# Patient Record
Sex: Male | Born: 1955
Health system: Southern US, Community
[De-identification: ages and names within clinical notes are randomized; demographics above are authoritative.]

## PROBLEM LIST (undated history)

## (undated) DIAGNOSIS — E119 Type 2 diabetes mellitus without complications: Secondary | ICD-10-CM

## (undated) DIAGNOSIS — I10 Essential (primary) hypertension: Secondary | ICD-10-CM

## (undated) DIAGNOSIS — E785 Hyperlipidemia, unspecified: Secondary | ICD-10-CM

---

## 1993-10-16 DIAGNOSIS — E119 Type 2 diabetes mellitus without complications: Secondary | ICD-10-CM

## 1993-10-16 HISTORY — DX: Type 2 diabetes mellitus without complications: E11.9

## 2001-03-01 ENCOUNTER — Emergency Department (HOSPITAL_COMMUNITY): Admission: EM | Admit: 2001-03-01 | Discharge: 2001-03-01 | Payer: Self-pay | Admitting: Emergency Medicine

## 2001-06-17 ENCOUNTER — Emergency Department (HOSPITAL_COMMUNITY): Admission: EM | Admit: 2001-06-17 | Discharge: 2001-06-17 | Payer: Self-pay | Admitting: Emergency Medicine

## 2001-06-17 ENCOUNTER — Encounter: Payer: Self-pay | Admitting: Emergency Medicine

## 2002-08-08 ENCOUNTER — Encounter: Payer: Self-pay | Admitting: Internal Medicine

## 2002-08-08 ENCOUNTER — Ambulatory Visit (HOSPITAL_COMMUNITY): Admission: RE | Admit: 2002-08-08 | Discharge: 2002-08-08 | Payer: Self-pay | Admitting: Internal Medicine

## 2005-10-16 HISTORY — PX: CHOLECYSTECTOMY: SHX55

## 2006-02-12 ENCOUNTER — Emergency Department (HOSPITAL_COMMUNITY): Admission: EM | Admit: 2006-02-12 | Discharge: 2006-02-12 | Payer: Self-pay | Admitting: Emergency Medicine

## 2008-10-16 DIAGNOSIS — I10 Essential (primary) hypertension: Secondary | ICD-10-CM

## 2008-10-16 HISTORY — DX: Essential (primary) hypertension: I10

## 2010-01-03 ENCOUNTER — Encounter: Admission: RE | Admit: 2010-01-03 | Discharge: 2010-04-03 | Payer: Self-pay | Admitting: Internal Medicine

## 2011-10-17 DIAGNOSIS — E785 Hyperlipidemia, unspecified: Secondary | ICD-10-CM

## 2011-10-17 HISTORY — PX: EYE SURGERY: SHX253

## 2011-10-17 HISTORY — DX: Hyperlipidemia, unspecified: E78.5

## 2012-07-25 ENCOUNTER — Encounter (HOSPITAL_COMMUNITY)
Admission: RE | Admit: 2012-07-25 | Discharge: 2012-07-25 | Disposition: A | Discharge status: Home / Self Care | Payer: 59 | Source: Ambulatory Visit | Attending: Ophthalmology | Admitting: Ophthalmology

## 2012-07-25 ENCOUNTER — Encounter (HOSPITAL_COMMUNITY): Payer: Self-pay

## 2012-07-25 HISTORY — DX: Hyperlipidemia, unspecified: E78.5

## 2012-07-25 HISTORY — DX: Essential (primary) hypertension: I10

## 2012-07-25 HISTORY — DX: Type 2 diabetes mellitus without complications: E11.9

## 2012-07-25 LAB — BASIC METABOLIC PANEL
CO2: 28 mEq/L (ref 19–32)
Chloride: 99 mEq/L (ref 96–112)
Glucose, Bld: 120 mg/dL — ABNORMAL HIGH (ref 70–99)
Sodium: 137 mEq/L (ref 135–145)

## 2012-07-25 LAB — HEMOGLOBIN AND HEMATOCRIT, BLOOD: HCT: 37.9 % — ABNORMAL LOW (ref 39.0–52.0)

## 2012-07-25 NOTE — Patient Instructions (Addendum)
20 Travis Herrera  07/25/2012   Your procedure is scheduled on:  Monday,October 14  Report to [facility] [id number] AM.  Call this number if you have problems the morning of surgery: [phone]   Remember:   Do not eat food:After Midnight.  May have clear liquids:until Midnight .  Clear liquids include soda, tea, black coffee, apple or grape juice, broth.  Take these medicines the morning of surgery with A SIP OF WATER: Norvasc,Benazepril,Hydrochorothiazide   Do not wear jewelry, make-up or nail polish.  Do not wear lotions, powders, or perfumes. You may wear deodorant.  Do not shave 48 hours prior to surgery. Men may shave face and neck.  Do not bring valuables to the hospital.  Contacts, dentures or bridgework may not be worn into surgery.  Leave suitcase in the car. After surgery it may be brought to your room.  For patients admitted to the hospital, checkout time is 11:00 AM the day of discharge.   Patients discharged the day of surgery will not be allowed to drive home.  Name and phone number of your driver: Family and/or friend  Special Instructions: Shower using CHG 2 nights before surgery and the night before surgery.  If you shower the day of surgery use CHG.  Use special wash - you have one bottle of CHG for all showers.  You should use approximately 1/3 of the bottle for each shower.   Please read over the following fact sheets that you were given: Pain Booklet, Coughing and Deep Breathing, MRSA Information, Surgical Site Infection Prevention, Anesthesia Post-op Instructions and Care and Recovery After Surgery  PATIENT INSTRUCTIONS POST-ANESTHESIA  IMMEDIATELY FOLLOWING SURGERY:  Do not drive or operate machinery for the first twenty four hours after surgery.  Do not make any important decisions for twenty four hours after surgery or while taking narcotic pain medications or sedatives.  If you develop intractable nausea and vomiting or a severe headache please notify your doctor  immediately.  FOLLOW-UP:  Please make an appointment with your surgeon as instructed. You do not need to follow up with anesthesia unless specifically instructed to do so.  WOUND CARE INSTRUCTIONS (if applicable):  Keep a dry clean dressing on the anesthesia/puncture wound site if there is drainage.  Once the wound has quit draining you may leave it open to air.  Generally you should leave the bandage intact for twenty four hours unless there is drainage.  If the epidural site drains for more than 36-48 hours please call the anesthesia department.  QUESTIONS?:  Please feel free to call your physician or the hospital operator if you have any questions, and they will be happy to assist you.      Cataract A cataract is a clouding of the lens of the eye. When a lens becomes cloudy, vision is reduced based on the degree and nature of the clouding. Many cataracts reduce vision to some degree. Some cataracts make people more near-sighted as they develop. Other cataracts increase glare. Cataracts that are ignored and become worse can sometimes look white. The white color can be seen through the pupil. CAUSES   Aging. However, cataracts may occur at any age, even in newborns.  Certain drugs.  Trauma to the eye.  Certain diseases such as diabetes.  Specific eye diseases such as chronic inflammation inside the eye or a sudden attack of a rare form of glaucoma.  Inherited or acquired medical problems. SYMPTOMS   Gradual, progressive drop in vision in the  affected eye.  Severe, rapid visual loss. This most often happens when trauma is the cause. DIAGNOSIS  To detect a cataract, an eye doctor examines the lens. Cataracts are best diagnosed with an exam of the eyes with the pupils enlarged (dilated) by drops.  TREATMENT  For an early cataract, vision may improve by using different eyeglasses or stronger lighting. If that does not help your vision, surgery is the only effective treatment. A cataract  needs to be surgically removed when vision loss interferes with your everyday activities, such as driving, reading, or watching TV. A cataract may also have to be removed if it prevents examination or treatment of another eye problem. Surgery removes the cloudy lens and usually replaces it with a substitute lens (intraocular lens, IOL).  At a time when both you and your doctor agree, the cataract will be surgically removed. If you have cataracts in both eyes, only one is usually removed at a time. This allows the operated eye to heal and be out of danger from any possible problems after surgery (such as infection or poor wound healing). In rare cases, a cataract may be doing damage to your eye. In these cases, your caregiver may advise surgical removal right away. The vast majority of people who have cataract surgery have better vision afterward. HOME CARE INSTRUCTIONS  If you are not planning surgery, you may be asked to do the following:  Use different eyeglasses.  Use stronger or brighter lighting.  Ask your eye doctor about reducing your medicine dose or changing medicines if it is thought that a medicine caused your cataract. Changing medicines does not make the cataract go away on its own.  Become familiar with your surroundings. Poor vision can lead to injury. Avoid bumping into things on the affected side. You are at a higher risk for tripping or falling.  Exercise extreme care when driving or operating machinery.  Wear sunglasses if you are sensitive to bright light or experiencing problems with glare. SEEK IMMEDIATE MEDICAL CARE IF:   You have a worsening or sudden vision loss.  You notice redness, swelling, or increasing pain in the eye.  You have a fever.

## 2012-07-26 MED ORDER — LIDOCAINE HCL (PF) 1 % IJ SOLN
INTRAMUSCULAR | Status: AC
  Filled 2012-07-26: qty 2

## 2012-07-26 MED ORDER — NEOMYCIN-POLYMYXIN-DEXAMETH 3.5-10000-0.1 OP OINT
TOPICAL_OINTMENT | OPHTHALMIC | Status: AC
  Filled 2012-07-26: qty 3.5

## 2012-07-26 MED ORDER — TETRACAINE HCL 0.5 % OP SOLN
OPHTHALMIC | Status: AC
  Filled 2012-07-26: qty 2

## 2012-07-26 MED ORDER — PHENYLEPHRINE HCL 2.5 % OP SOLN
OPHTHALMIC | Status: AC
  Filled 2012-07-26: qty 2

## 2012-07-26 MED ORDER — LIDOCAINE HCL 3.5 % OP GEL
OPHTHALMIC | Status: AC
  Filled 2012-07-26: qty 5

## 2012-07-26 MED ORDER — CYCLOPENTOLATE HCL 1 % OP SOLN
OPHTHALMIC | Status: AC
  Filled 2012-07-26: qty 2

## 2012-07-29 ENCOUNTER — Encounter (HOSPITAL_COMMUNITY): Payer: Self-pay | Admitting: *Deleted

## 2012-07-29 ENCOUNTER — Ambulatory Visit (HOSPITAL_COMMUNITY)
Admission: RE | Admit: 2012-07-29 | Discharge: 2012-07-29 | Disposition: A | Discharge status: Home / Self Care | Payer: 59 | Source: Ambulatory Visit | Attending: Ophthalmology | Admitting: Ophthalmology

## 2012-07-29 ENCOUNTER — Encounter (HOSPITAL_COMMUNITY): Payer: Self-pay | Admitting: Anesthesiology

## 2012-07-29 ENCOUNTER — Ambulatory Visit (HOSPITAL_COMMUNITY): Payer: 59 | Admitting: Anesthesiology

## 2012-07-29 ENCOUNTER — Encounter (HOSPITAL_COMMUNITY)
Admission: RE | Disposition: A | Discharge status: Home / Self Care | Payer: Self-pay | Source: Ambulatory Visit | Attending: Ophthalmology

## 2012-07-29 DIAGNOSIS — Z794 Long term (current) use of insulin: Secondary | ICD-10-CM | POA: Insufficient documentation

## 2012-07-29 DIAGNOSIS — I10 Essential (primary) hypertension: Secondary | ICD-10-CM | POA: Insufficient documentation

## 2012-07-29 DIAGNOSIS — Z01810 Encounter for preprocedural cardiovascular examination: Secondary | ICD-10-CM | POA: Insufficient documentation

## 2012-07-29 DIAGNOSIS — Z01812 Encounter for preprocedural laboratory examination: Secondary | ICD-10-CM | POA: Insufficient documentation

## 2012-07-29 DIAGNOSIS — E119 Type 2 diabetes mellitus without complications: Secondary | ICD-10-CM | POA: Insufficient documentation

## 2012-07-29 DIAGNOSIS — H2589 Other age-related cataract: Secondary | ICD-10-CM | POA: Insufficient documentation

## 2012-07-29 HISTORY — PX: CATARACT EXTRACTION W/PHACO: SHX586

## 2012-07-29 LAB — GLUCOSE, CAPILLARY: Glucose-Capillary: 160 mg/dL — ABNORMAL HIGH (ref 70–99)

## 2012-07-29 SURGERY — PHACOEMULSIFICATION, CATARACT, WITH IOL INSERTION
Anesthesia: Monitor Anesthesia Care | Site: Eye | Laterality: Left | Wound class: Clean

## 2012-07-29 MED ORDER — BSS IO SOLN
INTRAOCULAR | Status: DC | PRN
  Administered 2012-07-29: 15 mL via INTRAOCULAR

## 2012-07-29 MED ORDER — PROVISC 10 MG/ML IO SOLN
INTRAOCULAR | Status: DC | PRN
  Administered 2012-07-29: 8.5 mg via INTRAOCULAR

## 2012-07-29 MED ORDER — MIDAZOLAM HCL 2 MG/2ML IJ SOLN
1.0000 mg | INTRAMUSCULAR | Status: DC | PRN
  Administered 2012-07-29: 2 mg via INTRAVENOUS

## 2012-07-29 MED ORDER — ONDANSETRON HCL 4 MG/2ML IJ SOLN: 4.0000 mg | Freq: Once | INTRAMUSCULAR | Status: DC | PRN

## 2012-07-29 MED ORDER — LIDOCAINE HCL (PF) 1 % IJ SOLN
INTRAMUSCULAR | Status: DC | PRN
  Administered 2012-07-29: .4 mL

## 2012-07-29 MED ORDER — LIDOCAINE HCL 3.5 % OP GEL
1.0000 "application " | Freq: Once | OPHTHALMIC | Status: AC
  Administered 2012-07-29: 1 via OPHTHALMIC

## 2012-07-29 MED ORDER — MIDAZOLAM HCL 2 MG/2ML IJ SOLN
INTRAMUSCULAR | Status: AC
  Filled 2012-07-29: qty 2

## 2012-07-29 MED ORDER — FENTANYL CITRATE 0.05 MG/ML IJ SOLN: 25.0000 ug | INTRAMUSCULAR | Status: DC | PRN

## 2012-07-29 MED ORDER — CYCLOPENTOLATE-PHENYLEPHRINE 0.2-1 % OP SOLN: 1.0000 [drp] | OPHTHALMIC | Status: DC

## 2012-07-29 MED ORDER — TETRACAINE HCL 0.5 % OP SOLN
1.0000 [drp] | OPHTHALMIC | Status: AC
  Administered 2012-07-29 (×3): 1 [drp] via OPHTHALMIC

## 2012-07-29 MED ORDER — LACTATED RINGERS IV SOLN
INTRAVENOUS | Status: DC
  Administered 2012-07-29: 1000 mL via INTRAVENOUS

## 2012-07-29 MED ORDER — EPINEPHRINE HCL 1 MG/ML IJ SOLN
INTRAOCULAR | Status: DC | PRN
  Administered 2012-07-29: 09:00:00

## 2012-07-29 MED ORDER — CYCLOPENTOLATE HCL 1 % OP SOLN
1.0000 [drp] | OPHTHALMIC | Status: AC
  Administered 2012-07-29 (×3): 1 [drp] via OPHTHALMIC

## 2012-07-29 MED ORDER — POVIDONE-IODINE 5 % OP SOLN
OPHTHALMIC | Status: DC | PRN
  Administered 2012-07-29: 1 via OPHTHALMIC

## 2012-07-29 MED ORDER — MIDAZOLAM HCL 5 MG/5ML IJ SOLN
INTRAMUSCULAR | Status: DC | PRN
  Administered 2012-07-29: 2 mg via INTRAVENOUS

## 2012-07-29 MED ORDER — NEOMYCIN-POLYMYXIN-DEXAMETH 0.1 % OP OINT
TOPICAL_OINTMENT | OPHTHALMIC | Status: DC | PRN
  Administered 2012-07-29: 1 via OPHTHALMIC

## 2012-07-29 MED ORDER — PHENYLEPHRINE HCL 2.5 % OP SOLN
1.0000 [drp] | Freq: Once | OPHTHALMIC | Status: AC
  Administered 2012-07-29: 1 [drp] via OPHTHALMIC

## 2012-07-29 MED ORDER — LACTATED RINGERS IV SOLN
INTRAVENOUS | Status: DC | PRN
  Administered 2012-07-29: 08:00:00 via INTRAVENOUS

## 2012-07-29 SURGICAL SUPPLY — 11 items
CLOTH BEACON ORANGE TIMEOUT ST (SAFETY) ×2 IMPLANT
EYE SHIELD UNIVERSAL CLEAR (GAUZE/BANDAGES/DRESSINGS) ×2 IMPLANT
GLOVE BIOGEL PI IND STRL 6.5 (GLOVE) ×1 IMPLANT
GLOVE BIOGEL PI INDICATOR 6.5 (GLOVE) ×1
GLOVE EXAM NITRILE MD LF STRL (GLOVE) ×2 IMPLANT
PAD ARMBOARD 7.5X6 YLW CONV (MISCELLANEOUS) ×2 IMPLANT
SIGHTPATH CAT PROC W REG LENS (Ophthalmic Related) ×2 IMPLANT
SYR TB 1ML LL NO SAFETY (SYRINGE) ×2 IMPLANT
TAPE SURG TRANSPORE 1 IN (GAUZE/BANDAGES/DRESSINGS) ×1 IMPLANT
TAPE SURGICAL TRANSPORE 1 IN (GAUZE/BANDAGES/DRESSINGS) ×1
WATER STERILE IRR 250ML POUR (IV SOLUTION) ×2 IMPLANT

## 2012-07-29 NOTE — Transfer of Care (Signed)
  Anesthesia Post-op Note  Patient: Travis Herrera  Procedure(s) Performed: Procedure(s) (LRB) with comments: CATARACT EXTRACTION PHACO AND INTRAOCULAR LENS PLACEMENT (IOC) (Left) - CDE=1.66  Patient Location: Short stay  Anesthesia Type: MAC  Level of Consciousness: awake, alert , oriented and patient cooperative  Airway and Oxygen Therapy: Patient Spontanous Breathing  Post-op Pain: none  Post-op Assessment: Post-op Vital signs reviewed, Patient's Cardiovascular Status Stable, Respiratory Function Stable, Patent Airway, No signs of Nausea or vomiting and Pain level controlled  Post-op Vital Signs: Reviewed and stable  Complications: No apparent anesthesia complications

## 2012-07-29 NOTE — Anesthesia Procedure Notes (Signed)
Procedure Name: MAC Performed by: ANDRAZA, AMY L Pre-anesthesia Checklist: Patient identified, Patient being monitored, Emergency Drugs available, Timeout performed and Suction available Oxygen Delivery Method: Nasal cannula     

## 2012-07-29 NOTE — Anesthesia Postprocedure Evaluation (Signed)
  Anesthesia Post-op Note  Patient: Travis Herrera  Procedure(s) Performed: Procedure(s) (LRB) with comments: CATARACT EXTRACTION PHACO AND INTRAOCULAR LENS PLACEMENT (IOC) (Left) - CDE=1.66  Patient Location: Short stay  Anesthesia Type: MAC  Level of Consciousness: awake, alert , oriented and patient cooperative  Airway and Oxygen Therapy: Patient Spontanous Breathing  Post-op Pain: none  Post-op Assessment: Post-op Vital signs reviewed, Patient's Cardiovascular Status Stable, Respiratory Function Stable, Patent Airway, No signs of Nausea or vomiting and Pain level controlled  Post-op Vital Signs: Reviewed and stable  Complications: No apparent anesthesia complications  

## 2012-07-29 NOTE — Preoperative (Signed)
Beta Blockers   Reason not to administer Beta Blockers:Not Applicable 

## 2012-07-29 NOTE — Op Note (Signed)
NAMEEBERARDO, DEMELLO NO.:  [id number]  MEDICAL RECORD NO.:  [id number]  LOCATION:  [facility]                          FACILITY:  [facility]  PHYSICIAN:  Susanne Greenhouse, MD       DATE OF BIRTH:  11/15/1955  DATE OF PROCEDURE:  07/29/2012 DATE OF DISCHARGE:  07/29/2012                              OPERATIVE REPORT   PREOPERATIVE DIAGNOSIS:  Combined cataract, left eye, diagnosis code 366.19.  POSTOPERATIVE DIAGNOSIS:  Combined cataract, left eye, diagnosis code 366.19.  OPERATION PERFORMED:  Phacoemulsification with posterior chamber intraocular lens implantation, left eye.  SURGEON:  Bonne Dolores. Reene Harlacher, MD  ANESTHESIA:  Topical with IV sedation.  OPERATIVE SUMMARY:  In the preoperative area, dilating drops were placed into the left eye.  The patient was then brought into the operating room where he was placed under general anesthesia.  The eye was then prepped and draped.  Beginning with a 75 blade, a paracentesis port was made at the surgeon's 2 o'clock position.  The anterior chamber was then filled with a 1% nonpreserved lidocaine solution with epinephrine.  This was followed by Viscoat to deepen the chamber.  A small fornix-based peritomy was performed superiorly.  Next, a single iris hook was placed through the limbus superiorly.  A 2.4-mm keratome blade was then used to make a clear corneal incision over the iris hook.  A bent cystotome needle and Utrata forceps were used to create a continuous tear capsulotomy.  Hydrodissection was performed using balanced salt solution on a fine cannula.  The lens nucleus was then removed using phacoemulsification in a quadrant cracking technique.  The cortical material was then removed with irrigation and aspiration.  The capsular bag and anterior chamber were refilled with Provisc.  The wound was widened to approximately 3 mm and a posterior chamber intraocular lens was placed into the capsular bag without difficulty using an  [employer] lens injecting system.  A single 10-0 nylon suture was then used to close the incision as well as stromal hydration.  The Provisc was removed from the anterior chamber and capsular bag with irrigation and aspiration.  At this point, the wounds were tested for leak, which were negative.  The anterior chamber remained deep and stable.  The patient tolerated the procedure well.  There were no operative complications, and he awoke from general anesthesia without problem.  No surgical specimens.  Prosthetic device used is a Actuary enVista posterior chamber lens, model MX60, power of 17.0, serial number is [id number].          ______________________________ Susanne Greenhouse, MD     KEH/MEDQ  D:  07/29/2012  T:  07/29/2012  Job:  [id number]

## 2012-07-29 NOTE — H&P (Signed)
I have reviewed the H&P, the patient was re-examined, and I have identified no interval changes in medical condition and plan of care since the history and physical of record  

## 2012-07-29 NOTE — Anesthesia Preprocedure Evaluation (Signed)
Anesthesia Evaluation  Patient identified by MRN, date of birth, ID band Patient awake    Reviewed: Allergy & Precautions, H&P , NPO status , Patient's Chart, lab work & pertinent test results  History of Anesthesia Complications Negative for: history of anesthetic complications  Airway Mallampati: II      Dental  (+) Teeth Intact   Pulmonary neg pulmonary ROS,  breath sounds clear to auscultation        Cardiovascular hypertension, Pt. on medications Rhythm:Regular     Neuro/Psych    GI/Hepatic   Endo/Other  diabetes, Well Controlled, Type 2, Insulin Dependent  Renal/GU      Musculoskeletal   Abdominal   Peds  Hematology   Anesthesia Other Findings   Reproductive/Obstetrics                           Anesthesia Physical Anesthesia Plan  ASA: III  Anesthesia Plan: MAC   Post-op Pain Management:    Induction: Intravenous  Airway Management Planned: Nasal Cannula  Additional Equipment:   Intra-op Plan:   Post-operative Plan:   Informed Consent: I have reviewed the patients History and Physical, chart, labs and discussed the procedure including the risks, benefits and alternatives for the proposed anesthesia with the patient or authorized representative who has indicated his/her understanding and acceptance.     Plan Discussed with:   Anesthesia Plan Comments:         Anesthesia Quick Evaluation

## 2012-07-29 NOTE — Brief Op Note (Signed)
Pre-Op Dx: Cataract OS Post-Op Dx: Cataract OS Surgeon: Linsy Ehresman Anesthesia: Topical with MAC Surgery: Cataract Extraction with Intraocular lens Implant OS Implant: B&L enVista Specimen: None Complications: None 

## 2012-08-01 ENCOUNTER — Encounter (HOSPITAL_COMMUNITY): Payer: Self-pay | Admitting: Ophthalmology

## 2013-06-12 ENCOUNTER — Encounter: Payer: Self-pay | Admitting: Orthopedic Surgery

## 2013-06-12 ENCOUNTER — Ambulatory Visit: Payer: 59

## 2013-06-12 ENCOUNTER — Ambulatory Visit (INDEPENDENT_AMBULATORY_CARE_PROVIDER_SITE_OTHER): Payer: 59 | Admitting: Orthopedic Surgery

## 2013-06-12 ENCOUNTER — Ambulatory Visit (INDEPENDENT_AMBULATORY_CARE_PROVIDER_SITE_OTHER): Payer: 59

## 2013-06-12 VITALS — BP 125/80 | Ht 69.0 in | Wt 213.0 lb

## 2013-06-12 DIAGNOSIS — M79609 Pain in unspecified limb: Secondary | ICD-10-CM

## 2013-06-12 DIAGNOSIS — M722 Plantar fascial fibromatosis: Secondary | ICD-10-CM

## 2013-06-12 DIAGNOSIS — M79672 Pain in left foot: Secondary | ICD-10-CM

## 2013-06-12 NOTE — Progress Notes (Signed)
Subjective:     Patient ID: Travis Herrera, male   DOB: 1955/11/11, 57 y.o.   MRN: [id number]  Foot Pain   Chief Complaint  Patient presents with  . Foot Pain    Left heel pain     HISTORY: 57 year old male with a seven-year history of left heel pain treated 7 years ago successfully presents now with plantar heel pain despite home exercises stretching ice and over-the-counter medications. His pain is 9/10 sharp stabbing and is causing him to limp it came on gradually without trauma. The pain has now become constant  He has diabetes and hypertension takes medications for that his review of systems is negative  His vital signs are stable   Review of Systems     Objective:   Physical Exam BP 125/80  Ht 5\' 9"  (1.753 m)  Wt 213 lb (96.616 kg)  BMI 31.44 kg/m2 His appearance is normal he is oriented x3 his mood is normal his gait is associated with a limp he has tenderness in the plantar fascia ankle range of motion is normal he exhibits normal stability in the ankle plantar flexion dorsiflexion strength remains intact and normal with no foot atrophy scans intact good pulse normal sensation    Assessment:     Plantar fasciitis with plantar spur on the x-ray that was ordered today see report for details    Plan:     Continue current treatment Night splint for 6 weeks Injection  Procedure injection plantar fascia  Verbal consent was obtained   Time out completed   The left foot was injected  Under sterile conditions the plantar fascia  was injected with Depomedrol 40 mg / ml (1 ml) and lidocaine 1% (4 ml)  There were no complications

## 2013-07-24 ENCOUNTER — Encounter: Payer: Self-pay | Admitting: Orthopedic Surgery

## 2013-07-24 ENCOUNTER — Ambulatory Visit: Payer: 59 | Admitting: Orthopedic Surgery

## 2013-12-08 ENCOUNTER — Ambulatory Visit (INDEPENDENT_AMBULATORY_CARE_PROVIDER_SITE_OTHER): Payer: Self-pay | Admitting: Family Medicine

## 2013-12-08 VITALS — Ht 70.0 in | Wt 210.0 lb

## 2013-12-08 DIAGNOSIS — E1122 Type 2 diabetes mellitus with diabetic chronic kidney disease: Secondary | ICD-10-CM | POA: Insufficient documentation

## 2013-12-08 DIAGNOSIS — N182 Chronic kidney disease, stage 2 (mild): Secondary | ICD-10-CM

## 2013-12-08 DIAGNOSIS — E119 Type 2 diabetes mellitus without complications: Secondary | ICD-10-CM

## 2013-12-08 DIAGNOSIS — Z794 Long term (current) use of insulin: Secondary | ICD-10-CM

## 2013-12-08 NOTE — Progress Notes (Signed)
Subjective:  Patient presents today for an annual pharmacy visit as part of the employer-sponsored [employer] [facility] program. Current diabetes regimen includes Victoza 1.8 mg SQ daily, metformin 1000 mg BID and Lantus 20 units SQ once daily. Patient also continues on daily ASA, ACEi, and statin.   Patient has an appointment tomorrow for lab work including A1C and an appointment with PCP on Friday. I will defer A1C testing today.  Per patient report- fasting blood sugars are running 120-140s bedtime checks are around 200.   Patient is taking fish oil.   Patient reports that he isn't eating as much as he used to eat. He thinks this is because of the Victoza. He reports his appetite has dropped. He reports his home weight as 202 lb. Today in the office it was 210 lb.    Disease Assessments:  Diabetes:  Type of Diabetes: Type 2; Sees Diabetes provider 3 times per year; MD managing Diabetes Nida; checks feet daily; uses glucometer; takes medications as prescribed; takes an aspirin a day; Current Diabetes related medical conditions are High blood pressure, High cholesterol;   Highest CBG 210; Lowest CBG 70; checks blood glucose 2 times a day; hypoglycemia frequency rarely.;   Other Diabetes History: Patient reports the highest reading he has gotten lately is 210. Lowest- 70. He reported that this was in the middle of the night and he felt shaky. He reports he is checking twice daily.   He reports fasting blood sugar in the 120-140s, bedtime checks around 200.   Nutrition-  Appetite has decreased and he reports eating smaller meals.   Typical Day  B- [place] sausage biscuit and fruit from cafeteria (cantaloupe or grape).   L- plain hot dog, cabbage, small piece of cornbread, lima beans or peas,; sometimes grilled chicken with LT.   D- last night- chicken salad, carrots, crackers. Normally he states it would have been more than that. Sometimes salisbury steak, green beans, sometimes bread.    Physical Activity-  Walking while at work. 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   He has two foster kids aged 90 and 65. He goes with them somewhere each night- walking around the mall.   Assessment/Plan: Patient is a 58 year old male with DM2. Most recent A1C was 8.1% and was above goal of less than 7%. He has an appointment pending this week with his PCP for A1C draw and a visit, so I did not check A1C today.   Patient has been consistently taking medications and reports that his appetite has also decreased. 24 hour food recall shows that patient is usually getting no more than 3-4 servings of carbohydrates with each meal. Reviewed with patient what foods contained carbohydrates and how to estimate a portion size.   Patient is getting some physical activity. He is walking while he is at work and and is also walking some in the evenings with his two foster children. I explained to him that exercise and regular physical activity will work to lower his blood sugar. Patient states that he would like his A1C to be lower. His original goal was to be around 5%. I explained that a reasonable goal for him would be to get his A1C to be less than 7%. Based on his self reported fasting CBGs I expect that his A1C has dropped from 8% but it may not be less than 7%.   Patient  will follow up with Kelli Churn, RNCM, CDE.  Marland Kitchen    Goals for Next Visit-  1. Work on lowering blood sugar. Possible ways to do this- increase physical activity. Aim to get at least 4-5 days of physical activity each week.  2. Keep taking your medication.  Marcie Bal will contact you for your next appointment.

## 2014-01-05 ENCOUNTER — Telehealth: Payer: Self-pay | Admitting: Family Medicine

## 2014-01-06 NOTE — Telephone Encounter (Signed)
Ok as new wHEN available, not before May, pls klet him know and schedule if he stll wants that please Annalee Genta Pinnix is his Dad)

## 2014-03-10 ENCOUNTER — Ambulatory Visit: Payer: 59 | Admitting: Family Medicine

## 2014-04-21 ENCOUNTER — Ambulatory Visit (INDEPENDENT_AMBULATORY_CARE_PROVIDER_SITE_OTHER): Payer: 59 | Admitting: Family Medicine

## 2014-04-21 ENCOUNTER — Encounter (INDEPENDENT_AMBULATORY_CARE_PROVIDER_SITE_OTHER): Payer: Self-pay

## 2014-04-21 ENCOUNTER — Encounter: Payer: Self-pay | Admitting: Family Medicine

## 2014-04-21 VITALS — BP 120/82 | HR 76 | Resp 18 | Ht 70.0 in | Wt 212.0 lb

## 2014-04-21 DIAGNOSIS — Z794 Long term (current) use of insulin: Secondary | ICD-10-CM

## 2014-04-21 DIAGNOSIS — E669 Obesity, unspecified: Secondary | ICD-10-CM

## 2014-04-21 DIAGNOSIS — I10 Essential (primary) hypertension: Secondary | ICD-10-CM

## 2014-04-21 DIAGNOSIS — E785 Hyperlipidemia, unspecified: Secondary | ICD-10-CM

## 2014-04-21 DIAGNOSIS — IMO0001 Reserved for inherently not codable concepts without codable children: Secondary | ICD-10-CM

## 2014-04-21 DIAGNOSIS — E119 Type 2 diabetes mellitus without complications: Secondary | ICD-10-CM

## 2014-04-21 MED ORDER — HYDROCHLOROTHIAZIDE 25 MG PO TABS: 25.0000 mg | ORAL_TABLET | Freq: Every day | ORAL | Status: DC

## 2014-04-21 MED ORDER — ROSUVASTATIN CALCIUM 10 MG PO TABS: 10.0000 mg | ORAL_TABLET | Freq: Every day | ORAL | Status: DC

## 2014-04-21 MED ORDER — AMLODIPINE BESYLATE 10 MG PO TABS
10.0000 mg | ORAL_TABLET | Freq: Every day | ORAL | Status: DC
Start: 2014-04-21 — End: 2014-08-03

## 2014-04-21 NOTE — Progress Notes (Signed)
   Subjective:    Patient ID: Travis Herrera, male    DOB: Jul 20, 1956, 58 y.o.   MRN: [id number]  HPI New patient evalaution for pt married 2 years father of 2 adult cghildren, and now adopting 2 children ages 70 and 5  Chronic health problems are HTN diabetes and IDDM . He also is obese and ic concerned about this actually requested medication to help but is willing to work on lifestyle change which is needed Denies polyuria, polydipsia , blurred vision or hypoglycemic episodes, blood sugar control has deteriorated in recent past but states now improved  Review of Systems See HPI Denies recent fever or chills. Denies sinus pressure, nasal congestion, ear pain or sore throat. Denies chest congestion, productive cough or wheezing. Denies chest pains, palpitations and leg swelling Denies abdominal pain, nausea, vomiting,diarrhea or constipation.   Denies dysuria, frequency, hesitancy or incontinence. Denies joint pain, swelling and limitation in mobility. Denies headaches, seizures, numbness, or tingling. Denies uncontrolled  depression, anxiety or insomnia.Recently unexpectedly lost his brother which made him depressed howeevr finally broke down, obtained help from spitual advisor on the job with resolution Denies skin break down or rash.        Objective:   Physical Exam BP 120/82  Pulse 76  Resp 18  Ht 5\' 10"  (1.778 m)  Wt 212 lb (96.163 kg)  BMI 30.42 kg/m2  SpO2 99% Patient alert and oriented and in no cardiopulmonary distress.  HEENT: No facial asymmetry, EOMI,   oropharynx pink and moist.  Neck supple no JVD, no mass.  Chest: Clear to auscultation bilaterally.  CVS: S1, S2 no murmurs, no S3.Regular rate.  ABD: Soft non tender.   Ext: No edema  MS: Adequate ROM spine, shoulders, hips and knees.  Skin: Intact, no ulcerations or rash noted.  Psych: Good eye contact, normal affect. Memory intact not anxious or depressed appearing.  CNS: CN 2-12 intact, power,   normal throughout.no focal deficits noted.        Assessment & Plan:  IDDM (insulin dependent diabetes mellitus) Patient advised to reduce carb and sweets, commit to regular physical activity, take meds as prescribed, test blood as directed, and attempt to lose weight, to improve blood sugar control. Updated lab needed  Will send for this from endo   HTN, goal below 130/80 Controlled, no change in medication DASH diet and commitment to daily physical activity for a minimum of 30 minutes discussed and encouraged, as a part of hypertension management. The importance of attaining a healthy weight is also discussed.   Hyperlipidemia LDL goal <100 Hyperlipidemia:Low fat diet discussed and encouraged.  Updated lab needed at/ before next visit.   Obesity (BMI 30.0-34.9)  Patient educated about  the importance of commitment to a  minimum of 150 minutes of exercise per week. The importance of healthy food choices with portion control discussed. Encouraged to start a food diary, count calories and to consider  joining a support group. Sample diet sheets offered. Goals set by the patient for the next several months.

## 2014-04-21 NOTE — Patient Instructions (Addendum)
F/u in early December, please call if you need me before  Blood pressure is excellent, we will refill blood pressure and cholesterol medications for 6 month  Pls sign for colonoscopy report to be sent from Dr Rehman/[place], also pls request that eye exam be sent here when you have it done  Foot exam today is normal    You DO need the prevnar vaccine which is an new and moore effective pneumonia vaccine, pls let us kniow when you decide to have this  It is important that you exercise regularly at least 30 minutes 5 times a week. If you develop chest pain, have severe difficulty breathing, or feel very tired, stop exercising immediately and seek medical attention   A healthy diet is rich in fruit, vegetables and whole grains. Poultry fish, nuts and beans are a healthy choice for protein rather then red meat. A low sodium diet and drinking 64 ounces of water daily is generally recommended. Oils and sweet should be limited. Carbohydrates especially for those who are diabetic or overweight, should be limited to 45 to 60 gram per meal. It is important to eat on a regular schedule, at least 3 times daily. Snacks should be primarily fruits, vegetables or nuts.  WEIGHT LOSS GOAL OF 1.5 TO 2 POUNDS PER MONTH

## 2014-05-03 DIAGNOSIS — I10 Essential (primary) hypertension: Secondary | ICD-10-CM | POA: Insufficient documentation

## 2014-05-03 DIAGNOSIS — E669 Obesity, unspecified: Secondary | ICD-10-CM | POA: Insufficient documentation

## 2014-05-03 DIAGNOSIS — E66811 Obesity, class 1: Secondary | ICD-10-CM | POA: Insufficient documentation

## 2014-05-03 DIAGNOSIS — E782 Mixed hyperlipidemia: Secondary | ICD-10-CM | POA: Insufficient documentation

## 2014-05-03 NOTE — Assessment & Plan Note (Signed)
Patient advised to reduce carb and sweets, commit to regular physical activity, take meds as prescribed, test blood as directed, and attempt to lose weight, to improve blood sugar control. Updated lab needed  Will send for this from endo

## 2014-05-03 NOTE — Assessment & Plan Note (Signed)
Hyperlipidemia:Low fat diet discussed and encouraged.  Updated lab needed at/ before next visit.  

## 2014-05-03 NOTE — Assessment & Plan Note (Signed)
.   Patient educated about  the importance of commitment to a  minimum of 150 minutes of exercise per week. The importance of healthy food choices with portion control discussed. Encouraged to start a food diary, count calories and to consider  joining a support group. Sample diet sheets offered. Goals set by the patient for the next several months.    

## 2014-05-03 NOTE — Assessment & Plan Note (Signed)
Controlled, no change in medication DASH diet and commitment to daily physical activity for a minimum of 30 minutes discussed and encouraged, as a part of hypertension management. The importance of attaining a healthy weight is also discussed.  

## 2014-05-18 ENCOUNTER — Encounter: Payer: Self-pay | Admitting: Family Medicine

## 2014-05-18 NOTE — Progress Notes (Signed)
Patient ID: Travis Herrera, male   DOB: Apr 28, 1956, 58 y.o.   MRN: [id number] Reviewed: Agree with our Pharmacologist's documentation and management.

## 2014-07-16 ENCOUNTER — Other Ambulatory Visit: Payer: Self-pay

## 2014-07-16 MED ORDER — BENAZEPRIL HCL 20 MG PO TABS: 20.0000 mg | ORAL_TABLET | Freq: Every day | ORAL | Status: DC

## 2014-07-26 LAB — HM DIABETES EYE EXAM

## 2014-08-03 ENCOUNTER — Other Ambulatory Visit: Payer: Self-pay | Admitting: Family Medicine

## 2014-08-24 LAB — HEMOGLOBIN A1C
A1c: 9.8
LDL Cholesterol: 46 mg/dL

## 2014-10-01 ENCOUNTER — Encounter: Payer: Self-pay | Admitting: Family Medicine

## 2014-10-01 ENCOUNTER — Ambulatory Visit (INDEPENDENT_AMBULATORY_CARE_PROVIDER_SITE_OTHER): Payer: 59 | Admitting: Family Medicine

## 2014-10-01 ENCOUNTER — Encounter (INDEPENDENT_AMBULATORY_CARE_PROVIDER_SITE_OTHER): Payer: Self-pay

## 2014-10-01 VITALS — BP 120/74 | HR 74 | Resp 16 | Ht 70.0 in | Wt 211.0 lb

## 2014-10-01 DIAGNOSIS — N529 Male erectile dysfunction, unspecified: Secondary | ICD-10-CM | POA: Insufficient documentation

## 2014-10-01 DIAGNOSIS — IMO0001 Reserved for inherently not codable concepts without codable children: Secondary | ICD-10-CM

## 2014-10-01 DIAGNOSIS — N521 Erectile dysfunction due to diseases classified elsewhere: Secondary | ICD-10-CM

## 2014-10-01 DIAGNOSIS — E669 Obesity, unspecified: Secondary | ICD-10-CM

## 2014-10-01 DIAGNOSIS — Z794 Long term (current) use of insulin: Secondary | ICD-10-CM

## 2014-10-01 DIAGNOSIS — I10 Essential (primary) hypertension: Secondary | ICD-10-CM

## 2014-10-01 DIAGNOSIS — E119 Type 2 diabetes mellitus without complications: Secondary | ICD-10-CM

## 2014-10-01 DIAGNOSIS — E785 Hyperlipidemia, unspecified: Secondary | ICD-10-CM

## 2014-10-01 NOTE — Patient Instructions (Addendum)
F/u in 4.5 month, call if you need me before   New for ED is viagra , we will call about this tomorrow on your cell  Keep up with blood suagr and see Dr Dorris Fetch as you should, also see dietian  Pls keep eye APPT  NEXT VISIT CONSIDER PREVNAR , COME WITH SOMEONE

## 2014-10-04 NOTE — Assessment & Plan Note (Signed)
Difficulty in attaining and maintaining erection despite the desire, a complication of his chronic medical conditions . Has responded well to low dose viagra, higher dose causes headache  Will check cost through [facility] health and quantity limit per 3 month and let pt know before sending in, he has obtained from local pharmacy in the past

## 2014-10-04 NOTE — Progress Notes (Signed)
Subjective:    Patient ID: Travis Herrera, male    DOB: September 03, 1956, 58 y.o.   MRN: [id number]  HPI The PT is here for follow up and re-evaluation of chronic medical conditions, medication management and review of any available recent lab and radiology data.  Preventive health is updated, specifically  Cancer screening and Immunization.   Questions or concerns regarding consultations or procedures which the PT has had in the interim are  Addressed.Has seen endo, unfortunately blood sugar has increased significantly and he was symptomatic and still did not connect the 2, doing much better now The PT denies any adverse reactions to current medications since the last visit.  Requests medication for Ed, has had success with low dose viagra Has had  polyuria, polydipsia, blurred vision , and fatigue since last here, but improved now with better blood suagr control    Review of Systems See HPI Denies recent fever or chills. Denies sinus pressure, nasal congestion, ear pain or sore throat. Denies chest congestion, productive cough or wheezing. Denies chest pains, palpitations and leg swelling Denies abdominal pain, nausea, vomiting,diarrhea or constipation.   Denies dysuria, frequency, hesitancy or incontinence. Denies joint pain, swelling and limitation in mobility. Denies headaches, seizures, numbness, or tingling. Denies depression, anxiety or insomnia. Denies skin break down or rash.        Objective:   Physical Exam BP 120/74 mmHg  Pulse 74  Resp 16  Ht 5\' 10"  (1.778 m)  Wt 211 lb (95.709 kg)  BMI 30.28 kg/m2  SpO2 96% Patient alert and oriented and in no cardiopulmonary distress.  HEENT: No facial asymmetry, EOMI,   oropharynx pink and moist.  Neck supple no JVD, no mass.  Chest: Clear to auscultation bilaterally.  CVS: S1, S2 no murmurs, no S3.Regular rate.  ABD: Soft non tender.   Ext: No edema  MS: Adequate ROM spine, shoulders, hips and knees.  Skin:  Intact, no ulcerations or rash noted.  Psych: Good eye contact, normal affect. Memory intact not anxious or depressed appearing.  CNS: CN 2-12 intact, power,  normal throughout.no focal deficits noted.        Assessment & Plan:  IDDM (insulin dependent diabetes mellitus) Patient advised to reduce carb and sweets, commit to regular physical activity, take meds as prescribed, test blood as directed, and attempt to lose weight, to improve blood sugar control. Managed by endo and reports improvement in symptoms and blood glucose values, also currently seeing a nutritionist  HTN, goal below 130/80 Controlled, no change in medication DASH diet and commitment to daily physical activity for a minimum of 30 minutes discussed and encouraged, as a part of hypertension management. The importance of attaining a healthy weight is also discussed.   Erectile dysfunction Difficulty in attaining and maintaining erection despite the desire, a complication of his chronic medical conditions . Has responded well to low dose viagra, higher dose causes headache  Will check cost through [facility] health and quantity limit per 3 month and let pt know before sending in, he has obtained from local pharmacy in the past  Hyperlipidemia LDL goal <100 Hyperlipidemia:Low fat diet discussed and encouraged.  Updated lab needed at/ before next visit. Controlled when last checked  Obesity (BMI 30.0-34.9) Unchanged Patient re-educated about  the importance of commitment to a  minimum of 150 minutes of exercise per week. The importance of healthy food choices with portion control discussed. Encouraged to start a food diary, count calories and to consider  joining  a support group. Sample diet sheets offered. Goals set by the patient for the next several months.

## 2014-10-04 NOTE — Assessment & Plan Note (Signed)
Patient advised to reduce carb and sweets, commit to regular physical activity, take meds as prescribed, test blood as directed, and attempt to lose weight, to improve blood sugar control. Managed by endo and reports improvement in symptoms and blood glucose values, also currently seeing a nutritionist

## 2014-10-04 NOTE — Assessment & Plan Note (Signed)
Unchanged. Patient re-educated about  the importance of commitment to a  minimum of 150 minutes of exercise per week. The importance of healthy food choices with portion control discussed. Encouraged to start a food diary, count calories and to consider  joining a support group. Sample diet sheets offered. Goals set by the patient for the next several months.    

## 2014-10-04 NOTE — Assessment & Plan Note (Signed)
Controlled, no change in medication DASH diet and commitment to daily physical activity for a minimum of 30 minutes discussed and encouraged, as a part of hypertension management. The importance of attaining a healthy weight is also discussed.  

## 2014-10-04 NOTE — Assessment & Plan Note (Signed)
Hyperlipidemia:Low fat diet discussed and encouraged.  Updated lab needed at/ before next visit. Controlled when last checked

## 2014-10-05 ENCOUNTER — Telehealth: Payer: Self-pay | Admitting: Family Medicine

## 2014-10-05 ENCOUNTER — Other Ambulatory Visit: Payer: Self-pay | Admitting: Family Medicine

## 2014-10-05 MED ORDER — SILDENAFIL CITRATE 100 MG PO TABS: 100.0000 mg | ORAL_TABLET | Freq: Every day | ORAL | Status: DC | PRN

## 2014-10-05 NOTE — Telephone Encounter (Signed)
Pls try and call pt on his cell, I did, no answer , but left him a msg to call you.I had told him we would get back to him since last week> I checked into [facility], it will cost $25  For 6 tabs (per month) I prescribed the 100 mg dose, he needs to BREAK in half and therefore  use the 50 mg dose he tolerates and doers well with, therefore for $25 he gets 12 uses, this is the best I could do , and better than previous supplier  ??pls ask

## 2014-10-05 NOTE — Telephone Encounter (Signed)
Noted.  Will call patient back before for the end of the day

## 2014-10-05 NOTE — Telephone Encounter (Signed)
Patient aware.

## 2014-11-29 LAB — HEMOGLOBIN A1C: A1C: 9

## 2015-01-06 ENCOUNTER — Other Ambulatory Visit: Payer: Self-pay | Admitting: Family Medicine

## 2015-01-27 ENCOUNTER — Other Ambulatory Visit: Payer: Self-pay | Admitting: Family Medicine

## 2015-01-28 ENCOUNTER — Encounter: Payer: Self-pay | Admitting: Orthopedic Surgery

## 2015-01-28 ENCOUNTER — Ambulatory Visit (INDEPENDENT_AMBULATORY_CARE_PROVIDER_SITE_OTHER): Payer: 59

## 2015-01-28 ENCOUNTER — Ambulatory Visit (INDEPENDENT_AMBULATORY_CARE_PROVIDER_SITE_OTHER): Payer: 59 | Admitting: Orthopedic Surgery

## 2015-01-28 VITALS — BP 118/69 | Ht 70.0 in | Wt 209.0 lb

## 2015-01-28 DIAGNOSIS — M25562 Pain in left knee: Secondary | ICD-10-CM

## 2015-01-28 DIAGNOSIS — M1712 Unilateral primary osteoarthritis, left knee: Secondary | ICD-10-CM

## 2015-01-28 DIAGNOSIS — M129 Arthropathy, unspecified: Secondary | ICD-10-CM | POA: Diagnosis not present

## 2015-01-28 NOTE — Patient Instructions (Signed)
Joint Injection  Care After  Refer to this sheet in the next few days. These instructions provide you with information on caring for yourself after you have had a joint injection. Your caregiver also may give you more specific instructions. Your treatment has been planned according to current medical practices, but problems sometimes occur. Call your caregiver if you have any problems or questions after your procedure.  After any type of joint injection, it is not uncommon to experience:  · Soreness, swelling, or bruising around the injection site.  · Mild numbness, tingling, or weakness around the injection site caused by the numbing medicine used before or with the injection.  It also is possible to experience the following effects associated with the specific agent after injection:  · Iodine-based contrast agents:  ¨ Allergic reaction (itching, hives, widespread redness, and swelling beyond the injection site).  · Corticosteroids (These effects are rare.):  ¨ Allergic reaction.  ¨ Increased blood sugar levels (If you have diabetes and you notice that your blood sugar levels have increased, notify your caregiver).  ¨ Increased blood pressure levels.  ¨ Mood swings.  · Hyaluronic acid in the use of viscosupplementation.  ¨ Temporary heat or redness.  ¨ Temporary rash and itching.  ¨ Increased fluid accumulation in the injected joint.  These effects all should resolve within a day after your procedure.   HOME CARE INSTRUCTIONS  · Limit yourself to light activity the day of your procedure. Avoid lifting heavy objects, bending, stooping, or twisting.  · Take prescription or over-the-counter pain medication as directed by your caregiver.  · You may apply ice to your injection site to reduce pain and swelling the day of your procedure. Ice may be applied 03-04 times:  ¨ Put ice in a plastic bag.  ¨ Place a towel between your skin and the bag.  ¨ Leave the ice on for no longer than 15-20 minutes each time.  SEEK  IMMEDIATE MEDICAL CARE IF:   · Pain and swelling get worse rather than better or extend beyond the injection site.  · Numbness does not go away.  · Blood or fluid continues to leak from the injection site.  · You have chest pain.  · You have swelling of your face or tongue.  · You have trouble breathing or you become dizzy.  · You develop a fever, chills, or severe tenderness at the injection site that last longer than 1 day.  MAKE SURE YOU:  · Understand these instructions.  · Watch your condition.  · Get help right away if you are not doing well or if you get worse.  Document Released: 06/15/2011 Document Revised: 12/25/2011 Document Reviewed: 06/15/2011  ExitCare® Patient Information ©2015 [employer]. This information is not intended to replace advice given to you by your health care provider. Make sure you discuss any questions you have with your health care provider.

## 2015-01-28 NOTE — Progress Notes (Signed)
Patient ID: SHAUNE WESTFALL, male   DOB: [birth year], 59 y.o.   MRN: [id number] Subjective:    AHMAD VANWEY is a 59 y.o. male who presents with knee pain involving the left knee. Onset was gradual, starting about several yrs  ago. Inciting event: none known. Current symptoms include: pain located lateral joint , stiffness and swelling. Pain is aggravated by any weight bearing, standing and walking. Patient has had prior knee problems. Evaluation to date: none. Treatment to date: ice, rest and ibuprofen.  Past Medical History  Diagnosis Date  . Diabetes mellitus without complication 0737    insulin started in 2012  . Hypertension 2010  . Hyperlipemia 2013    Past Surgical History  Procedure Laterality Date  . Cataract extraction w/phaco  07/29/2012    Procedure: CATARACT EXTRACTION PHACO AND INTRAOCULAR LENS PLACEMENT (IOC);  Surgeon: Tonny Branch, MD;  Location: [facility];  Service: Ophthalmology;  Laterality: Left;  CDE=1.66  . Cholecystectomy  2007    [facility]  . Eye surgery Left 2013    cataract    Family History  Problem Relation Age of Onset  . Cancer Sister   . Early death Father     car accident   . Cancer Brother   . Hypertension Brother   . Diabetes Brother   . Hypertension Brother     Social History History  Substance Use Topics  . Smoking status: Never Smoker   . Smokeless tobacco: Not on file  . Alcohol Use: No    No Known Allergies  Current Outpatient Prescriptions  Medication Sig Dispense Refill  . amLODipine (NORVASC) 10 MG tablet TAKE 1 TABLET BY MOUTH DAILY. 90 tablet 0  . aspirin EC 81 MG tablet Take 81 mg by mouth daily.    . benazepril (LOTENSIN) 20 MG tablet TAKE 1 TABLET (20 MG TOTAL) BY MOUTH DAILY. 90 tablet 1  . CRESTOR 10 MG tablet TAKE 1 TABLET (10 MG TOTAL) BY MOUTH DAILY. 90 tablet 1  . hydrochlorothiazide (HYDRODIURIL) 25 MG tablet TAKE 1 TABLET BY MOUTH DAILY. 90 tablet 1  . insulin glargine (LANTUS) 100 UNIT/ML injection Inject 20  Units into the skin daily.    . Liraglutide (VICTOZA) 18 MG/3ML SOLN Inject 1.8 mg into the skin.    . metFORMIN (GLUCOPHAGE) 1000 MG tablet Take 1,000 mg by mouth 2 (two) times daily with a meal.    . Omega-3 Fatty Acids (FISH OIL) 1000 MG CAPS Take 1,000 mg by mouth daily.     . sildenafil (VIAGRA) 100 MG tablet Take 1 tablet (100 mg total) by mouth daily as needed for erectile dysfunction. 6 tablet 3   No current facility-administered medications for this visit.      Review of Systems A comprehensive review of systems was negative.   Objective:    BP 118/69 mmHg  Ht 5\' 10"  (1.778 m)  Wt 209 lb (94.802 kg)  BMI 29.99 kg/m2 GENERAL normal mesomorphic ORIENTATION x 3 normal  MOOD normal   UPPEREXTREMITIES: normal   Right knee: normal, no effusion, full active range of motion, no joint line tenderness, ligamentous structures intact. and motor ext 5/5  Left knee:  positive exam findings: effusion, lateral joint line tenderness, warmth and ROM limited to approximately 90 degrees, negative exam findings: ACL stable, PCL stable, MCL stable, LCL stable and McMurray's negative and motor 5/5   LEFT KNEE IS IN VALGUS  SKIN normal   CV normal PULSES BOTH FEET   LYMPH negative  left groin   SENSATION normal both LE'S    COORDINATION BALANCE NORMAL    XRAYS I INTERPRET AS SEVERE VALGUS OA  Assessment:    Encounter Diagnoses  Name Primary?  . Left knee pain Yes  . Arthritis of knee, left     Plan:   LEFT KNEE ASP INJ  Procedure note injection and aspiration left knee joint  Verbal consent was obtained to aspirate and inject the left knee joint   Timeout was completed to confirm the site of aspiration and injection  An 18-gauge needle was used to aspirate the left knee joint from a suprapatellar lateral approach.  The medications used were 40 mg of Depo-Medrol and 1% lidocaine 3 cc  Anesthesia was provided by ethyl chloride and the skin was prepped with  alcohol.  After cleaning the skin with alcohol an 18-gauge needle was used to aspirate the right knee joint.  We obtained 30 cc of fluid  We follow this by injection of 40 mg of Depo-Medrol and 3 cc 1% lidocaine.  There were no complications. A sterile bandage was applied.   RETURN PRN  DISCUSSED KNEE REPLACEMENT

## 2015-02-08 ENCOUNTER — Other Ambulatory Visit: Payer: Self-pay | Admitting: *Deleted

## 2015-02-08 NOTE — Patient Outreach (Signed)
[facility]) Care Management   02/08/2015  ZOHAR MARONEY 04-06-1956 [id number]  Travis MCCLENATHAN is an 59 y.o. male presents for routine Link To Wellness follow up for self management assistance with Type II DM.  Subjective:  He has no complaints. States all his blood sugar numbers are falling within target both fasting/premeal and postmeal. Says he is consistently following a CHO controlled meal plan adn has significantly reduced his consumption of white bread, rice, pasta and concentrated sweets.  Objective:   ROS  Physical Exam  Constitutional: He is oriented to person, place, and time. He appears well-developed and well-nourished.  Neurological: He is alert and oriented to person, place, and time.  Psychiatric: He has a normal mood and affect. His behavior is normal. Judgment and thought content normal.   Filed Weights   02/08/15 1430  Weight: 208 lb (94.348 kg)   Filed Vitals:   02/08/15 1430  BP: 125/70  Post Prandial POC CBG= 100  Current Medications:   Current Outpatient Prescriptions  Medication Sig Dispense Refill  . amLODipine (NORVASC) 10 MG tablet TAKE 1 TABLET BY MOUTH DAILY. 90 tablet 0  . aspirin EC 81 MG tablet Take 81 mg by mouth daily.    . benazepril (LOTENSIN) 20 MG tablet TAKE 1 TABLET (20 MG TOTAL) BY MOUTH DAILY. 90 tablet 1  . CRESTOR 10 MG tablet TAKE 1 TABLET (10 MG TOTAL) BY MOUTH DAILY. 90 tablet 1  . hydrochlorothiazide (HYDRODIURIL) 25 MG tablet TAKE 1 TABLET BY MOUTH DAILY. 90 tablet 1  . Insulin Glargine 300 UNIT/ML SOPN Inject 30 Units into the skin once. Takes at night    . Liraglutide (VICTOZA) 18 MG/3ML SOLN Inject 1.8 mg into the skin.    . metFORMIN (GLUCOPHAGE) 1000 MG tablet Take 1,000 mg by mouth 2 (two) times daily with a meal.    . Omega-3 Fatty Acids (FISH OIL) 1000 MG CAPS Take 1,000 mg by mouth daily.     . sildenafil (VIAGRA) 100 MG tablet Take 1 tablet (100 mg total) by mouth daily as needed for erectile  dysfunction. 6 tablet 3  . insulin glargine (LANTUS) 100 UNIT/ML injection Inject 20 Units into the skin daily.     No current facility-administered medications for this visit.    Functional Status:   In your present state of health, do you have any difficulty performing the following activities: 02/08/2015 04/21/2014  Hearing? N N  Vision? N N  Difficulty concentrating or making decisions? N N  Walking or climbing stairs? N N  Dressing or bathing? N N  Doing errands, shopping? N N    Fall/Depression Screening:    PHQ 2/9 Scores 02/08/2015  PHQ - 2 Score 0   THN CM Care Plan        Patient Outreach from 02/08/2015 in [place] Problem One  Type II DM not meeting A1C target as evidenced by POC A1C=8.7% on 12/28/14   Care Plan for Problem One  Active   Interventions for Problem One Long Term Goal  Using a picture representation, reviewed the 8 core pathophysiologic deficits in Type II diabetes, discussed physiology of diabetes as a chronic progressive disease with the initial problem of insulin resistance in the muscle, liver and fat cells and then increased loss of beta cell function over time resulting in decreased insulin production,  discussed role of obesity, especially central obesity, on insulin resistance, reviewed patient medications, discussed DM medications of Toujeo,  Metformin and Victoza including the mechanism of action, common side effects, dosages and dosing schedule, reinforced the importance of taking all medications as prescribed, reviewed patient's meter history,  discussed blood glucose monitoring and interpretation, discussed recommended target ranges for pre-meal and post-meal, reviewed upcoming appointments with patient's primary care MD and endocrinologist, reinforced the importance of keeping the appointment, encouraged patient to write questions/concerns in advance to discuss with health team member,  scheduled Link To Wellness follow up.   THN  Long Term Goal (31-90 days)  Improved glycemic control as evidenced by A1C <8.0% at next check   [facility] Long Term Goal Start Date  02/08/15     Assessment:   Link To Wellness member with Type II DM reporting all blood sugars meeting target,  attributed to consistent adherence to CHO controlled meal plan and ongoing medication adherence.  Plan:  RNCM to fax today's office visit note to Dr. Moshe Cipro and Dr. Dorris Fetch. RNCM will meet monthly and as needed with patient per Link To Wellness program guidelines to assist with Type II DM self-management and assess patient's progress toward mutually set goals.   [name] RN,CCM,CDE [place] Management Coordinator Office Phone [phone] Office Fax [phone]

## 2015-02-22 ENCOUNTER — Other Ambulatory Visit: Payer: Self-pay | Admitting: *Deleted

## 2015-02-22 NOTE — Patient Outreach (Signed)
Travis Herrera is here for POC A1C check. POC A1C= 7.8%.  [name] RN,CCM,CDE [place] Management Coordinator Office Phone [phone] Office Fax [phone]

## 2015-03-01 LAB — HEMOGLOBIN A1C: HEMOGLOBIN A1C: 7.7 % — AB (ref 4.0–6.0)

## 2015-04-12 ENCOUNTER — Other Ambulatory Visit: Payer: Self-pay | Admitting: *Deleted

## 2015-04-12 NOTE — Patient Outreach (Signed)
Saw Travis Herrera in the hall at [facility] and he told me he is retiring as of July 15 th and will no longer carry [employer] and he is aware that he will no longer be eligible for the Link To Wellness program. Will notify [name], care management assistant, to close him to the [employer] To Wellness program on July 15 th. [name] RN,CCM,CDE [place] Management Coordinator Link To Wellness Office Phone [phone] Office Fax [phone]

## 2015-04-14 ENCOUNTER — Other Ambulatory Visit: Payer: Self-pay

## 2015-04-14 MED ORDER — AMLODIPINE BESYLATE 10 MG PO TABS: 10.0000 mg | ORAL_TABLET | Freq: Every day | ORAL | Status: DC

## 2015-05-05 ENCOUNTER — Encounter: Payer: Self-pay | Admitting: Family Medicine

## 2015-05-05 ENCOUNTER — Ambulatory Visit (INDEPENDENT_AMBULATORY_CARE_PROVIDER_SITE_OTHER): Payer: 59 | Admitting: Family Medicine

## 2015-05-05 ENCOUNTER — Other Ambulatory Visit: Payer: Self-pay

## 2015-05-05 VITALS — BP 118/74 | HR 71 | Resp 16 | Ht 70.0 in | Wt 197.0 lb

## 2015-05-05 DIAGNOSIS — E785 Hyperlipidemia, unspecified: Secondary | ICD-10-CM | POA: Diagnosis not present

## 2015-05-05 DIAGNOSIS — Z125 Encounter for screening for malignant neoplasm of prostate: Secondary | ICD-10-CM

## 2015-05-05 DIAGNOSIS — E119 Type 2 diabetes mellitus without complications: Secondary | ICD-10-CM

## 2015-05-05 DIAGNOSIS — B353 Tinea pedis: Secondary | ICD-10-CM

## 2015-05-05 DIAGNOSIS — Z794 Long term (current) use of insulin: Secondary | ICD-10-CM

## 2015-05-05 DIAGNOSIS — Z1211 Encounter for screening for malignant neoplasm of colon: Secondary | ICD-10-CM

## 2015-05-05 DIAGNOSIS — M1732 Unilateral post-traumatic osteoarthritis, left knee: Secondary | ICD-10-CM

## 2015-05-05 DIAGNOSIS — N529 Male erectile dysfunction, unspecified: Secondary | ICD-10-CM

## 2015-05-05 DIAGNOSIS — I10 Essential (primary) hypertension: Secondary | ICD-10-CM

## 2015-05-05 DIAGNOSIS — M1712 Unilateral primary osteoarthritis, left knee: Secondary | ICD-10-CM | POA: Insufficient documentation

## 2015-05-05 DIAGNOSIS — IMO0001 Reserved for inherently not codable concepts without codable children: Secondary | ICD-10-CM

## 2015-05-05 DIAGNOSIS — E669 Obesity, unspecified: Secondary | ICD-10-CM

## 2015-05-05 MED ORDER — ROSUVASTATIN CALCIUM 10 MG PO TABS: ORAL_TABLET | ORAL | Status: DC

## 2015-05-05 MED ORDER — AMLODIPINE BESYLATE 10 MG PO TABS: 10.0000 mg | ORAL_TABLET | Freq: Every day | ORAL | Status: DC

## 2015-05-05 MED ORDER — HYDROCHLOROTHIAZIDE 25 MG PO TABS: 25.0000 mg | ORAL_TABLET | Freq: Every day | ORAL | Status: DC

## 2015-05-05 MED ORDER — TERBINAFINE HCL 250 MG PO TABS: 250.0000 mg | ORAL_TABLET | Freq: Every day | ORAL | Status: DC

## 2015-05-05 MED ORDER — BENAZEPRIL HCL 20 MG PO TABS: ORAL_TABLET | ORAL | Status: DC

## 2015-05-05 NOTE — Assessment & Plan Note (Signed)
Bilateral moderately severe tinea pedis, 1 month of oral terbinafine prescribed

## 2015-05-05 NOTE — Progress Notes (Signed)
ANTHONEY SHEPPARD     MRN: [id number]      DOB: 1955-10-30   HPI Mr. Partain is here for follow up and re-evaluation of chronic medical conditions, medication management and review of any available recent lab and radiology data.  Preventive health is updated, specifically  Cancer screening and Immunization.   Questions or concerns regarding consultations or procedures which the PT has had in the interim are  Addressed. Recent eval by ortho recommends left knee replacement , pt holding out on this currently Extremely proud of markedly improved blood sugar The PT denies any adverse reactions to current medications since the last visit.  Retired 1 week ago an very happy about this. Recently applied for disability due to left knee also   ROS Denies recent fever or chills. Denies sinus pressure, nasal congestion, ear pain or sore throat. Denies chest congestion, productive cough or wheezing. Denies chest pains, palpitations and leg swelling Denies abdominal pain, nausea, vomiting,diarrhea or constipation.   Denies dysuria, frequency, hesitancy or incontinence.  Denies headaches, seizures, numbness, or tingling. Denies depression, anxiety or insomnia. Denies skin break down or rash.   PE  BP 118/74 mmHg  Pulse 71  Resp 16  Ht 5\' 10"  (1.778 m)  Wt 197 lb (89.359 kg)  BMI 28.27 kg/m2  SpO2 100%  Patient alert and oriented and in no cardiopulmonary distress.  HEENT: No facial asymmetry, EOMI,   oropharynx pink and moist.  Neck supple no JVD, no mass.  Chest: Clear to auscultation bilaterally.  CVS: S1, S2 no murmurs,.   no S3.Regular rate.  ABD: Soft non tender. No organomegaly or mass, normal BS Rectal: normal sphincter, no external lesions visible, no internal lesions  Heme negative stool, prostate smooth  Ext: No edema  MS: Adequate ROM spine, shoulders, hips and right  Knee, deformity and crepitus of left kneee  Skin: Intact, bilateral moderately severe tinea  pedis  Psych: Good eye contact, normal affect. Memory intact not anxious or depressed appearing.  CNS: CN 2-12 intact, power,  normal throughout.no focal deficits noted.   Assessment & Plan   HTN, goal below 130/80 Controlled, no change in medication DASH diet and commitment to daily physical activity for a minimum of 30 minutes discussed and encouraged, as a part of hypertension management. The importance of attaining a healthy weight is also discussed.  BP/Weight 05/05/2015 02/08/2015 01/28/2015 10/01/2014 04/21/2014 12/08/2013 9/76/7341  Systolic BP 937 902 409 735 329 - 924  Diastolic BP 74 70 69 74 82 - 80  Wt. (Lbs) 197 208 209 211 212 210 213  BMI 28.27 29.84 29.99 30.28 30.42 30.13 31.44        IDDM (insulin dependent diabetes mellitus) Controlled and marked improvement Managed by endo Mr. Wehmeyer is reminded of the importance of commitment to daily physical activity for 30 minutes or more, as able and the need to limit carbohydrate intake to 30 to 60 grams per meal to help with blood sugar control.   The need to take medication as prescribed, test blood sugar as directed, and to call between visits if there is a concern that blood sugar is uncontrolled is also discussed.   Mr. Butcher is reminded of the importance of daily foot exam, annual eye examination, and good blood sugar, blood pressure and cholesterol control.  Diabetic Labs Latest Ref Rng 03/01/2015 08/21/2014 07/25/2012  HbA1c 4.0 - 6.0 % 7.7(A) - -  Calc LDL - - 46 -  Creatinine 0.50 - 1.35 mg/dL - -  0.86   BP/Weight 05/05/2015 02/08/2015 01/28/2015 10/01/2014 04/21/2014 12/08/2013 6/96/2952  Systolic BP 841 324 401 027 253 - 664  Diastolic BP 74 70 69 74 82 - 80  Wt. (Lbs) 197 208 209 211 212 210 213  BMI 28.27 29.84 29.99 30.28 30.42 30.13 31.44   Foot/eye exam completion dates 04/21/2014  Foot Form Completion Done         Tinea pedis Bilateral moderately severe tinea pedis, 1 month of oral terbinafine  prescribed  Erectile dysfunction Good response to viagra, continue same  Hyperlipidemia LDL goal <100 Updated lab needed at/ before next visit. Hyperlipidemia:Low fat diet discussed and encouraged.   Lipid Panel  Lab Results  Component Value Date   LDLCALC 46 08/21/2014        Obesity (BMI 30.0-34.9) Improved. Patient re-educated about  the importance of commitment to a  minimum of 150 minutes of exercise per week.  The importance of healthy food choices with portion control discussed. Encouraged to start a food diary, count calories and to consider  joining a support group. Sample diet sheets offered. Goals set by the patient for the next several months.   Weight /BMI 05/05/2015 02/08/2015 01/28/2015  WEIGHT 197 lb 208 lb 209 lb  HEIGHT 5\' 10"  5\' 10"  5\' 10"   BMI 28.27 kg/m2 29.84 kg/m2 29.99 kg/m2    Current exercise per week 150 minutes.   Osteoarthritis of left knee Severe and disabling, unstable at times , but denies falls, reports uses a cane at times Ortho eval in past approx 4 months, replace,ment recommended, pt holding off on this currently, but does state that he recently applied for disability  Special screening for malignant neoplasms, colon Heme negative stool, no palpable mass on rectal exam

## 2015-05-05 NOTE — Assessment & Plan Note (Signed)
Controlled, no change in medication DASH diet and commitment to daily physical activity for a minimum of 30 minutes discussed and encouraged, as a part of hypertension management. The importance of attaining a healthy weight is also discussed.  BP/Weight 05/05/2015 02/08/2015 01/28/2015 10/01/2014 04/21/2014 12/08/2013 11/19/5595  Systolic BP 416 384 536 468 032 - 122  Diastolic BP 74 70 69 74 82 - 80  Wt. (Lbs) 197 208 209 211 212 210 213  BMI 28.27 29.84 29.99 30.28 30.42 30.13 31.44

## 2015-05-05 NOTE — Assessment & Plan Note (Signed)
Updated lab needed at/ before next visit. Hyperlipidemia:Low fat diet discussed and encouraged.   Lipid Panel  Lab Results  Component Value Date   LDLCALC 46 08/21/2014

## 2015-05-05 NOTE — Patient Instructions (Addendum)
F/u in 4 months, call if you need me before  CONGRATS and all the best with the next chapter of your life!!!  We miss you, but I KNOW that you will continue to make the world smile and be a better place!   You are referred for eye exam  Urine ofr annul test, foot exam and rectal exam done today  Medication for fungal foot infection sent 1 month of tablets once daily  90 day supply of meds we prescribed are sent in  [place] on improved health and improved blood sugar  Continue daily physical activity  Watch and be careful not to fall, because of your left knee  Fasting lipid, PSA, cbc, tsh,  cmp and EGFR asap

## 2015-05-05 NOTE — Assessment & Plan Note (Signed)
Good response to viagra, continue same

## 2015-05-05 NOTE — Assessment & Plan Note (Signed)
Heme negative stool, no palpable mass on rectal exam

## 2015-05-05 NOTE — Assessment & Plan Note (Signed)
Improved. Patient re-educated about  the importance of commitment to a  minimum of 150 minutes of exercise per week.  The importance of healthy food choices with portion control discussed. Encouraged to start a food diary, count calories and to consider  joining a support group. Sample diet sheets offered. Goals set by the patient for the next several months.   Weight /BMI 05/05/2015 02/08/2015 01/28/2015  WEIGHT 197 lb 208 lb 209 lb  HEIGHT 5\' 10"  5\' 10"  5\' 10"   BMI 28.27 kg/m2 29.84 kg/m2 29.99 kg/m2    Current exercise per week 150 minutes.

## 2015-05-05 NOTE — Assessment & Plan Note (Signed)
Severe and disabling, unstable at times , but denies falls, reports uses a cane at times Ortho eval in past approx 4 months, replace,ment recommended, pt holding off on this currently, but does state that he recently applied for disability

## 2015-05-05 NOTE — Assessment & Plan Note (Signed)
Controlled and marked improvement Managed by [name] is reminded of the importance of commitment to daily physical activity for 30 minutes or more, as able and the need to limit carbohydrate intake to 30 to 60 grams per meal to help with blood sugar control.   The need to take medication as prescribed, test blood sugar as directed, and to call between visits if there is a concern that blood sugar is uncontrolled is also discussed.   Travis Herrera is reminded of the importance of daily foot exam, annual eye examination, and good blood sugar, blood pressure and cholesterol control.  Diabetic Labs Latest Ref Rng 03/01/2015 08/21/2014 07/25/2012  HbA1c 4.0 - 6.0 % 7.7(A) - -  Calc LDL - - 46 -  Creatinine 0.50 - 1.35 mg/dL - - 0.86   BP/Weight 05/05/2015 02/08/2015 01/28/2015 10/01/2014 04/21/2014 12/08/2013 0/63/0160  Systolic BP 109 323 557 322 025 - 427  Diastolic BP 74 70 69 74 82 - 80  Wt. (Lbs) 197 208 209 211 212 210 213  BMI 28.27 29.84 29.99 30.28 30.42 30.13 31.44   Foot/eye exam completion dates 04/21/2014  Foot Form Completion Done

## 2015-05-06 LAB — MICROALBUMIN / CREATININE URINE RATIO
Creatinine, Urine: 165 mg/dL
MICROALB UR: 0.6 mg/dL (ref <2.0)
Microalb Creat Ratio: 3.6 mg/g (ref 0.0–30.0)

## 2015-05-10 ENCOUNTER — Other Ambulatory Visit: Payer: Self-pay | Admitting: *Deleted

## 2015-05-10 NOTE — Patient Outreach (Signed)
Attempted to reach [name] via cone e-mail to inquire whether he will keep his August [facility] To [facility] visit. Received a notification that the e-mail could not be delivered to [name] and he is no longer listed on the [name]. He told this RNCM last month that he was retiring mid July and will no longer carry cone [employer]. Will direct [name], [name] CM assistant to discharge him from the Link To Wellness DM program and notify [facility] that he will no longer be eligible for the program pharmacy benefit. [name] RN,CCM,CDE [place] Management Coordinator [facility] To [facility] Office Phone [phone] Office Fax [phone]

## 2015-05-24 ENCOUNTER — Other Ambulatory Visit: Payer: Self-pay | Admitting: *Deleted

## 2015-05-24 ENCOUNTER — Ambulatory Visit: Payer: 59 | Admitting: *Deleted

## 2015-05-24 NOTE — Patient Outreach (Signed)
[facility]) Care Management  05/24/2015  Travis Herrera [birth year] [id number]   Travis Herrera and his wife stopped by the [facility] CM office at [facility] to say hi. Travis Herrera did verify that he retired form [employer] on 7/25 and that he no longer has [employer]. Case was closed in  The previous note of 05/10/15. [name] RN,CCM,CDE [place] Management Coordinator Link To Wellness Office Phone [phone] Office Fax [phone]

## 2015-07-08 ENCOUNTER — Other Ambulatory Visit: Payer: Self-pay | Admitting: Family Medicine

## 2015-07-09 LAB — CBC WITH DIFFERENTIAL/PLATELET
Basophils Absolute: 0 10*3/uL (ref 0.0–0.1)
Basophils Relative: 0 % (ref 0–1)
EOS ABS: 0.2 10*3/uL (ref 0.0–0.7)
Eosinophils Relative: 2 % (ref 0–5)
HEMATOCRIT: 37.5 % — AB (ref 39.0–52.0)
Hemoglobin: 12.3 g/dL — ABNORMAL LOW (ref 13.0–17.0)
Lymphocytes Relative: 23 % (ref 12–46)
Lymphs Abs: 1.8 10*3/uL (ref 0.7–4.0)
MCH: 29.7 pg (ref 26.0–34.0)
MCHC: 32.8 g/dL (ref 30.0–36.0)
MCV: 90.6 fL (ref 78.0–100.0)
MONOS PCT: 7 % (ref 3–12)
MPV: 9.3 fL (ref 8.6–12.4)
Monocytes Absolute: 0.5 10*3/uL (ref 0.1–1.0)
NEUTROS PCT: 68 % (ref 43–77)
Neutro Abs: 5.3 10*3/uL (ref 1.7–7.7)
PLATELETS: 286 10*3/uL (ref 150–400)
RBC: 4.14 MIL/uL — ABNORMAL LOW (ref 4.22–5.81)
RDW: 13.9 % (ref 11.5–15.5)
WBC: 7.8 10*3/uL (ref 4.0–10.5)

## 2015-07-09 LAB — COMPLETE METABOLIC PANEL WITH GFR
ALBUMIN: 4.2 g/dL (ref 3.6–5.1)
ALK PHOS: 56 U/L (ref 40–115)
ALT: 10 U/L (ref 9–46)
AST: 17 U/L (ref 10–35)
BILIRUBIN TOTAL: 0.5 mg/dL (ref 0.2–1.2)
BUN: 19 mg/dL (ref 7–25)
CALCIUM: 9.7 mg/dL (ref 8.6–10.3)
CO2: 28 mmol/L (ref 20–31)
CREATININE: 1.06 mg/dL (ref 0.70–1.33)
Chloride: 101 mmol/L (ref 98–110)
GFR, Est African American: >89 mL/min (ref >=60)
GFR, Est Non African American: 78 mL/min (ref >=60)
Glucose, Bld: 124 mg/dL — ABNORMAL HIGH (ref 65–99)
POTASSIUM: 4.6 mmol/L (ref 3.5–5.3)
Sodium: 139 mmol/L (ref 135–146)
TOTAL PROTEIN: 6.8 g/dL (ref 6.1–8.1)

## 2015-07-09 LAB — LIPID PANEL
Cholesterol: 106 mg/dL — ABNORMAL LOW (ref 125–200)
HDL: 40 mg/dL (ref >=40)
LDL Cholesterol: 54 mg/dL (ref <130)
Total CHOL/HDL Ratio: 2.7 Ratio (ref <=5.0)
Triglycerides: 59 mg/dL (ref <150)
VLDL: 12 mg/dL (ref <30)

## 2015-07-09 LAB — TSH: TSH: 2.974 u[IU]/mL (ref 0.350–4.500)

## 2015-07-09 LAB — HEPATITIS C ANTIBODY: HCV Ab: NEGATIVE

## 2015-07-09 LAB — PSA: PSA: 1.1 ng/mL (ref <=4.00)

## 2015-07-09 LAB — FERRITIN: Ferritin: 80 ng/mL (ref 22–322)

## 2015-07-09 LAB — IRON: IRON: 54 ug/dL (ref 50–180)

## 2015-07-16 LAB — HEMOGLOBIN A1C: Hgb A1c MFr Bld: 7.9 % — AB (ref 4.0–6.0)

## 2015-07-27 ENCOUNTER — Ambulatory Visit (INDEPENDENT_AMBULATORY_CARE_PROVIDER_SITE_OTHER): Payer: BLUE CROSS/BLUE SHIELD | Admitting: "Endocrinology

## 2015-07-27 ENCOUNTER — Encounter: Payer: Self-pay | Admitting: "Endocrinology

## 2015-07-27 VITALS — BP 137/86 | HR 59 | Ht 70.0 in | Wt 194.0 lb

## 2015-07-27 DIAGNOSIS — E1165 Type 2 diabetes mellitus with hyperglycemia: Secondary | ICD-10-CM | POA: Diagnosis not present

## 2015-07-27 DIAGNOSIS — E785 Hyperlipidemia, unspecified: Secondary | ICD-10-CM

## 2015-07-27 DIAGNOSIS — I10 Essential (primary) hypertension: Secondary | ICD-10-CM | POA: Diagnosis not present

## 2015-07-27 DIAGNOSIS — IMO0001 Reserved for inherently not codable concepts without codable children: Secondary | ICD-10-CM

## 2015-07-27 MED ORDER — INSULIN GLARGINE 100 UNIT/ML SOLOSTAR PEN: 40.0000 [IU] | PEN_INJECTOR | Freq: Every day | SUBCUTANEOUS | Status: DC

## 2015-07-27 NOTE — Patient Instructions (Signed)

## 2015-07-27 NOTE — Progress Notes (Signed)
Subjective:    Patient ID: Travis Herrera, male    DOB: 13-Jun-1956,    Past Medical History  Diagnosis Date  . Diabetes mellitus without complication ([place]) 7322    insulin started in 2012  . Hypertension 2010  . Hyperlipemia 2013   Past Surgical History  Procedure Laterality Date  . Cataract extraction w/phaco  07/29/2012    Procedure: CATARACT EXTRACTION PHACO AND INTRAOCULAR LENS PLACEMENT (IOC);  Surgeon: Tonny Branch, MD;  Location: [facility];  Service: Ophthalmology;  Laterality: Left;  CDE=1.66  . Cholecystectomy  2007    [facility]  . Eye surgery Left 2013    cataract   Social History   Social History  . Marital Status: Married    Spouse Name: N/A  . Number of Children: N/A  . Years of Education: N/A   Social History Main Topics  . Smoking status: Never Smoker   . Smokeless tobacco: None  . Alcohol Use: No  . Drug Use: No  . Sexual Activity: Not Asked   Other Topics Concern  . None   Social History Narrative   Outpatient Encounter Prescriptions as of 07/27/2015  Medication Sig  . amLODipine (NORVASC) 10 MG tablet Take 1 tablet (10 mg total) by mouth daily.  Marland Kitchen aspirin EC 81 MG tablet Take 81 mg by mouth daily.  . benazepril (LOTENSIN) 20 MG tablet TAKE 1 TABLET (20 MG TOTAL) BY MOUTH DAILY.  . hydrochlorothiazide (HYDRODIURIL) 25 MG tablet Take 1 tablet (25 mg total) by mouth daily.  . Insulin Glargine (LANTUS SOLOSTAR) 100 UNIT/ML Solostar Pen Inject 40 Units into the skin daily at 10 pm.  . Insulin Glargine (TOUJEO SOLOSTAR) 300 UNIT/ML SOPN Inject 20 Units into the skin daily.  . Liraglutide (VICTOZA) 18 MG/3ML SOLN Inject 1.8 mg into the skin.  . metFORMIN (GLUCOPHAGE) 1000 MG tablet Take 1,000 mg by mouth 2 (two) times daily with a meal.  . Omega-3 Fatty Acids (FISH OIL) 1000 MG CAPS Take 1,000 mg by mouth daily.   . rosuvastatin (CRESTOR) 10 MG tablet TAKE 1 TABLET (10 MG TOTAL) BY MOUTH DAILY.  . sildenafil (VIAGRA) 100 MG tablet Take 1 tablet (100  mg total) by mouth daily as needed for erectile dysfunction.  . terbinafine (LAMISIL) 250 MG tablet Take 1 tablet (250 mg total) by mouth daily.   No facility-administered encounter medications on file as of 07/27/2015.   ALLERGIES: No Known Allergies VACCINATION STATUS: Immunization History  Administered Date(s) Administered  . Influenza Split 07/17/2014    Diabetes He presents for his follow-up diabetic visit. He has type 2 diabetes mellitus. Onset time: He was diagnosed at approximate age of 32 years. There are no hypoglycemic associated symptoms. Pertinent negatives for hypoglycemia include no confusion, headaches, pallor or seizures. There are no diabetic associated symptoms. Pertinent negatives for diabetes include no chest pain, no fatigue, no polydipsia, no polyphagia, no polyuria and no weakness. There are no hypoglycemic complications. Symptoms are stable. There are no diabetic complications. Risk factors for coronary artery disease include diabetes mellitus, dyslipidemia, hypertension, family history and male sex. Current diabetic treatment includes insulin injections and oral agent (dual therapy) (Due to nausea vomiting he stopped his VICTOZA a month ago here at). He is compliant with treatment most of the time. His weight is stable. He participates in exercise three times a week. There is no change in his home blood glucose trend. His overall blood glucose range is 140-180 mg/dl. An ACE inhibitor/angiotensin II receptor  blocker is being taken. Eye exam is current.  Hypertension This is a chronic problem. The current episode started more than 1 year ago. The problem has been gradually improving since onset. Pertinent negatives include no chest pain, headaches, neck pain, palpitations or shortness of breath. Past treatments include ACE inhibitors. There are no compliance problems.   Hyperlipidemia This is a chronic problem. The current episode started more than 1 year ago. Pertinent  negatives include no chest pain, myalgias or shortness of breath. Current antihyperlipidemic treatment includes statins.     Review of Systems  Constitutional: Negative for fatigue and unexpected weight change.  HENT: Negative for dental problem, mouth sores and trouble swallowing.   Eyes: Negative for visual disturbance.  Respiratory: Negative for cough, choking, chest tightness, shortness of breath and wheezing.   Cardiovascular: Negative for chest pain, palpitations and leg swelling.  Gastrointestinal: Negative for nausea, vomiting, abdominal pain, diarrhea, constipation and abdominal distention.  Endocrine: Negative for polydipsia, polyphagia and polyuria.  Genitourinary: Negative for dysuria, urgency, hematuria and flank pain.  Musculoskeletal: Negative for myalgias, back pain, gait problem and neck pain.  Skin: Negative for pallor, rash and wound.  Neurological: Negative for seizures, syncope, weakness, numbness and headaches.  Psychiatric/Behavioral: Negative.  Negative for confusion and dysphoric mood.    Objective:    BP 137/86 mmHg  Pulse 59  Ht 5\' 10"  (1.778 m)  Wt 194 lb (87.998 kg)  BMI 27.84 kg/m2  SpO2 100%  Wt Readings from Last 3 Encounters:  07/27/15 194 lb (87.998 kg)  05/05/15 197 lb (89.359 kg)  02/08/15 208 lb (94.348 kg)    Physical Exam  Constitutional: He is oriented to person, place, and time. He appears well-developed and well-nourished. He is cooperative. No distress.  HENT:  Head: Normocephalic and atraumatic.  Eyes: EOM are normal.  Neck: Normal range of motion. Neck supple. No tracheal deviation present. No thyromegaly present.  Cardiovascular: Normal rate, S1 normal, S2 normal and normal heart sounds.  Exam reveals no gallop.   No murmur heard. Pulses:      Dorsalis pedis pulses are 1+ on the right side, and 1+ on the left side.       Posterior tibial pulses are 1+ on the right side, and 1+ on the left side.  Pulmonary/Chest: Breath sounds  normal. No respiratory distress. He has no wheezes.  Abdominal: Soft. Bowel sounds are normal. He exhibits no distension. There is no tenderness. There is no guarding and no CVA tenderness.  Musculoskeletal: He exhibits no edema.       Right shoulder: He exhibits no swelling and no deformity.  Neurological: He is alert and oriented to person, place, and time. He has normal strength and normal reflexes. No cranial nerve deficit or sensory deficit. Gait normal.  Skin: Skin is warm and dry. No rash noted. No cyanosis. Nails show no clubbing.  Psychiatric: He has a normal mood and affect. His speech is normal and behavior is normal. Judgment and thought content normal. Cognition and memory are normal.    Results for orders placed or performed in visit on 07/27/15  Hemoglobin A1c  Result Value Ref Range   Hgb A1c MFr Bld 7.9 (A) 4.0 - 6.0 %  HM DIABETES EYE EXAM  Result Value Ref Range   HM Diabetic Eye Exam No Retinopathy No Retinopathy   Complete Blood Count (Most recent): Lab Results  Component Value Date   WBC 7.8 07/08/2015   HGB 12.3* 07/08/2015   HCT 37.5*  07/08/2015   MCV 90.6 07/08/2015   PLT 286 07/08/2015   Chemistry (most recent): Lab Results  Component Value Date   NA 139 07/08/2015   K 4.6 07/08/2015   CL 101 07/08/2015   CO2 28 07/08/2015   BUN 19 07/08/2015   CREATININE 1.06 07/08/2015   Diabetic Labs (most recent): Lab Results  Component Value Date   HGBA1C 7.9* 07/16/2015   HGBA1C 7.7* 03/01/2015   Lipid profile (most recent): Lab Results  Component Value Date   TRIG 59 07/08/2015   CHOL 106* 07/08/2015         Assessment & Plan:   1. Uncontrolled diabetes mellitus type 2 without complications, unspecified long term insulin use status ([place]) Patient came with stable glucose profile, and  recent A1c of 7.9 %.  Glucose logs and insulin administration records pertaining to this visit,  to be scanned into patient's records.  Recent labs reviewed. -  Patient remains at a high risk for more acute and chronic complications of diabetes which include CAD, CVA, CKD, retinopathy, and neuropathy. These are all discussed in detail with the patient.  - I have re-counseled the patient on diet management by adopting a carbohydrate restricted / protein rich  Diet. - Patient is advised to stick to a routine mealtimes to eat 3 meals  a day and avoid unnecessary snacks ( to snack only to correct hypoglycemia).  - Suggestion is made for patient to avoid simple carbohydrates   from their diet including Cakes , Desserts, Ice Cream,  Soda (  diet and regular) , Sweet Tea , Candies,  Chips, Cookies, Artificial Sweeteners,   and "Sugar-free" Products .  This will help patient to have stable blood glucose profile and potentially avoid unintended  Weight gain.  - The patient  has been  scheduled with Jearld Fenton, RDN, CDE for individualized DM education. - I have approached patient with the following individualized plan to manage diabetes and patient agrees.  - I will switch his basal insulin to Lantus 40 units QHS  ( his insurance would not pay for [place]). -He will not need prandial insulin for now . -He is advised to continue  monitoring of glucose  morning before breakfast.  - Patient is warned not to take insulin without proper monitoring per orders.  -Patient is encouraged to call clinic for blood glucose levels less than 70 or above 300 mg /dl. - I will resume  VICTOZA at lower dose of 0.6 mg subcutaneous daily to advance if tolerated ,therapeutically suitable for patient. -I will continue metformin 1000 mg by mouth twice a day.  - Patient specific target  for A1c; LDL, HDL, Triglycerides, and  Waist Circumference were discussed in detail.  2) BP/HTN: Controlled. Continue current medications including ACEI. 3) Lipids/HPL: Recent LDL at above target levels 56, continue statins. 4)  Weight/Diet: CDE consult in progress, exercise, and carbohydrates  information provided.  5) Chronic Care/Health Maintenance:  -Patient  on ACEI and Statin medications and encouraged to continue to follow up with Ophthalmology, Podiatrist at least yearly or according to recommendations, and advised to stay away from smoking. I have recommended yearly flu vaccine and pneumonia vaccination at least every 5 years; moderate intensity exercise for up to 150 minutes weekly; and  sleep for at least 7 hours a day.  I advised patient to maintain close follow up with their PCP for primary care needs.  Patient is asked to bring meter and  blood glucose logs during their next  visit.   Follow up plan: Return in about 3 months (around 10/27/2015) for diabetes, high blood pressure, high cholesterol, follow up with pre-visit labs, meter, and logs.  Travis Lloyd, MD Phone: [phone]  Fax: [phone]   07/27/2015, 9:03 AM

## 2015-08-16 ENCOUNTER — Other Ambulatory Visit: Payer: Self-pay | Admitting: "Endocrinology

## 2015-08-16 ENCOUNTER — Ambulatory Visit (INDEPENDENT_AMBULATORY_CARE_PROVIDER_SITE_OTHER): Payer: BLUE CROSS/BLUE SHIELD

## 2015-08-16 DIAGNOSIS — Z23 Encounter for immunization: Secondary | ICD-10-CM

## 2015-08-17 ENCOUNTER — Telehealth: Payer: Self-pay | Admitting: Family Medicine

## 2015-08-17 ENCOUNTER — Telehealth: Payer: Self-pay | Admitting: Orthopedic Surgery

## 2015-08-17 MED ORDER — DOXYCYCLINE HYCLATE 100 MG PO TABS: 100.0000 mg | ORAL_TABLET | Freq: Two times a day (BID) | ORAL | Status: DC

## 2015-08-17 NOTE — Telephone Encounter (Signed)
pls send doxycycline 100 mg twice daily x 1 week, advise I recommend ortho to evaluate hand as infections in hand can spread very qucikly up arm and in bloodstream, so need to be taken VERY seriously. Refer to ortho of his choice if he decides

## 2015-08-17 NOTE — Telephone Encounter (Signed)
Declined ortho referral. Wants to try the med first and will call back for referral if needed

## 2015-08-17 NOTE — Telephone Encounter (Signed)
Left thumb, hangnail and cuticle infected and draining green x 4 days. Wants antibiotic

## 2015-08-17 NOTE — Addendum Note (Signed)
Addended by: Eual Fines on: 08/17/2015 03:37 PM   Modules accepted: Orders

## 2015-08-17 NOTE — Telephone Encounter (Signed)
Opened in error

## 2015-08-17 NOTE — Telephone Encounter (Signed)
Travis Herrera has pulled back the cutical on his thumb and now it is warm to the touch and very sore and red. Patient is asking for an antibiotic please advise?

## 2015-09-16 ENCOUNTER — Ambulatory Visit: Payer: 59 | Admitting: Family Medicine

## 2015-10-20 ENCOUNTER — Other Ambulatory Visit: Payer: Self-pay | Admitting: "Endocrinology

## 2015-10-20 MED FILL — LANTUS SOLOSTAR 100 UNITS/M: 100 | 37 days supply | Qty: 15 | Fill #0

## 2015-10-27 ENCOUNTER — Other Ambulatory Visit: Payer: Self-pay | Admitting: "Endocrinology

## 2015-10-27 LAB — BASIC METABOLIC PANEL
BUN: 22 mg/dL (ref 7–25)
CO2: 32 mmol/L — ABNORMAL HIGH (ref 20–31)
Calcium: 9.5 mg/dL (ref 8.6–10.3)
Chloride: 100 mmol/L (ref 98–110)
Creat: 0.99 mg/dL (ref 0.70–1.33)
GLUCOSE: 115 mg/dL — AB (ref 65–99)
POTASSIUM: 4.2 mmol/L (ref 3.5–5.3)
SODIUM: 138 mmol/L (ref 135–146)

## 2015-10-27 LAB — HEMOGLOBIN A1C
HEMOGLOBIN A1C: 7.7 % — AB (ref <5.7)
MEAN PLASMA GLUCOSE: 174 mg/dL — AB (ref <117)

## 2015-10-29 ENCOUNTER — Ambulatory Visit: Payer: BLUE CROSS/BLUE SHIELD | Admitting: "Endocrinology

## 2015-11-04 ENCOUNTER — Other Ambulatory Visit: Payer: Self-pay | Admitting: "Endocrinology

## 2015-11-04 MED FILL — metFORMIN HCL 1000 MG TABS: 1000 | 30 days supply | Qty: 60 | Fill #0

## 2015-11-04 MED FILL — AMLODIPINE BESYLATE 10 MG T: 10 | 90 days supply | Qty: 90 | Fill #1

## 2015-11-08 ENCOUNTER — Ambulatory Visit (INDEPENDENT_AMBULATORY_CARE_PROVIDER_SITE_OTHER): Payer: BLUE CROSS/BLUE SHIELD | Admitting: "Endocrinology

## 2015-11-08 ENCOUNTER — Encounter: Payer: Self-pay | Admitting: "Endocrinology

## 2015-11-08 VITALS — BP 137/73 | HR 76 | Ht 70.0 in | Wt 201.0 lb

## 2015-11-08 DIAGNOSIS — I10 Essential (primary) hypertension: Secondary | ICD-10-CM | POA: Diagnosis not present

## 2015-11-08 DIAGNOSIS — E1165 Type 2 diabetes mellitus with hyperglycemia: Secondary | ICD-10-CM | POA: Diagnosis not present

## 2015-11-08 DIAGNOSIS — E785 Hyperlipidemia, unspecified: Secondary | ICD-10-CM

## 2015-11-08 DIAGNOSIS — IMO0001 Reserved for inherently not codable concepts without codable children: Secondary | ICD-10-CM

## 2015-11-08 MED ORDER — LIRAGLUTIDE 18 MG/3ML ~~LOC~~ SOPN
1.8000 mg | PEN_INJECTOR | Freq: Once | SUBCUTANEOUS | Status: DC
Start: 2015-11-08 — End: 2016-07-26

## 2015-11-08 MED FILL — VICTOZA 18 MG/3 ML INJECT P: 18 | 30 days supply | Qty: 9 | Fill #0

## 2015-11-08 NOTE — Progress Notes (Signed)
Subjective:    Patient ID: Travis Herrera, male    DOB: [birth year],    Past Medical History  Diagnosis Date  . Diabetes mellitus without complication ([place]) [id number]    insulin started in 2012  . Hypertension 2010  . Hyperlipemia 2013   Past Surgical History  Procedure Laterality Date  . Cataract extraction w/phaco  07/29/2012    Procedure: CATARACT EXTRACTION PHACO AND INTRAOCULAR LENS PLACEMENT (IOC);  Surgeon: Tonny Branch, MD;  Location: [facility];  Service: Ophthalmology;  Laterality: Left;  CDE=1.66  . Cholecystectomy  2007    [facility]  . Eye surgery Left 2013    cataract   Social History   Social History  . Marital Status: Married    Spouse Name: N/A  . Number of Children: N/A  . Years of Education: N/A   Social History Main Topics  . Smoking status: Never Smoker   . Smokeless tobacco: None  . Alcohol Use: No  . Drug Use: No  . Sexual Activity: Not Asked   Other Topics Concern  . None   Social History Narrative   Outpatient Encounter Prescriptions as of 11/08/2015  Medication Sig  . amLODipine (NORVASC) 10 MG tablet Take 1 tablet (10 mg total) by mouth daily.  Marland Kitchen aspirin EC 81 MG tablet Take 81 mg by mouth daily.  . benazepril (LOTENSIN) 20 MG tablet TAKE 1 TABLET (20 MG TOTAL) BY MOUTH DAILY.  Marland Kitchen doxycycline (VIBRA-TABS) 100 MG tablet Take 1 tablet (100 mg total) by mouth 2 (two) times daily.  . hydrochlorothiazide (HYDRODIURIL) 25 MG tablet Take 1 tablet (25 mg total) by mouth daily.  Marland Kitchen LANTUS SOLOSTAR 100 UNIT/ML Solostar Pen INJECT 40 UNITS INTO THE SKIN DAILY AT 10 PM  . Liraglutide (VICTOZA) 18 MG/3ML SOPN Inject 0.3 mLs (1.8 mg total) into the skin once.  . metFORMIN (GLUCOPHAGE) 1000 MG tablet TAKE 1 TABLET BY MOUTH TWICE DAILY  . Omega-3 Fatty Acids (FISH OIL) 1000 MG CAPS Take 1,000 mg by mouth daily.   . rosuvastatin (CRESTOR) 10 MG tablet TAKE 1 TABLET (10 MG TOTAL) BY MOUTH DAILY.  . sildenafil (VIAGRA) 100 MG tablet Take 1 tablet (100 mg total) by  mouth daily as needed for erectile dysfunction.  . terbinafine (LAMISIL) 250 MG tablet Take 1 tablet (250 mg total) by mouth daily.  . [DISCONTINUED] Liraglutide (VICTOZA) 18 MG/3ML SOLN Inject 1.8 mg into the skin.   No facility-administered encounter medications on file as of 11/08/2015.   ALLERGIES: No Known Allergies VACCINATION STATUS: Immunization History  Administered Date(s) Administered  . Influenza Split 07/17/2014  . Influenza,inj,Quad PF,36+ Mos 08/16/2015    Diabetes He presents for his follow-up diabetic visit. He has type 2 diabetes mellitus. Onset time: He was diagnosed at approximate age of 55 years. His disease course has been improving. There are no hypoglycemic associated symptoms. Pertinent negatives for hypoglycemia include no confusion, headaches, pallor or seizures. There are no diabetic associated symptoms. Pertinent negatives for diabetes include no chest pain, no fatigue, no polydipsia, no polyphagia, no polyuria and no weakness. There are no hypoglycemic complications. Symptoms are improving. There are no diabetic complications. Risk factors for coronary artery disease include diabetes mellitus, dyslipidemia, hypertension, family history and male sex. Current diabetic treatment includes insulin injections and oral agent (dual therapy) (Due to nausea vomiting he stopped his VICTOZA a month ago here at). He is compliant with treatment most of the time. His weight is stable. He is following a diabetic  diet. He participates in exercise three times a week. There is no change in his home blood glucose trend. His breakfast blood glucose range is generally 130-140 mg/dl. His overall blood glucose range is 130-140 mg/dl. An ACE inhibitor/angiotensin II receptor blocker is being taken. Eye exam is current.  Hypertension This is a chronic problem. The current episode started more than 1 year ago. The problem has been gradually improving since onset. Pertinent negatives include no  chest pain, headaches, neck pain, palpitations or shortness of breath. Past treatments include ACE inhibitors. There are no compliance problems.   Hyperlipidemia This is a chronic problem. The current episode started more than 1 year ago. Pertinent negatives include no chest pain, myalgias or shortness of breath. Current antihyperlipidemic treatment includes statins.     Review of Systems  Constitutional: Negative for fatigue and unexpected weight change.  HENT: Negative for dental problem, mouth sores and trouble swallowing.   Eyes: Negative for visual disturbance.  Respiratory: Negative for cough, choking, chest tightness, shortness of breath and wheezing.   Cardiovascular: Negative for chest pain, palpitations and leg swelling.  Gastrointestinal: Negative for nausea, vomiting, abdominal pain, diarrhea, constipation and abdominal distention.  Endocrine: Negative for polydipsia, polyphagia and polyuria.  Genitourinary: Negative for dysuria, urgency, hematuria and flank pain.  Musculoskeletal: Negative for myalgias, back pain, gait problem and neck pain.  Skin: Negative for pallor, rash and wound.  Neurological: Negative for seizures, syncope, weakness, numbness and headaches.  Psychiatric/Behavioral: Negative.  Negative for confusion and dysphoric mood.    Objective:    BP 137/73 mmHg  Pulse 76  Ht 5\' 10"  (1.778 m)  Wt 201 lb (91.173 kg)  BMI 28.84 kg/m2  SpO2 99%  Wt Readings from Last 3 Encounters:  11/08/15 201 lb (91.173 kg)  07/27/15 194 lb (87.998 kg)  05/05/15 197 lb (89.359 kg)    Physical Exam  Constitutional: He is oriented to person, place, and time. He appears well-developed and well-nourished. He is cooperative. No distress.  HENT:  Head: Normocephalic and atraumatic.  Eyes: EOM are normal.  Neck: Normal range of motion. Neck supple. No tracheal deviation present. No thyromegaly present.  Cardiovascular: Normal rate, S1 normal, S2 normal and normal heart sounds.   Exam reveals no gallop.   No murmur heard. Pulses:      Dorsalis pedis pulses are 1+ on the right side, and 1+ on the left side.       Posterior tibial pulses are 1+ on the right side, and 1+ on the left side.  Pulmonary/Chest: Breath sounds normal. No respiratory distress. He has no wheezes.  Abdominal: Soft. Bowel sounds are normal. He exhibits no distension. There is no tenderness. There is no guarding and no CVA tenderness.  Musculoskeletal: He exhibits no edema.       Right shoulder: He exhibits no swelling and no deformity.  Neurological: He is alert and oriented to person, place, and time. He has normal strength and normal reflexes. No cranial nerve deficit or sensory deficit. Gait normal.  Skin: Skin is warm and dry. No rash noted. No cyanosis. Nails show no clubbing.  Psychiatric: He has a normal mood and affect. His speech is normal and behavior is normal. Judgment and thought content normal. Cognition and memory are normal.    Results for orders placed or performed in visit on [id number]  Basic metabolic panel  Result Value Ref Range   Sodium 138 135 - 146 mmol/L   Potassium 4.2 3.5 - 5.3 mmol/L  Chloride 100 98 - 110 mmol/L   CO2 32 (H) 20 - 31 mmol/L   Glucose, Bld 115 (H) 65 - 99 mg/dL   BUN 22 7 - 25 mg/dL   Creat 0.99 0.70 - 1.33 mg/dL   Calcium 9.5 8.6 - 10.3 mg/dL  Hemoglobin A1c  Result Value Ref Range   Hgb A1c MFr Bld 7.7 (H) <5.7 %   Mean Plasma Glucose 174 (H) <117 mg/dL   Diabetic Labs (most recent): Lab Results  Component Value Date   HGBA1C 7.7* 10/27/2015   HGBA1C 7.9* 07/16/2015   HGBA1C 7.7* 03/01/2015   Lipid Panel     Component Value Date/Time   CHOL 106* 07/08/2015 0744   TRIG 59 07/08/2015 0744   HDL 40 07/08/2015 0744   CHOLHDL 2.7 07/08/2015 0744   VLDL 12 07/08/2015 0744   LDLCALC 54 07/08/2015 0744      Assessment & Plan:   1. Uncontrolled diabetes mellitus type 2 without complications, unspecified long term insulin use status  ([place]) Patient came with stable glucose profile, and  recent A1c improved to 7.7 %.  Glucose logs and insulin administration records pertaining to this visit,  to be scanned into patient's records.  Recent labs reviewed. - Patient remains at a high risk for more acute and chronic complications of diabetes which include CAD, CVA, CKD, retinopathy, and neuropathy. These are all discussed in detail with the patient.  - I have re-counseled the patient on diet management by adopting a carbohydrate restricted / protein rich  Diet. - Patient is advised to stick to a routine mealtimes to eat 3 meals  a day and avoid unnecessary snacks ( to snack only to correct hypoglycemia).  - Suggestion is made for patient to avoid simple carbohydrates   from their diet including Cakes , Desserts, Ice Cream,  Soda (  diet and regular) , Sweet Tea , Candies,  Chips, Cookies, Artificial Sweeteners,   and "Sugar-free" Products .  This will help patient to have stable blood glucose profile and potentially avoid unintended  Weight gain.  - The patient  has been  scheduled with Jearld Fenton, RDN, CDE for individualized DM education. - I have approached patient with the following individualized plan to manage diabetes and patient agrees.  - I will continue Lantus 40 units QHS . -He will not need prandial insulin for now . -He is advised to continue  monitoring of glucose  morning before breakfast.  - Patient is warned not to take insulin without proper monitoring per orders.  -Patient is encouraged to call clinic for blood glucose levels less than 70 or above 300 mg /dl. - I will increase  VICTOZA to 1.8  mg subcutaneous daily,therapeutically suitable for patient. -I will continue metformin 1000 mg by mouth twice a day.  - Patient specific target  for A1c; LDL, HDL, Triglycerides, and  Waist Circumference were discussed in detail.  2) BP/HTN: Controlled. Continue current medications including ACEI. 3) Lipids/HPL:  Recent LDL at above target levels 56, continue statins. 4)  Weight/Diet: CDE consult in progress, exercise, and carbohydrates information provided.  5) Chronic Care/Health Maintenance:  -Patient  on ACEI and Statin medications and encouraged to continue to follow up with Ophthalmology, Podiatrist at least yearly or according to recommendations, and advised to stay away from smoking. I have recommended yearly flu vaccine and pneumonia vaccination at least every 5 years; moderate intensity exercise for up to 150 minutes weekly; and  sleep for at least 7 hours  a day.  I advised patient to maintain close follow up with their PCP for primary care needs.  Patient is asked to bring meter and  blood glucose logs during their next visit.   Follow up plan: Return in about 3 months (around 02/06/2016) for diabetes, high blood pressure, high cholesterol, follow up with pre-visit labs, meter, and logs.  Glade Lloyd, MD Phone: [phone]  Fax: [phone]   11/08/2015, 10:03 AM

## 2015-11-08 NOTE — Patient Instructions (Signed)

## 2015-11-11 ENCOUNTER — Telehealth: Payer: Self-pay

## 2015-11-11 NOTE — Telephone Encounter (Signed)
Pt states that the Victoza makes him nauseous after taking it. He states he would like to continue on this medication but is asking for a prescription for phenergan or something similar to take prn.

## 2015-11-12 NOTE — Telephone Encounter (Signed)
advise him to lower Victoza to 1.2 mg daily instead of adding another medication for nausea.

## 2015-11-12 NOTE — Telephone Encounter (Signed)
Pt.notified

## 2015-12-15 MED FILL — metFORMIN HCL 1000 MG TABS: 1000 | 30 days supply | Qty: 60 | Fill #1

## 2015-12-16 MED FILL — LANTUS SOLOSTAR 100 UNITS/M: 100 | 37 days supply | Qty: 15 | Fill #1

## 2016-01-05 ENCOUNTER — Other Ambulatory Visit: Payer: Self-pay | Admitting: Family Medicine

## 2016-01-05 MED FILL — HYDROCHLOROTHIAZIDE 25 MG T: 25 | 90 days supply | Qty: 90 | Fill #0

## 2016-01-05 MED FILL — ROSUVASTATIN CALCIUM 10 MG: 10 | 90 days supply | Qty: 90 | Fill #0

## 2016-01-05 MED FILL — BENAZEPRIL HCL 20 MG TABLET: 20 | 90 days supply | Qty: 90 | Fill #0

## 2016-01-13 MED FILL — metFORMIN HCL 1000 MG TABS: 1000 | 30 days supply | Qty: 60 | Fill #2

## 2016-01-13 MED FILL — LANTUS SOLOSTAR 100 UNITS/M: 100 | 37 days supply | Qty: 15 | Fill #2

## 2016-01-19 ENCOUNTER — Ambulatory Visit (INDEPENDENT_AMBULATORY_CARE_PROVIDER_SITE_OTHER): Payer: BLUE CROSS/BLUE SHIELD | Admitting: Family Medicine

## 2016-01-19 ENCOUNTER — Encounter: Payer: Self-pay | Admitting: Family Medicine

## 2016-01-19 VITALS — BP 124/76 | HR 80 | Resp 18 | Ht 70.0 in | Wt 199.1 lb

## 2016-01-19 DIAGNOSIS — I10 Essential (primary) hypertension: Secondary | ICD-10-CM

## 2016-01-19 DIAGNOSIS — E1165 Type 2 diabetes mellitus with hyperglycemia: Secondary | ICD-10-CM

## 2016-01-19 DIAGNOSIS — E785 Hyperlipidemia, unspecified: Secondary | ICD-10-CM

## 2016-01-19 DIAGNOSIS — D539 Nutritional anemia, unspecified: Secondary | ICD-10-CM | POA: Diagnosis not present

## 2016-01-19 DIAGNOSIS — N528 Other male erectile dysfunction: Secondary | ICD-10-CM

## 2016-01-19 DIAGNOSIS — IMO0001 Reserved for inherently not codable concepts without codable children: Secondary | ICD-10-CM

## 2016-01-19 DIAGNOSIS — E669 Obesity, unspecified: Secondary | ICD-10-CM

## 2016-01-19 MED ORDER — SILDENAFIL CITRATE 100 MG PO TABS: 100.0000 mg | ORAL_TABLET | Freq: Every day | ORAL | Status: DC | PRN

## 2016-01-19 MED FILL — VIAGRA 100 MG TABLET: 100 | 30 days supply | Qty: 4 | Fill #0

## 2016-01-19 NOTE — Progress Notes (Signed)
Subjective:    Patient ID: Travis Herrera, male    DOB: 03/23/1956, 60 y.o.   MRN: [id number]  HPI   Travis Herrera     MRN: [id number]      DOB: 16-Jun-1956   HPI Travis Herrera is here for follow up and re-evaluation of chronic medical conditions, medication management and review of any available recent lab and radiology data.  Preventive health is updated, specifically  Cancer screening and Immunization.   Questions or concerns regarding consultations or procedures which the PT has had in the interim are  addressed. The PT denies any adverse reactions to current medications since the last visit.  There are no new concerns.  There are no specific complaints   ROS Denies recent fever or chills. Denies sinus pressure, nasal congestion, ear pain or sore throat. Denies chest congestion, productive cough or wheezing. Denies chest pains, palpitations and leg swelling Denies abdominal pain, nausea, vomiting,diarrhea or constipation.   Denies dysuria, frequency, hesitancy or incontinence. Denies joint pain, swelling and limitation in mobility. Denies headaches, seizures, numbness, or tingling. Denies depression, anxiety or insomnia. Denies skin break down or rash.   PE  BP 124/76 mmHg  Pulse 80  Resp 18  Ht 5\' 10"  (1.778 m)  Wt 199 lb 1.9 oz (90.32 kg)  BMI 28.57 kg/m2  SpO2 99%  Patient alert and oriented and in no cardiopulmonary distress.  HEENT: No facial asymmetry, EOMI,   oropharynx pink and moist.  Neck supple no JVD, no mass.  Chest: Clear to auscultation bilaterally.  CVS: S1, S2 no murmurs, no S3.Regular rate.  ABD: Soft non tender.   Ext: No edema  MS: Adequate ROM spine, shoulders, hips and knees.  Skin: Intact, no ulcerations or rash noted.  Psych: Good eye contact, normal affect. Memory intact not anxious or depressed appearing.  CNS: CN 2-12 intact, power,  normal throughout.no focal deficits noted.   Assessment & Plan   Benign  hypertension Controlled, no change in medication DASH diet and commitment to daily physical activity for a minimum of 30 minutes discussed and encouraged, as a part of hypertension management. The importance of attaining a healthy weight is also discussed.  BP/Weight 01/19/2016 11/08/2015 07/27/2015 05/05/2015 02/08/2015 01/28/2015 123456  Systolic BP [id number] 0000000 0000000 123456 0000000 123456 123456  Diastolic BP 76 73 86 74 70 69 74  Wt. (Lbs) 199.12 201 194 197 208 209 211  BMI 28.57 28.84 27.84 28.27 29.84 29.99 30.28        Type II diabetes mellitus, uncontrolled ([place]) Travis Herrera is reminded of the importance of commitment to daily physical activity for 30 minutes or more, as able and the need to limit carbohydrate intake to 30 to 60 grams per meal to help with blood sugar control.   The need to take medication as prescribed, test blood sugar as directed, and to call between visits if there is a concern that blood sugar is uncontrolled is also discussed.   Travis Herrera is reminded of the importance of daily foot exam, annual eye examination, and good blood sugar, blood pressure and cholesterol control. Being followed  By endo Diabetic Labs Latest Ref Rng 10/27/2015 07/16/2015 07/08/2015 05/05/2015 03/01/2015  HbA1c <5.7 % 7.7(H) 7.9(A) - - 7.7(A)  Microalbumin <2.0 mg/dL - - - 0.6 -  Micro/Creat Ratio 0.0 - 30.0 mg/g - - - 3.6 -  Chol 125 - 200 mg/dL - - 106(L) - -  HDL >=40 mg/dL - - 40 - -  Calc LDL <130 mg/dL - - 54 - -  Triglycerides <150 mg/dL - - 59 - -  Creatinine 0.70 - 1.33 mg/dL 0.99 - 1.06 - -   BP/Weight 01/19/2016 11/08/2015 07/27/2015 05/05/2015 02/08/2015 01/28/2015 123456  Systolic BP A999333 0000000 0000000 123456 0000000 123456 123456  Diastolic BP 76 73 86 74 70 69 74  Wt. (Lbs) 199.12 201 194 197 208 209 211  BMI 28.57 28.84 27.84 28.27 29.84 29.99 30.28   Foot/eye exam completion dates Latest Ref Rng 05/05/2015 07/26/2014  Eye Exam No Retinopathy - No Retinopathy  Foot Form Completion - Done -          Erectile dysfunction Continue medication as works well  Hyperlipidemia Hyperlipidemia:Low fat diet discussed and encouraged.   Lipid Panel  Lab Results  Component Value Date   CHOL 106* 07/08/2015   HDL 40 07/08/2015   LDLCALC 54 07/08/2015   TRIG 59 07/08/2015   CHOLHDL 2.7 07/08/2015   Updated lab needed at/ before next visit.     Obesity (BMI 30.0-34.9) Deteriorated. Patient re-educated about  the importance of commitment to a  minimum of 150 minutes of exercise per week.  The importance of healthy food choices with portion control discussed. Encouraged to start a food diary, count calories and to consider  joining a support group. Sample diet sheets offered. Goals set by the patient for the next several months.   Weight /BMI 01/19/2016 11/08/2015 07/27/2015  WEIGHT 199 lb 1.9 oz 201 lb 194 lb  HEIGHT 5\' 10"  5\' 10"  5\' 10"   BMI 28.57 kg/m2 28.84 kg/m2 27.84 kg/m2    Current exercise per week 90 minutes.        Review of Systems     Objective:   Physical Exam        Assessment & Plan:

## 2016-01-19 NOTE — Patient Instructions (Signed)
Preventive exam in October, call if you need me before  NEED EYE EXAM  Fasting lipid, cmp and EGFr and CBc, iron , ferritin and B12 in April with Dr Liliane Channel labs  Please work on good  health habits so that your health will improve. 1. Commitment to daily physical activity for 30 to 60  minutes, if you are able to do this.  2. Commitment to wise food choices. Aim for half of your  food intake to be vegetable and fruit, one quarter starchy foods, and one quarter protein. Try to eat on a regular schedule  3 meals per day, snacking between meals should be limited to vegetables or fruits or small portions of nuts. 64 ounces of water per day is generally recommended, unless you have specific health conditions, like heart failure or kidney failure where you will need to limit fluid intake.  3. Commitment to sufficient and a  good quality of physical and mental rest daily, generally between 6 to 8 hours per day.  WITH PERSISTANCE AND PERSEVERANCE, THE IMPOSSIBLE , BECOMES THE NORM! Thank you  for choosing [facility]. We consider it a privelige to serve you.  Delivering excellent health care in a caring and  compassionate way is our goal.  Partnering with you,  so that together we can achieve this goal is our strategy.

## 2016-01-23 NOTE — Assessment & Plan Note (Signed)
Travis Herrera is reminded of the importance of commitment to daily physical activity for 30 minutes or more, as able and the need to limit carbohydrate intake to 30 to 60 grams per meal to help with blood sugar control.   The need to take medication as prescribed, test blood sugar as directed, and to call between visits if there is a concern that blood sugar is uncontrolled is also discussed.   Travis Herrera is reminded of the importance of daily foot exam, annual eye examination, and good blood sugar, blood pressure and cholesterol control. Being followed  By endo Diabetic Labs Latest Ref Rng 10/27/2015 07/16/2015 07/08/2015 05/05/2015 03/01/2015  HbA1c <5.7 % 7.7(H) 7.9(A) - - 7.7(A)  Microalbumin <2.0 mg/dL - - - 0.6 -  Micro/Creat Ratio 0.0 - 30.0 mg/g - - - 3.6 -  Chol 125 - 200 mg/dL - - 106(L) - -  HDL >=40 mg/dL - - 40 - -  Calc LDL <130 mg/dL - - 54 - -  Triglycerides <150 mg/dL - - 59 - -  Creatinine 0.70 - 1.33 mg/dL 0.99 - 1.06 - -   BP/Weight 01/19/2016 11/08/2015 07/27/2015 05/05/2015 02/08/2015 01/28/2015 123456  Systolic BP A999333 0000000 0000000 123456 0000000 123456 123456  Diastolic BP 76 73 86 74 70 69 74  Wt. (Lbs) 199.12 201 194 197 208 209 211  BMI 28.57 28.84 27.84 28.27 29.84 29.99 30.28   Foot/eye exam completion dates Latest Ref Rng 05/05/2015 07/26/2014  Eye Exam No Retinopathy - No Retinopathy  Foot Form Completion - Done -

## 2016-01-23 NOTE — Assessment & Plan Note (Signed)
Controlled, no change in medication DASH diet and commitment to daily physical activity for a minimum of 30 minutes discussed and encouraged, as a part of hypertension management. The importance of attaining a healthy weight is also discussed.  BP/Weight 01/19/2016 11/08/2015 07/27/2015 05/05/2015 02/08/2015 01/28/2015 123456  Systolic BP A999333 0000000 0000000 123456 0000000 123456 123456  Diastolic BP 76 73 86 74 70 69 74  Wt. (Lbs) 199.12 201 194 197 208 209 211  BMI 28.57 28.84 27.84 28.27 29.84 29.99 30.28

## 2016-01-23 NOTE — Assessment & Plan Note (Signed)
Hyperlipidemia:Low fat diet discussed and encouraged.   Lipid Panel  Lab Results  Component Value Date   CHOL 106* 07/08/2015   HDL 40 07/08/2015   LDLCALC 54 07/08/2015   TRIG 59 07/08/2015   CHOLHDL 2.7 07/08/2015   Updated lab needed at/ before next visit.

## 2016-01-23 NOTE — Assessment & Plan Note (Signed)
Deteriorated. Patient re-educated about  the importance of commitment to a  minimum of 150 minutes of exercise per week.  The importance of healthy food choices with portion control discussed. Encouraged to start a food diary, count calories and to consider  joining a support group. Sample diet sheets offered. Goals set by the patient for the next several months.   Weight /BMI 01/19/2016 11/08/2015 07/27/2015  WEIGHT 199 lb 1.9 oz 201 lb 194 lb  HEIGHT 5\' 10"  5\' 10"  5\' 10"   BMI 28.57 kg/m2 28.84 kg/m2 27.84 kg/m2    Current exercise per week 90 minutes.

## 2016-01-23 NOTE — Assessment & Plan Note (Signed)
Continue medication as works well

## 2016-02-01 ENCOUNTER — Other Ambulatory Visit: Payer: Self-pay

## 2016-02-01 MED ORDER — AMLODIPINE BESYLATE 10 MG PO TABS: 10.0000 mg | ORAL_TABLET | Freq: Every day | ORAL | Status: DC

## 2016-02-01 MED FILL — AMLODIPINE BESYLATE 10 MG T: 10 | 90 days supply | Qty: 90 | Fill #0

## 2016-02-07 ENCOUNTER — Ambulatory Visit: Payer: BLUE CROSS/BLUE SHIELD | Admitting: "Endocrinology

## 2016-02-15 ENCOUNTER — Other Ambulatory Visit: Payer: Self-pay | Admitting: "Endocrinology

## 2016-02-15 MED FILL — VIAGRA 100 MG TABLET: 100 | 30 days supply | Qty: 4 | Fill #1

## 2016-02-15 MED FILL — metFORMIN HCL 1000 MG TABS: 1000 | 30 days supply | Qty: 60 | Fill #0

## 2016-02-21 MED FILL — UNIFINE PENTIPS 8MM 31G: 31G X 8 MM | 50 days supply | Qty: 100 | Fill #1

## 2016-03-23 ENCOUNTER — Other Ambulatory Visit: Payer: Self-pay | Admitting: "Endocrinology

## 2016-03-23 LAB — BASIC METABOLIC PANEL
BUN: 18 mg/dL (ref 7–25)
CHLORIDE: 105 mmol/L (ref 98–110)
CO2: 26 mmol/L (ref 20–31)
CREATININE: 1 mg/dL (ref 0.70–1.33)
Calcium: 9 mg/dL (ref 8.6–10.3)
GLUCOSE: 124 mg/dL — AB (ref 65–99)
POTASSIUM: 4.1 mmol/L (ref 3.5–5.3)
Sodium: 137 mmol/L (ref 135–146)

## 2016-03-23 LAB — HEMOGLOBIN A1C
HEMOGLOBIN A1C: 7.8 % — AB (ref <5.7)
Mean Plasma Glucose: 177 mg/dL

## 2016-03-27 MED FILL — metFORMIN HCL 1000 MG TABS: 1000 | 30 days supply | Qty: 60 | Fill #1

## 2016-03-28 MED FILL — BENAZEPRIL HCL 20 MG TABLET: 20 | 90 days supply | Qty: 90 | Fill #1

## 2016-03-28 MED FILL — ROSUVASTATIN CALCIUM 10 MG: 10 | 90 days supply | Qty: 90 | Fill #1

## 2016-03-28 MED FILL — HYDROCHLOROTHIAZIDE 25 MG T: 25 | 90 days supply | Qty: 90 | Fill #1

## 2016-04-03 ENCOUNTER — Other Ambulatory Visit: Payer: Self-pay | Admitting: "Endocrinology

## 2016-04-03 MED FILL — LANTUS SOLOSTAR 100 UNITS/M: 100 | 37 days supply | Qty: 15 | Fill #0

## 2016-04-06 ENCOUNTER — Ambulatory Visit: Payer: BLUE CROSS/BLUE SHIELD | Admitting: "Endocrinology

## 2016-04-12 ENCOUNTER — Ambulatory Visit (INDEPENDENT_AMBULATORY_CARE_PROVIDER_SITE_OTHER): Payer: BLUE CROSS/BLUE SHIELD | Admitting: "Endocrinology

## 2016-04-12 ENCOUNTER — Encounter: Payer: Self-pay | Admitting: "Endocrinology

## 2016-04-12 VITALS — BP 135/80 | HR 71 | Ht 70.0 in | Wt 205.0 lb

## 2016-04-12 DIAGNOSIS — E1165 Type 2 diabetes mellitus with hyperglycemia: Secondary | ICD-10-CM | POA: Diagnosis not present

## 2016-04-12 DIAGNOSIS — E785 Hyperlipidemia, unspecified: Secondary | ICD-10-CM

## 2016-04-12 DIAGNOSIS — I10 Essential (primary) hypertension: Secondary | ICD-10-CM

## 2016-04-12 DIAGNOSIS — IMO0001 Reserved for inherently not codable concepts without codable children: Secondary | ICD-10-CM

## 2016-04-12 NOTE — Progress Notes (Signed)
Subjective:    Patient ID: Travis Herrera, male    DOB: 23-Jun-1956,    Past Medical History  Diagnosis Date  . Diabetes mellitus without complication ([place]) Q000111Q    insulin started in 2012  . Hypertension 2010  . Hyperlipemia 2013   Past Surgical History  Procedure Laterality Date  . Cataract extraction w/phaco  07/29/2012    Procedure: CATARACT EXTRACTION PHACO AND INTRAOCULAR LENS PLACEMENT (IOC);  Surgeon: Tonny Branch, MD;  Location: [facility];  Service: Ophthalmology;  Laterality: Left;  CDE=1.66  . Cholecystectomy  2007    [facility]  . Eye surgery Left 2013    cataract   Social History   Social History  . Marital Status: Married    Spouse Name: N/A  . Number of Children: N/A  . Years of Education: N/A   Social History Main Topics  . Smoking status: Never Smoker   . Smokeless tobacco: None  . Alcohol Use: No  . Drug Use: No  . Sexual Activity: Not Asked   Other Topics Concern  . None   Social History Narrative   Outpatient Encounter Prescriptions as of 04/12/2016  Medication Sig  . amLODipine (NORVASC) 10 MG tablet Take 1 tablet (10 mg total) by mouth daily.  Marland Kitchen aspirin EC 81 MG tablet Take 81 mg by mouth daily.  . benazepril (LOTENSIN) 20 MG tablet TAKE 1 TABLET BY MOUTH DAILY.  . hydrochlorothiazide (HYDRODIURIL) 25 MG tablet TAKE 1 TABLET BY MOUTH DAILY.  Marland Kitchen LANTUS SOLOSTAR 100 UNIT/ML Solostar Pen INJECT 40 UNITS INTO THE SKIN DAILY AT 10 PM  . Liraglutide (VICTOZA) 18 MG/3ML SOPN Inject 0.3 mLs (1.8 mg total) into the skin once.  . metFORMIN (GLUCOPHAGE) 1000 MG tablet TAKE 1 TABLET BY MOUTH TWICE DAILY  . Omega-3 Fatty Acids (FISH OIL) 1000 MG CAPS Take 1,000 mg by mouth daily.   . rosuvastatin (CRESTOR) 10 MG tablet TAKE 1 TABLET BY MOUTH DAILY.  . sildenafil (VIAGRA) 100 MG tablet Take 1 tablet (100 mg total) by mouth daily as needed for erectile dysfunction.   No facility-administered encounter medications on file as of 04/12/2016.   ALLERGIES: No  Known Allergies VACCINATION STATUS: Immunization History  Administered Date(s) Administered  . Influenza Split 07/17/2014  . Influenza,inj,Quad PF,36+ Mos 08/16/2015    Diabetes He presents for his follow-up diabetic visit. He has type 2 diabetes mellitus. Onset time: He was diagnosed at approximate age of 14 years. His disease course has been stable. There are no hypoglycemic associated symptoms. Pertinent negatives for hypoglycemia include no confusion, headaches, pallor or seizures. There are no diabetic associated symptoms. Pertinent negatives for diabetes include no chest pain, no fatigue, no polydipsia, no polyphagia, no polyuria and no weakness. There are no hypoglycemic complications. Symptoms are stable. There are no diabetic complications. Risk factors for coronary artery disease include diabetes mellitus, dyslipidemia, hypertension, family history and male sex. Current diabetic treatment includes insulin injections and oral agent (dual therapy) (Due to nausea vomiting he stopped his VICTOZA a month ago here at). He is compliant with treatment most of the time. His weight is stable. He is following a diabetic diet. He participates in exercise three times a week. There is no change in his home blood glucose trend. His breakfast blood glucose range is generally 130-140 mg/dl. His overall blood glucose range is 130-140 mg/dl. An ACE inhibitor/angiotensin II receptor blocker is being taken. Eye exam is current.  Hypertension This is a chronic problem. The current  episode started more than 1 year ago. The problem has been gradually improving since onset. Pertinent negatives include no chest pain, headaches, neck pain, palpitations or shortness of breath. Past treatments include ACE inhibitors. There are no compliance problems.   Hyperlipidemia This is a chronic problem. The current episode started more than 1 year ago. Pertinent negatives include no chest pain, myalgias or shortness of breath.  Current antihyperlipidemic treatment includes statins.     Review of Systems  Constitutional: Negative for fatigue and unexpected weight change.  HENT: Negative for dental problem, mouth sores and trouble swallowing.   Eyes: Negative for visual disturbance.  Respiratory: Negative for cough, choking, chest tightness, shortness of breath and wheezing.   Cardiovascular: Negative for chest pain, palpitations and leg swelling.  Gastrointestinal: Negative for nausea, vomiting, abdominal pain, diarrhea, constipation and abdominal distention.  Endocrine: Negative for polydipsia, polyphagia and polyuria.  Genitourinary: Negative for dysuria, urgency, hematuria and flank pain.  Musculoskeletal: Negative for myalgias, back pain, gait problem and neck pain.  Skin: Negative for pallor, rash and wound.  Neurological: Negative for seizures, syncope, weakness, numbness and headaches.  Psychiatric/Behavioral: Negative.  Negative for confusion and dysphoric mood.    Objective:    BP 135/80 mmHg  Pulse 71  Ht 5\' 10"  (1.778 m)  Wt 205 lb (92.987 kg)  BMI 29.41 kg/m2  Wt Readings from Last 3 Encounters:  04/12/16 205 lb (92.987 kg)  01/19/16 199 lb 1.9 oz (90.32 kg)  11/08/15 201 lb (91.173 kg)    Physical Exam  Constitutional: He is oriented to person, place, and time. He appears well-developed and well-nourished. He is cooperative. No distress.  HENT:  Head: Normocephalic and atraumatic.  Eyes: EOM are normal.  Neck: Normal range of motion. Neck supple. No tracheal deviation present. No thyromegaly present.  Cardiovascular: Normal rate, S1 normal, S2 normal and normal heart sounds.  Exam reveals no gallop.   No murmur heard. Pulses:      Dorsalis pedis pulses are 1+ on the right side, and 1+ on the left side.       Posterior tibial pulses are 1+ on the right side, and 1+ on the left side.  Pulmonary/Chest: Breath sounds normal. No respiratory distress. He has no wheezes.  Abdominal: Soft.  Bowel sounds are normal. He exhibits no distension. There is no tenderness. There is no guarding and no CVA tenderness.  Musculoskeletal: He exhibits no edema.       Right shoulder: He exhibits no swelling and no deformity.  Neurological: He is alert and oriented to person, place, and time. He has normal strength and normal reflexes. No cranial nerve deficit or sensory deficit. Gait normal.  Skin: Skin is warm and dry. No rash noted. No cyanosis. Nails show no clubbing.  Psychiatric: He has a normal mood and affect. His speech is normal and behavior is normal. Judgment and thought content normal. Cognition and memory are normal.    Results for orders placed or performed in visit on [id number]  Basic metabolic panel  Result Value Ref Range   Sodium 137 135 - 146 mmol/L   Potassium 4.1 3.5 - 5.3 mmol/L   Chloride 105 98 - 110 mmol/L   CO2 26 20 - 31 mmol/L   Glucose, Bld 124 (H) 65 - 99 mg/dL   BUN 18 7 - 25 mg/dL   Creat 1.00 0.70 - 1.33 mg/dL   Calcium 9.0 8.6 - 10.3 mg/dL  Hemoglobin A1c  Result Value Ref Range  Hgb A1c MFr Bld 7.8 (H) <5.7 %   Mean Plasma Glucose 177 mg/dL   Diabetic Labs (most recent): Lab Results  Component Value Date   HGBA1C 7.8* 03/23/2016   HGBA1C 7.7* 10/27/2015   HGBA1C 7.9* 07/16/2015   Lipid Panel     Component Value Date/Time   CHOL 106* 07/08/2015 0744   TRIG 59 07/08/2015 0744   HDL 40 07/08/2015 0744   CHOLHDL 2.7 07/08/2015 0744   VLDL 12 07/08/2015 0744   LDLCALC 54 07/08/2015 0744      Assessment & Plan:   1. Uncontrolled diabetes mellitus type 2 without complications, unspecified long term insulin use status ([place]) Patient came with Improved fasting glucose profile, and  recent A1c Stable at 7.8% .  Glucose logs and insulin administration records pertaining to this visit,  to be scanned into patient's records.  Recent labs reviewed. - Patient remains at a high risk for more acute and chronic complications of diabetes which include  CAD, CVA, CKD, retinopathy, and neuropathy. These are all discussed in detail with the patient.  - I have re-counseled the patient on diet management by adopting a carbohydrate restricted / protein rich  Diet. - Patient is advised to stick to a routine mealtimes to eat 3 meals  a day and avoid unnecessary snacks ( to snack only to correct hypoglycemia).  - Suggestion is made for patient to avoid simple carbohydrates   from their diet including Cakes , Desserts, Ice Cream,  Soda (  diet and regular) , Sweet Tea , Candies,  Chips, Cookies, Artificial Sweeteners,   and "Sugar-free" Products .  This will help patient to have stable blood glucose profile and potentially avoid unintended  Weight gain.  - The patient  has been  scheduled with Jearld Fenton, RDN, CDE for individualized DM education. - I have approached patient with the following individualized plan to manage diabetes and patient agrees.  - I will continue Lantus 40 units QHS . -He will not need prandial insulin for now . -He is advised to continue  monitoring of glucose  morning before breakfast.  - Patient is warned not to take insulin without proper monitoring per orders.  -Patient is encouraged to call clinic for blood glucose levels less than 70 or above 300 mg /dl. - I will continue  VICTOZA 1.8  mg subcutaneous daily, therapeutically suitable for patient. -I will continue metformin 1000 mg by mouth twice a day.  - Patient specific target  for A1c; LDL, HDL, Triglycerides, and  Waist Circumference were discussed in detail.  2) BP/HTN: Controlled. Continue current medications including ACEI. 3) Lipids/HPL: Recent LDL at above target levels 56, continue statins. 4)  Weight/Diet: CDE consult in progress, exercise, and carbohydrates information provided.  5) Chronic Care/Health Maintenance:  -Patient  on ACEI and Statin medications and encouraged to continue to follow up with Ophthalmology, Podiatrist at least yearly or  according to recommendations, and advised to stay away from smoking. I have recommended yearly flu vaccine and pneumonia vaccination at least every 5 years; moderate intensity exercise for up to 150 minutes weekly; and  sleep for at least 7 hours a day.  I advised patient to maintain close follow up with their PCP for primary care needs.  Patient is asked to bring meter and  blood glucose logs during their next visit.   Follow up plan: Return in about 3 months (around 07/13/2016) for follow up with pre-visit labs, meter, and logs.  Glade Lloyd, MD Phone:  [phone]  Fax: [phone]   04/12/2016, 10:34 AM

## 2016-05-01 MED FILL — metFORMIN HCL 1000 MG TABS: 1000 | 30 days supply | Qty: 60 | Fill #2

## 2016-05-01 MED FILL — AMLODIPINE BESYLATE 10 MG T: 10 | 90 days supply | Qty: 90 | Fill #1

## 2016-05-03 ENCOUNTER — Other Ambulatory Visit: Payer: Self-pay

## 2016-05-03 MED ORDER — GLUCOSE BLOOD VI STRP: ORAL_STRIP | Status: DC

## 2016-05-03 MED ORDER — FREESTYLE LANCETS MISC: Status: AC

## 2016-05-03 MED ORDER — INSULIN PEN NEEDLE 31G X 8 MM MISC: 1.0000 | Freq: Two times a day (BID) | Status: DC

## 2016-05-03 MED FILL — CONTOUR NEXT STRIPS: 25 days supply | Qty: 100 | Fill #0

## 2016-05-26 MED FILL — LANTUS SOLOSTAR 100 UNITS/M: 100 | 37 days supply | Qty: 15 | Fill #1

## 2016-06-06 ENCOUNTER — Other Ambulatory Visit: Payer: Self-pay

## 2016-06-06 ENCOUNTER — Other Ambulatory Visit: Payer: Self-pay | Admitting: Family Medicine

## 2016-06-06 NOTE — Telephone Encounter (Signed)
Pt aware he needs to contact dr nida

## 2016-06-06 NOTE — Telephone Encounter (Signed)
Travis Herrera is calling stating that he needs a refill on his metFORMIN (GLUCOPHAGE) 1000 MG tablet he is about out, please advise?

## 2016-06-07 ENCOUNTER — Other Ambulatory Visit: Payer: Self-pay

## 2016-06-07 MED ORDER — METFORMIN HCL 1000 MG PO TABS: 1000.0000 mg | ORAL_TABLET | Freq: Two times a day (BID) | ORAL | 2 refills | Status: DC

## 2016-06-07 MED FILL — metFORMIN HCL 1000 MG TABS: 1000 | 30 days supply | Qty: 60 | Fill #0

## 2016-06-30 ENCOUNTER — Other Ambulatory Visit: Payer: Self-pay | Admitting: Family Medicine

## 2016-06-30 MED FILL — CONTOUR NEXT STRIPS: 25 days supply | Qty: 100 | Fill #1

## 2016-06-30 MED FILL — metFORMIN HCL 1000 MG TABS: 1000 | 30 days supply | Qty: 60 | Fill #1

## 2016-06-30 MED FILL — LANTUS SOLOSTAR 100 UNITS/M: 100 | 37 days supply | Qty: 15 | Fill #2

## 2016-06-30 MED FILL — HYDROCHLOROTHIAZIDE 25 MG T: 25 | 90 days supply | Qty: 90 | Fill #0

## 2016-06-30 MED FILL — ROSUVASTATIN CALCIUM 10 MG: 10 | 90 days supply | Qty: 90 | Fill #0

## 2016-06-30 MED FILL — BENAZEPRIL HCL 20 MG TABLET: 20 | 90 days supply | Qty: 90 | Fill #0

## 2016-07-10 LAB — HM DIABETES EYE EXAM

## 2016-07-18 ENCOUNTER — Other Ambulatory Visit: Payer: Self-pay | Admitting: "Endocrinology

## 2016-07-19 LAB — COMPLETE METABOLIC PANEL WITH GFR
ALT: 9 U/L (ref 9–46)
AST: 16 U/L (ref 10–35)
Albumin: 4.1 g/dL (ref 3.6–5.1)
Alkaline Phosphatase: 60 U/L (ref 40–115)
BUN: 21 mg/dL (ref 7–25)
CHLORIDE: 103 mmol/L (ref 98–110)
CO2: 25 mmol/L (ref 20–31)
CREATININE: 1.21 mg/dL (ref 0.70–1.33)
Calcium: 9.1 mg/dL (ref 8.6–10.3)
GFR, Est African American: 76 mL/min (ref >=60)
GFR, Est Non African American: 66 mL/min (ref >=60)
Glucose, Bld: 117 mg/dL — ABNORMAL HIGH (ref 65–99)
Potassium: 4.2 mmol/L (ref 3.5–5.3)
SODIUM: 138 mmol/L (ref 135–146)
Total Bilirubin: 0.6 mg/dL (ref 0.2–1.2)
Total Protein: 6.8 g/dL (ref 6.1–8.1)

## 2016-07-19 LAB — HEMOGLOBIN A1C
Hgb A1c MFr Bld: 8.3 % — ABNORMAL HIGH (ref <5.7)
Mean Plasma Glucose: 192 mg/dL

## 2016-07-20 ENCOUNTER — Encounter: Payer: Self-pay | Admitting: "Endocrinology

## 2016-07-20 ENCOUNTER — Ambulatory Visit (INDEPENDENT_AMBULATORY_CARE_PROVIDER_SITE_OTHER): Payer: BLUE CROSS/BLUE SHIELD | Admitting: "Endocrinology

## 2016-07-20 VITALS — BP 139/79 | HR 66 | Ht 70.0 in | Wt 203.0 lb

## 2016-07-20 DIAGNOSIS — E1165 Type 2 diabetes mellitus with hyperglycemia: Secondary | ICD-10-CM

## 2016-07-20 DIAGNOSIS — E782 Mixed hyperlipidemia: Secondary | ICD-10-CM

## 2016-07-20 DIAGNOSIS — I10 Essential (primary) hypertension: Secondary | ICD-10-CM | POA: Diagnosis not present

## 2016-07-20 DIAGNOSIS — IMO0001 Reserved for inherently not codable concepts without codable children: Secondary | ICD-10-CM

## 2016-07-20 NOTE — Progress Notes (Signed)
Subjective:    Patient ID: Travis Herrera, male    DOB: Dec 06, 1955,    Past Medical History:  Diagnosis Date  . Diabetes mellitus without complication ([place]) Q000111Q   insulin started in 2012  . Hyperlipemia 2013  . Hypertension 2010   Past Surgical History:  Procedure Laterality Date  . CATARACT EXTRACTION W/PHACO  07/29/2012   Procedure: CATARACT EXTRACTION PHACO AND INTRAOCULAR LENS PLACEMENT (IOC);  Surgeon: Tonny Branch, MD;  Location: [facility];  Service: Ophthalmology;  Laterality: Left;  CDE=1.66  . CHOLECYSTECTOMY  2007   [facility]  . EYE SURGERY Left 2013   cataract   Social History   Social History  . Marital status: Married    Spouse name: N/A  . Number of children: N/A  . Years of education: N/A   Social History Main Topics  . Smoking status: Never Smoker  . Smokeless tobacco: Never Used  . Alcohol use No  . Drug use: No  . Sexual activity: Not Asked   Other Topics Concern  . None   Social History Narrative  . None   Outpatient Encounter Prescriptions as of 07/20/2016  Medication Sig  . amLODipine (NORVASC) 10 MG tablet Take 1 tablet (10 mg total) by mouth daily.  Marland Kitchen aspirin EC 81 MG tablet Take 81 mg by mouth daily.  . benazepril (LOTENSIN) 20 MG tablet TAKE 1 TABLET BY MOUTH DAILY.  Marland Kitchen glucose blood (BAYER CONTOUR NEXT TEST) test strip Use as instructed 4 x daily  . hydrochlorothiazide (HYDRODIURIL) 25 MG tablet TAKE 1 TABLET BY MOUTH DAILY.  Marland Kitchen Insulin Pen Needle 31G X 8 MM MISC 1 each by Does not apply route 2 (two) times daily.  . Lancets (FREESTYLE) lancets Use as instructed bid  . LANTUS SOLOSTAR 100 UNIT/ML Solostar Pen INJECT 40 UNITS INTO THE SKIN DAILY AT 10 PM  . Liraglutide (VICTOZA) 18 MG/3ML SOPN Inject 0.3 mLs (1.8 mg total) into the skin once.  . metFORMIN (GLUCOPHAGE) 1000 MG tablet Take 1 tablet (1,000 mg total) by mouth 2 (two) times daily.  . Omega-3 Fatty Acids (FISH OIL) 1000 MG CAPS Take 1,000 mg by mouth daily.   . rosuvastatin  (CRESTOR) 10 MG tablet TAKE 1 TABLET BY MOUTH DAILY.  . sildenafil (VIAGRA) 100 MG tablet Take 1 tablet (100 mg total) by mouth daily as needed for erectile dysfunction.   No facility-administered encounter medications on file as of 07/20/2016.    ALLERGIES: No Known Allergies VACCINATION STATUS: Immunization History  Administered Date(s) Administered  . Influenza Split 07/17/2014  . Influenza,inj,Quad PF,36+ Mos 08/16/2015    Diabetes  He presents for his follow-up diabetic visit. He has type 2 diabetes mellitus. Onset time: He was diagnosed at approximate age of 74 years. His disease course has been worsening. There are no hypoglycemic associated symptoms. Pertinent negatives for hypoglycemia include no confusion, headaches, pallor or seizures. There are no diabetic associated symptoms. Pertinent negatives for diabetes include no chest pain, no fatigue, no polydipsia, no polyphagia, no polyuria and no weakness. There are no hypoglycemic complications. Symptoms are worsening. There are no diabetic complications. Risk factors for coronary artery disease include diabetes mellitus, dyslipidemia, hypertension, family history and male sex. Current diabetic treatment includes insulin injections and oral agent (dual therapy) (Due to nausea vomiting he stopped his VICTOZA a month ago here at). He is compliant with treatment most of the time. His weight is stable. He is following a diabetic diet. He participates in exercise three  times a week. There is no change in his home blood glucose trend. His breakfast blood glucose range is generally 130-140 mg/dl. His overall blood glucose range is 130-140 mg/dl. An ACE inhibitor/angiotensin II receptor blocker is being taken. Eye exam is current.  Hypertension  This is a chronic problem. The current episode started more than 1 year ago. The problem has been gradually improving since onset. Pertinent negatives include no chest pain, headaches, neck pain, palpitations  or shortness of breath. Past treatments include ACE inhibitors. There are no compliance problems.   Hyperlipidemia  This is a chronic problem. The current episode started more than 1 year ago. Pertinent negatives include no chest pain, myalgias or shortness of breath. Current antihyperlipidemic treatment includes statins.     Review of Systems  Constitutional: Negative for fatigue and unexpected weight change.  HENT: Negative for dental problem, mouth sores and trouble swallowing.   Eyes: Negative for visual disturbance.  Respiratory: Negative for cough, choking, chest tightness, shortness of breath and wheezing.   Cardiovascular: Negative for chest pain, palpitations and leg swelling.  Gastrointestinal: Negative for abdominal distention, abdominal pain, constipation, diarrhea, nausea and vomiting.  Endocrine: Negative for polydipsia, polyphagia and polyuria.  Genitourinary: Negative for dysuria, flank pain, hematuria and urgency.  Musculoskeletal: Negative for back pain, gait problem, myalgias and neck pain.  Skin: Negative for pallor, rash and wound.  Neurological: Negative for seizures, syncope, weakness, numbness and headaches.  Psychiatric/Behavioral: Negative.  Negative for confusion and dysphoric mood.    Objective:    BP 139/79   Pulse 66   Ht 5\' 10"  (1.778 m)   Wt 203 lb (92.1 kg)   BMI 29.13 kg/m   Wt Readings from Last 3 Encounters:  07/20/16 203 lb (92.1 kg)  04/12/16 205 lb (93 kg)  01/19/16 199 lb 1.9 oz (90.3 kg)    Physical Exam  Constitutional: He is oriented to person, place, and time. He appears well-developed and well-nourished. He is cooperative. No distress.  HENT:  Head: Normocephalic and atraumatic.  Eyes: EOM are normal.  Neck: Normal range of motion. Neck supple. No tracheal deviation present. No thyromegaly present.  Cardiovascular: Normal rate, S1 normal, S2 normal and normal heart sounds.  Exam reveals no gallop.   No murmur heard. Pulses:       Dorsalis pedis pulses are 1+ on the right side, and 1+ on the left side.       Posterior tibial pulses are 1+ on the right side, and 1+ on the left side.  Pulmonary/Chest: Breath sounds normal. No respiratory distress. He has no wheezes.  Abdominal: Soft. Bowel sounds are normal. He exhibits no distension. There is no tenderness. There is no guarding and no CVA tenderness.  Musculoskeletal: He exhibits no edema.       Right shoulder: He exhibits no swelling and no deformity.  Neurological: He is alert and oriented to person, place, and time. He has normal strength and normal reflexes. No cranial nerve deficit or sensory deficit. Gait normal.  Skin: Skin is warm and dry. No rash noted. No cyanosis. Nails show no clubbing.  Psychiatric: He has a normal mood and affect. His speech is normal and behavior is normal. Judgment and thought content normal. Cognition and memory are normal.    Results for orders placed or performed in visit on 07/18/16  COMPLETE METABOLIC PANEL WITH GFR  Result Value Ref Range   Sodium 138 135 - 146 mmol/L   Potassium 4.2 3.5 - 5.3 mmol/L  Chloride 103 98 - 110 mmol/L   CO2 25 20 - 31 mmol/L   Glucose, Bld 117 (H) 65 - 99 mg/dL   BUN 21 7 - 25 mg/dL   Creat 1.21 0.70 - 1.33 mg/dL   Total Bilirubin 0.6 0.2 - 1.2 mg/dL   Alkaline Phosphatase 60 40 - 115 U/L   AST 16 10 - 35 U/L   ALT 9 9 - 46 U/L   Total Protein 6.8 6.1 - 8.1 g/dL   Albumin 4.1 3.6 - 5.1 g/dL   Calcium 9.1 8.6 - 10.3 mg/dL   GFR, Est African American 76 >=60 mL/min   GFR, Est Non African American 66 >=60 mL/min  Hemoglobin A1c  Result Value Ref Range   Hgb A1c MFr Bld 8.3 (H) <5.7 %   Mean Plasma Glucose 192 mg/dL   Diabetic Labs (most recent): Lab Results  Component Value Date   HGBA1C 8.3 (H) 07/18/2016   HGBA1C 7.8 (H) 03/23/2016   HGBA1C 7.7 (H) 10/27/2015   Lipid Panel     Component Value Date/Time   CHOL 106 (L) 07/08/2015 0744   TRIG 59 07/08/2015 0744   HDL 40 07/08/2015  0744   CHOLHDL 2.7 07/08/2015 0744   VLDL 12 07/08/2015 0744   LDLCALC 54 07/08/2015 0744      Assessment & Plan:   1. Uncontrolled diabetes mellitus type 2 without complications, unspecified long term insulin use status ([place]) Patient came with Controlled fasting glucose profile, however recent A1c  is higher at 8.3 from  7.8% .  Glucose logs and insulin administration records pertaining to this visit,  to be scanned into patient's records.  Recent labs reviewed. - Patient remains at a high risk for more acute and chronic complications of diabetes which include CAD, CVA, CKD, retinopathy, and neuropathy. These are all discussed in detail with the patient.  - I have re-counseled the patient on diet management by adopting a carbohydrate restricted / protein rich  Diet. - Patient is advised to stick to a routine mealtimes to eat 3 meals  a day and avoid unnecessary snacks ( to snack only to correct hypoglycemia).  - Suggestion is made for patient to avoid simple carbohydrates   from their diet including Cakes , Desserts, Ice Cream,  Soda (  diet and regular) , Sweet Tea , Candies,  Chips, Cookies, Artificial Sweeteners,   and "Sugar-free" Products .  This will help patient to have stable blood glucose profile and potentially avoid unintended  Weight gain.  - The patient  has been  scheduled with Jearld Fenton, RDN, CDE for individualized DM education. - I have approached patient with the following individualized plan to manage diabetes and patient agrees.  -  He is likely running higher postprandial glycemic profile. He may need prandial insulin based on his readings postprandially.  - I asked him to start testing blood glucose before meals and at bedtime for one week and return with a meter and logs.  - I will continue Lantus 40 units QHS . - Patient is warned not to take insulin without proper monitoring per orders.  -Patient is encouraged to call clinic for blood glucose levels less than  70 or above 300 mg /dl. - I will continue  VICTOZA 1.8  mg subcutaneous daily, therapeutically suitable for patient. -I will continue metformin 1000 mg by mouth twice a day.  - Patient specific target  for A1c; LDL, HDL, Triglycerides, and  Waist Circumference were discussed in detail.  2) BP/HTN: Controlled. Continue current medications including ACEI. 3) Lipids/HPL: Recent LDL at above target levels 56, continue statins. 4)  Weight/Diet: CDE consult in progress, exercise, and carbohydrates information provided.  5) Chronic Care/Health Maintenance:  -Patient  on ACEI and Statin medications and encouraged to continue to follow up with Ophthalmology, Podiatrist at least yearly or according to recommendations, and advised to stay away from smoking. I have recommended yearly flu vaccine and pneumonia vaccination at least every 5 years; moderate intensity exercise for up to 150 minutes weekly; and  sleep for at least 7 hours a day.  I advised patient to maintain close follow up with their PCP for primary care needs.  Patient is asked to bring meter and  blood glucose logs during their next visit.   Follow up plan: Return in about 1 week (around 07/27/2016) for follow up with meter and logs- no labs.  Glade Lloyd, MD Phone: [phone]  Fax: [phone]   07/20/2016, 9:13 AM

## 2016-07-26 ENCOUNTER — Encounter: Payer: Self-pay | Admitting: Family Medicine

## 2016-07-26 ENCOUNTER — Ambulatory Visit (INDEPENDENT_AMBULATORY_CARE_PROVIDER_SITE_OTHER): Payer: BLUE CROSS/BLUE SHIELD | Admitting: Family Medicine

## 2016-07-26 VITALS — BP 118/74 | HR 73 | Ht 70.0 in | Wt 203.0 lb

## 2016-07-26 DIAGNOSIS — IMO0001 Reserved for inherently not codable concepts without codable children: Secondary | ICD-10-CM

## 2016-07-26 DIAGNOSIS — I10 Essential (primary) hypertension: Secondary | ICD-10-CM

## 2016-07-26 DIAGNOSIS — Z125 Encounter for screening for malignant neoplasm of prostate: Secondary | ICD-10-CM

## 2016-07-26 DIAGNOSIS — E782 Mixed hyperlipidemia: Secondary | ICD-10-CM | POA: Diagnosis not present

## 2016-07-26 DIAGNOSIS — E1165 Type 2 diabetes mellitus with hyperglycemia: Secondary | ICD-10-CM | POA: Diagnosis not present

## 2016-07-26 DIAGNOSIS — Z0000 Encounter for general adult medical examination without abnormal findings: Secondary | ICD-10-CM | POA: Insufficient documentation

## 2016-07-26 DIAGNOSIS — Z23 Encounter for immunization: Secondary | ICD-10-CM

## 2016-07-26 DIAGNOSIS — Z114 Encounter for screening for human immunodeficiency virus [HIV]: Secondary | ICD-10-CM

## 2016-07-26 NOTE — Patient Instructions (Signed)
F/u in 5.5 months, call if you need me sooner  Fasting labs this week  It is important that you exercise regularly at least 45  minutes 6 times a week. If you develop chest pain, have severe difficulty breathing, or feel very tired, stop exercising immediately and seek medical attention   Flu vaccine today    Congrats on addition to your family and all the best!

## 2016-07-28 ENCOUNTER — Ambulatory Visit (INDEPENDENT_AMBULATORY_CARE_PROVIDER_SITE_OTHER): Payer: BLUE CROSS/BLUE SHIELD | Admitting: "Endocrinology

## 2016-07-28 ENCOUNTER — Encounter: Payer: Self-pay | Admitting: "Endocrinology

## 2016-07-28 VITALS — BP 116/69 | HR 68 | Ht 70.0 in | Wt 204.0 lb

## 2016-07-28 DIAGNOSIS — E782 Mixed hyperlipidemia: Secondary | ICD-10-CM

## 2016-07-28 DIAGNOSIS — E1165 Type 2 diabetes mellitus with hyperglycemia: Secondary | ICD-10-CM | POA: Diagnosis not present

## 2016-07-28 DIAGNOSIS — I10 Essential (primary) hypertension: Secondary | ICD-10-CM | POA: Diagnosis not present

## 2016-07-28 DIAGNOSIS — IMO0001 Reserved for inherently not codable concepts without codable children: Secondary | ICD-10-CM

## 2016-07-28 DIAGNOSIS — Z794 Long term (current) use of insulin: Secondary | ICD-10-CM

## 2016-07-28 MED ORDER — INSULIN GLARGINE 100 UNIT/ML SOLOSTAR PEN: PEN_INJECTOR | SUBCUTANEOUS | 2 refills | Status: DC

## 2016-07-28 NOTE — Patient Instructions (Signed)

## 2016-07-28 NOTE — Progress Notes (Signed)
Subjective:    Patient ID: Travis Herrera, male    DOB: 13-Oct-1956,    Past Medical History:  Diagnosis Date  . Diabetes mellitus without complication ([place]) [id number]   insulin started in 2012  . Hyperlipemia 2013  . Hypertension 2010   Past Surgical History:  Procedure Laterality Date  . CATARACT EXTRACTION W/PHACO  07/29/2012   Procedure: CATARACT EXTRACTION PHACO AND INTRAOCULAR LENS PLACEMENT (IOC);  Surgeon: Tonny Branch, MD;  Location: [facility];  Service: Ophthalmology;  Laterality: Left;  CDE=1.66  . CHOLECYSTECTOMY  2007   [facility]  . EYE SURGERY Left 2013   cataract   Social History   Social History  . Marital status: Married    Spouse name: N/A  . Number of children: N/A  . Years of education: N/A   Social History Main Topics  . Smoking status: Never Smoker  . Smokeless tobacco: Never Used  . Alcohol use No  . Drug use: No  . Sexual activity: Not Asked   Other Topics Concern  . None   Social History Narrative  . None   Outpatient Encounter Prescriptions as of 07/28/2016  Medication Sig  . amLODipine (NORVASC) 10 MG tablet Take 1 tablet (10 mg total) by mouth daily.  Marland Kitchen aspirin EC 81 MG tablet Take 81 mg by mouth daily.  . benazepril (LOTENSIN) 20 MG tablet TAKE 1 TABLET BY MOUTH DAILY.  Marland Kitchen glucose blood (BAYER CONTOUR NEXT TEST) test strip Use as instructed 4 x daily  . hydrochlorothiazide (HYDRODIURIL) 25 MG tablet TAKE 1 TABLET BY MOUTH DAILY.  Marland Kitchen Insulin Pen Needle 31G X 8 MM MISC 1 each by Does not apply route 2 (two) times daily.  . Lancets (FREESTYLE) lancets Use as instructed bid  . LANTUS SOLOSTAR 100 UNIT/ML Solostar Pen INJECT 40 UNITS INTO THE SKIN DAILY AT 10 PM  . metFORMIN (GLUCOPHAGE) 1000 MG tablet Take 1 tablet (1,000 mg total) by mouth 2 (two) times daily.  . Omega-3 Fatty Acids (FISH OIL) 1000 MG CAPS Take 1,000 mg by mouth daily.   . rosuvastatin (CRESTOR) 10 MG tablet TAKE 1 TABLET BY MOUTH DAILY.  . sildenafil (VIAGRA) 100 MG tablet  Take 1 tablet (100 mg total) by mouth daily as needed for erectile dysfunction.   No facility-administered encounter medications on file as of 07/28/2016.    ALLERGIES: No Known Allergies VACCINATION STATUS: Immunization History  Administered Date(s) Administered  . Influenza Split 07/17/2014  . Influenza,inj,Quad PF,36+ Mos 08/16/2015, 07/26/2016    Diabetes  He presents for his follow-up diabetic visit. He has type 2 diabetes mellitus. Onset time: He was diagnosed at approximate age of 83 years. His disease course has been improving. There are no hypoglycemic associated symptoms. Pertinent negatives for hypoglycemia include no confusion, headaches, pallor or seizures. There are no diabetic associated symptoms. Pertinent negatives for diabetes include no chest pain, no fatigue, no polydipsia, no polyphagia, no polyuria and no weakness. There are no hypoglycemic complications. Symptoms are improving. There are no diabetic complications. Risk factors for coronary artery disease include diabetes mellitus, dyslipidemia, hypertension, family history and male sex. Current diabetic treatment includes insulin injections and oral agent (dual therapy) (Due to nausea vomiting he stopped his VICTOZA a month ago here at). He is compliant with treatment most of the time. His weight is stable. He is following a diabetic diet. He participates in exercise three times a week. There is no change in his home blood glucose trend. His breakfast blood  glucose range is generally 130-140 mg/dl. His lunch blood glucose range is generally 130-140 mg/dl. His dinner blood glucose range is generally 130-140 mg/dl. His overall blood glucose range is 130-140 mg/dl. An ACE inhibitor/angiotensin II receptor blocker is being taken. Eye exam is current.  Hypertension  This is a chronic problem. The current episode started more than 1 year ago. The problem has been gradually improving since onset. Pertinent negatives include no chest  pain, headaches, neck pain, palpitations or shortness of breath. Past treatments include ACE inhibitors. There are no compliance problems.   Hyperlipidemia  This is a chronic problem. The current episode started more than 1 year ago. Pertinent negatives include no chest pain, myalgias or shortness of breath. Current antihyperlipidemic treatment includes statins.     Review of Systems  Constitutional: Negative for fatigue and unexpected weight change.  HENT: Negative for dental problem, mouth sores and trouble swallowing.   Eyes: Negative for visual disturbance.  Respiratory: Negative for cough, choking, chest tightness, shortness of breath and wheezing.   Cardiovascular: Negative for chest pain, palpitations and leg swelling.  Gastrointestinal: Negative for abdominal distention, abdominal pain, constipation, diarrhea, nausea and vomiting.  Endocrine: Negative for polydipsia, polyphagia and polyuria.  Genitourinary: Negative for dysuria, flank pain, hematuria and urgency.  Musculoskeletal: Negative for back pain, gait problem, myalgias and neck pain.  Skin: Negative for pallor, rash and wound.  Neurological: Negative for seizures, syncope, weakness, numbness and headaches.  Psychiatric/Behavioral: Negative.  Negative for confusion and dysphoric mood.    Objective:    BP 116/69   Pulse 68   Ht 5\' 10"  (1.778 m)   Wt 204 lb (92.5 kg)   BMI 29.27 kg/m   Wt Readings from Last 3 Encounters:  07/28/16 204 lb (92.5 kg)  07/26/16 203 lb (92.1 kg)  07/20/16 203 lb (92.1 kg)    Physical Exam  Constitutional: He is oriented to person, place, and time. He appears well-developed and well-nourished. He is cooperative. No distress.  HENT:  Head: Normocephalic and atraumatic.  Eyes: EOM are normal.  Neck: Normal range of motion. Neck supple. No tracheal deviation present. No thyromegaly present.  Cardiovascular: Normal rate, S1 normal, S2 normal and normal heart sounds.  Exam reveals no gallop.    No murmur heard. Pulses:      Dorsalis pedis pulses are 1+ on the right side, and 1+ on the left side.       Posterior tibial pulses are 1+ on the right side, and 1+ on the left side.  Pulmonary/Chest: Breath sounds normal. No respiratory distress. He has no wheezes.  Abdominal: Soft. Bowel sounds are normal. He exhibits no distension. There is no tenderness. There is no guarding and no CVA tenderness.  Musculoskeletal: He exhibits no edema.       Right shoulder: He exhibits no swelling and no deformity.  Neurological: He is alert and oriented to person, place, and time. He has normal strength and normal reflexes. No cranial nerve deficit or sensory deficit. Gait normal.  Skin: Skin is warm and dry. No rash noted. No cyanosis. Nails show no clubbing.  Psychiatric: He has a normal mood and affect. His speech is normal and behavior is normal. Judgment and thought content normal. Cognition and memory are normal.    Results for orders placed or performed in visit on 07/18/16  COMPLETE METABOLIC PANEL WITH GFR  Result Value Ref Range   Sodium 138 135 - 146 mmol/L   Potassium 4.2 3.5 - 5.3 mmol/L  Chloride 103 98 - 110 mmol/L   CO2 25 20 - 31 mmol/L   Glucose, Bld 117 (H) 65 - 99 mg/dL   BUN 21 7 - 25 mg/dL   Creat 1.21 0.70 - 1.33 mg/dL   Total Bilirubin 0.6 0.2 - 1.2 mg/dL   Alkaline Phosphatase 60 40 - 115 U/L   AST 16 10 - 35 U/L   ALT 9 9 - 46 U/L   Total Protein 6.8 6.1 - 8.1 g/dL   Albumin 4.1 3.6 - 5.1 g/dL   Calcium 9.1 8.6 - 10.3 mg/dL   GFR, Est African American 76 >=60 mL/min   GFR, Est Non African American 66 >=60 mL/min  Hemoglobin A1c  Result Value Ref Range   Hgb A1c MFr Bld 8.3 (H) <5.7 %   Mean Plasma Glucose 192 mg/dL   Diabetic Labs (most recent): Lab Results  Component Value Date   HGBA1C 8.3 (H) 07/18/2016   HGBA1C 7.8 (H) 03/23/2016   HGBA1C 7.7 (H) 10/27/2015   Lipid Panel     Component Value Date/Time   CHOL 106 (L) 07/08/2015 0744   TRIG 59  07/08/2015 0744   HDL 40 07/08/2015 0744   CHOLHDL 2.7 07/08/2015 0744   VLDL 12 07/08/2015 0744   LDLCALC 54 07/08/2015 0744      Assessment & Plan:   1. Uncontrolled diabetes mellitus type 2 without complications, unspecified long term insulin use status ([place]) Patient came with Controlled fasting glucose profile, however recent A1c  is higher at 8.3%  from  7.8% .  Glucose logs and insulin administration records pertaining to this visit,  to be scanned into patient's records.  Recent labs reviewed. - Patient remains at a high risk for more acute and chronic complications of diabetes which include CAD, CVA, CKD, retinopathy, and neuropathy. These are all discussed in detail with the patient.  - I have re-counseled the patient on diet management by adopting a carbohydrate restricted / protein rich  Diet. - Patient is advised to stick to a routine mealtimes to eat 3 meals  a day and avoid unnecessary snacks ( to snack only to correct hypoglycemia).  - Suggestion is made for patient to avoid simple carbohydrates   from their diet including Cakes , Desserts, Ice Cream,  Soda (  diet and regular) , Sweet Tea , Candies,  Chips, Cookies, Artificial Sweeteners,   and "Sugar-free" Products .  This will help patient to have stable blood glucose profile and potentially avoid unintended  Weight gain.  - The patient  has been  scheduled with Jearld Fenton, RDN, CDE for individualized DM education. - I have approached patient with the following individualized plan to manage diabetes and patient agrees.  -  He Came with much better blood glucose profile averaging 122.  He will not need prandial insulin based on his readings postprandially.   - I will  switch to Lantus  40 units  To every morning with breakfast instead of at bedtime . - Patient is warned not to take insulin without proper monitoring per orders.  -Patient is encouraged to call clinic for blood glucose levels less than 70 or above 300 mg  /dl. - I will continue  VICTOZA 1.8  mg subcutaneous daily, therapeutically suitable for patient. -I will continue metformin 1000 mg by mouth twice a day.  - Patient specific target  for A1c; LDL, HDL, Triglycerides, and  Waist Circumference were discussed in detail.  2) BP/HTN: Controlled. Continue current medications including  ACEI. 3) Lipids/HPL: Recent LDL at above target levels 56, continue statins. 4)  Weight/Diet: CDE consult in progress, exercise, and carbohydrates information provided.  5) Chronic Care/Health Maintenance:  -Patient  on ACEI and Statin medications and encouraged to continue to follow up with Ophthalmology, Podiatrist at least yearly or according to recommendations, and advised to stay away from smoking. I have recommended yearly flu vaccine and pneumonia vaccination at least every 5 years; moderate intensity exercise for up to 150 minutes weekly; and  sleep for at least 7 hours a day.  I advised patient to maintain close follow up with their PCP for primary care needs.  Patient is asked to bring meter and  blood glucose logs during their next visit.   Follow up plan: Return in about 3 months (around 10/28/2016) for follow up with pre-visit labs, meter, and logs.  Glade Lloyd, MD Phone: [phone]  Fax: [phone]   07/28/2016, 11:21 AM

## 2016-07-30 NOTE — Assessment & Plan Note (Signed)
After obtaining informed consent, the vaccine is  administered by LPN.  

## 2016-07-30 NOTE — Assessment & Plan Note (Signed)

## 2016-07-30 NOTE — Progress Notes (Signed)
   CARDER BELOW     MRN: [id number]      DOB: [birth year]   HPI: Patient is in for annual physical exam. No other health concerns are expressed or addressed at the visit. Recent labs, if available are reviewed. Immunization is reviewed , and  updated if needed.    PE; Pleasant male, alert and oriented x 3, in no cardio-pulmonary distress. Afebrile. HEENT No facial trauma or asymetry. Sinuses non tender. EOMI, pupils equally reactive to light. External ears normal, tympanic membranes clear. Oropharynx moist, no exudate. Neck: supple, no adenopathy,JVD or thyromegaly.No bruits.  Chest: Clear to ascultation bilaterally.No crackles or wheezes. Non tender to palpation  Breast: No asymetry,no masses. No nipple discharge or inversion. No axillary or supraclavicular adenopathy  Cardiovascular system; Heart sounds normal,  S1 and  S2 ,no S3.  No murmur, or thrill. Apical beat not displaced Peripheral pulses normal.  Abdomen: Soft, non tender, no organomegaly or masses. No bruits. Bowel sounds normal. No guarding, tenderness or rebound.  Rectal:  Refused , will return stool cards  Musculoskeletal exam: Full ROM of spine, hips , shoulders and knees. No deformity ,swelling or crepitus noted. No muscle wasting or atrophy.   Neurologic: Cranial nerves 2 to 12 intact. Power, tone ,sensation and reflexes normal throughout. No disturbance in gait. No tremor.  Skin: Intact, no ulceration, erythema , scaling or rash noted. Pigmentation normal throughout  Psych; Normal mood and affect. Judgement and concentration normal   Assessment & Plan:  Annual physical exam Annual exam as documented. Counseling done  re healthy lifestyle involving commitment to 150 minutes exercise per week, heart healthy diet, and attaining healthy weight.The importance of adequate sleep also discussed. Regular seat belt use and home safety, is also discussed. Changes in health habits are decided  on by the patient with goals and time frames  set for achieving them. Immunization and cancer screening needs are specifically addressed at this visit.   Need for prophylactic vaccination and inoculation against influenza After obtaining informed consent, the vaccine is  administered by LPN.

## 2016-07-31 ENCOUNTER — Other Ambulatory Visit: Payer: Self-pay | Admitting: Family Medicine

## 2016-08-01 MED FILL — AMLODIPINE BESYLATE 10 MG T: 10 | 90 days supply | Qty: 90 | Fill #0

## 2016-08-08 MED FILL — metFORMIN HCL 1000 MG TABS: 1000 | 30 days supply | Qty: 60 | Fill #2

## 2016-08-23 ENCOUNTER — Other Ambulatory Visit (HOSPITAL_COMMUNITY): Payer: BLUE CROSS/BLUE SHIELD

## 2016-08-23 ENCOUNTER — Other Ambulatory Visit: Payer: Self-pay | Admitting: "Endocrinology

## 2016-08-24 MED FILL — LANTUS SOLOSTAR 100 UNITS/M: 100 | 37 days supply | Qty: 15 | Fill #0

## 2016-08-28 ENCOUNTER — Ambulatory Visit: Admit: 2016-08-28 | Payer: BLUE CROSS/BLUE SHIELD | Admitting: Ophthalmology

## 2016-08-28 SURGERY — PHACOEMULSIFICATION, CATARACT, WITH IOL INSERTION
Anesthesia: Monitor Anesthesia Care | Site: Eye | Laterality: Right

## 2016-09-11 MED FILL — metFORMIN HCL 1000 MG TABS: 1000 | 30 days supply | Qty: 60 | Fill #0

## 2016-09-26 MED FILL — VIAGRA 100 MG TABLET: 100 | 30 days supply | Qty: 4 | Fill #2

## 2016-09-26 MED FILL — CONTOUR NEXT STRIPS: 25 days supply | Qty: 100 | Fill #2

## 2016-09-28 MED FILL — LANTUS SOLOSTAR 100 UNITS/M: 100 | 37 days supply | Qty: 15 | Fill #1

## 2016-10-04 MED FILL — ROSUVASTATIN CALCIUM 10 MG: 10 | 90 days supply | Qty: 90 | Fill #1

## 2016-10-04 MED FILL — HYDROCHLOROTHIAZIDE 25 MG T: 25 | 90 days supply | Qty: 90 | Fill #1

## 2016-10-10 MED FILL — BENAZEPRIL HCL 20 MG TABLET: 20 | 90 days supply | Qty: 90 | Fill #1

## 2016-10-10 MED FILL — metFORMIN HCL 1000 MG TABS: 1000 | 30 days supply | Qty: 60 | Fill #1

## 2016-10-24 ENCOUNTER — Other Ambulatory Visit: Payer: Self-pay | Admitting: "Endocrinology

## 2016-10-24 LAB — COMPREHENSIVE METABOLIC PANEL
ALT: 11 U/L (ref 9–46)
AST: 15 U/L (ref 10–35)
Albumin: 3.8 g/dL (ref 3.6–5.1)
Alkaline Phosphatase: 53 U/L (ref 40–115)
BUN: 18 mg/dL (ref 7–25)
CALCIUM: 8.7 mg/dL (ref 8.6–10.3)
CO2: 26 mmol/L (ref 20–31)
Chloride: 105 mmol/L (ref 98–110)
Creat: 1.12 mg/dL (ref 0.70–1.33)
GLUCOSE: 93 mg/dL (ref 65–99)
POTASSIUM: 4.1 mmol/L (ref 3.5–5.3)
Sodium: 139 mmol/L (ref 135–146)
Total Bilirubin: 0.4 mg/dL (ref 0.2–1.2)
Total Protein: 6.6 g/dL (ref 6.1–8.1)

## 2016-10-24 LAB — HEMOGLOBIN A1C
Hgb A1c MFr Bld: 7.4 % — ABNORMAL HIGH (ref <5.7)
Mean Plasma Glucose: 166 mg/dL

## 2016-10-25 LAB — VITAMIN D 25 HYDROXY (VIT D DEFICIENCY, FRACTURES): VIT D 25 HYDROXY: 32 ng/mL (ref 30–100)

## 2016-10-25 LAB — MICROALBUMIN / CREATININE URINE RATIO
CREATININE, URINE: 133 mg/dL (ref 20–370)
MICROALB UR: 1.5 mg/dL
Microalb Creat Ratio: 11 mcg/mg creat (ref <30)

## 2016-10-27 MED FILL — AMLODIPINE BESYLATE 10 MG T: 10 | 90 days supply | Qty: 90 | Fill #1

## 2016-10-31 ENCOUNTER — Ambulatory Visit: Payer: BLUE CROSS/BLUE SHIELD | Admitting: "Endocrinology

## 2016-11-09 MED FILL — metFORMIN HCL 1000 MG TABS: 1000 | 30 days supply | Qty: 60 | Fill #2

## 2016-11-14 ENCOUNTER — Encounter: Payer: Self-pay | Admitting: "Endocrinology

## 2016-11-14 ENCOUNTER — Ambulatory Visit (INDEPENDENT_AMBULATORY_CARE_PROVIDER_SITE_OTHER): Payer: PRIVATE HEALTH INSURANCE | Admitting: "Endocrinology

## 2016-11-14 VITALS — BP 121/75 | HR 70 | Ht 70.0 in | Wt 210.0 lb

## 2016-11-14 DIAGNOSIS — E782 Mixed hyperlipidemia: Secondary | ICD-10-CM | POA: Diagnosis not present

## 2016-11-14 DIAGNOSIS — I10 Essential (primary) hypertension: Secondary | ICD-10-CM | POA: Diagnosis not present

## 2016-11-14 DIAGNOSIS — Z794 Long term (current) use of insulin: Secondary | ICD-10-CM

## 2016-11-14 DIAGNOSIS — IMO0001 Reserved for inherently not codable concepts without codable children: Secondary | ICD-10-CM

## 2016-11-14 DIAGNOSIS — E1165 Type 2 diabetes mellitus with hyperglycemia: Secondary | ICD-10-CM | POA: Diagnosis not present

## 2016-11-14 MED FILL — LANTUS SOLOSTAR 100 UNITS/M: 100 | 37 days supply | Qty: 15 | Fill #2

## 2016-11-14 NOTE — Patient Instructions (Signed)

## 2016-11-14 NOTE — Progress Notes (Signed)
Subjective:    Patient ID: Travis Herrera, male    DOB: Feb 20, 1956,    Past Medical History:  Diagnosis Date  . Diabetes mellitus without complication ([place]) [id number]   insulin started in 2012  . Hyperlipemia 2013  . Hypertension 2010   Past Surgical History:  Procedure Laterality Date  . CATARACT EXTRACTION W/PHACO  07/29/2012   Procedure: CATARACT EXTRACTION PHACO AND INTRAOCULAR LENS PLACEMENT (IOC);  Surgeon: Tonny Branch, MD;  Location: [facility];  Service: Ophthalmology;  Laterality: Left;  CDE=1.66  . CHOLECYSTECTOMY  2007   [facility]  . EYE SURGERY Left 2013   cataract   Social History   Social History  . Marital status: Married    Spouse name: N/A  . Number of children: N/A  . Years of education: N/A   Social History Main Topics  . Smoking status: Never Smoker  . Smokeless tobacco: Never Used  . Alcohol use No  . Drug use: No  . Sexual activity: Not Asked   Other Topics Concern  . None   Social History Narrative  . None   Outpatient Encounter Prescriptions as of 11/14/2016  Medication Sig  . amLODipine (NORVASC) 10 MG tablet TAKE 1 TABLET BY MOUTH DAILY.  Marland Kitchen aspirin EC 81 MG tablet Take 81 mg by mouth daily.  . benazepril (LOTENSIN) 20 MG tablet TAKE 1 TABLET BY MOUTH DAILY.  Marland Kitchen glucose blood (BAYER CONTOUR NEXT TEST) test strip Use as instructed 4 x daily  . hydrochlorothiazide (HYDRODIURIL) 25 MG tablet TAKE 1 TABLET BY MOUTH DAILY.  Marland Kitchen Insulin Glargine (LANTUS SOLOSTAR) 100 UNIT/ML Solostar Pen INJECT 40 UNITS INTO THE SKIN DAILY with breakfast  . Insulin Pen Needle 31G X 8 MM MISC 1 each by Does not apply route 2 (two) times daily.  . Lancets (FREESTYLE) lancets Use as instructed bid  . LANTUS SOLOSTAR 100 UNIT/ML Solostar Pen INJECT 40 UNITS INTO THE SKIN DAILY AT 10 PM  . metFORMIN (GLUCOPHAGE) 1000 MG tablet Take 1 tablet (1,000 mg total) by mouth 2 (two) times daily.  . Omega-3 Fatty Acids (FISH OIL) 1000 MG CAPS Take 1,000 mg by mouth daily.   .  rosuvastatin (CRESTOR) 10 MG tablet TAKE 1 TABLET BY MOUTH DAILY.  . sildenafil (VIAGRA) 100 MG tablet Take 1 tablet (100 mg total) by mouth daily as needed for erectile dysfunction.   No facility-administered encounter medications on file as of 11/14/2016.    ALLERGIES: No Known Allergies VACCINATION STATUS: Immunization History  Administered Date(s) Administered  . Influenza Split 07/17/2014  . Influenza,inj,Quad PF,36+ Mos 08/16/2015, 07/26/2016    Diabetes  He presents for his follow-up diabetic visit. He has type 2 diabetes mellitus. Onset time: He was diagnosed at approximate age of 78 years. His disease course has been improving. There are no hypoglycemic associated symptoms. Pertinent negatives for hypoglycemia include no confusion, headaches, pallor or seizures. There are no diabetic associated symptoms. Pertinent negatives for diabetes include no chest pain, no fatigue, no polydipsia, no polyphagia, no polyuria and no weakness. There are no hypoglycemic complications. Symptoms are improving. There are no diabetic complications. Risk factors for coronary artery disease include diabetes mellitus, dyslipidemia, hypertension, family history and male sex. Current diabetic treatment includes insulin injections and oral agent (dual therapy) (Due to nausea vomiting he stopped his VICTOZA a month ago here at). He is compliant with treatment most of the time. His weight is increasing steadily. He is following a diabetic diet. He participates in exercise  three times a week. There is no change in his home blood glucose trend. His breakfast blood glucose range is generally 130-140 mg/dl. His dinner blood glucose range is generally 130-140 mg/dl. His overall blood glucose range is 130-140 mg/dl. An ACE inhibitor/angiotensin II receptor blocker is being taken. Eye exam is current.  Hypertension  This is a chronic problem. The current episode started more than 1 year ago. The problem has been gradually  improving since onset. Pertinent negatives include no chest pain, headaches, neck pain, palpitations or shortness of breath. Past treatments include ACE inhibitors. There are no compliance problems.   Hyperlipidemia  This is a chronic problem. The current episode started more than 1 year ago. Pertinent negatives include no chest pain, myalgias or shortness of breath. Current antihyperlipidemic treatment includes statins.     Review of Systems  Constitutional: Negative for fatigue and unexpected weight change.  HENT: Negative for dental problem, mouth sores and trouble swallowing.   Eyes: Negative for visual disturbance.  Respiratory: Negative for cough, choking, chest tightness, shortness of breath and wheezing.   Cardiovascular: Negative for chest pain, palpitations and leg swelling.  Gastrointestinal: Negative for abdominal distention, abdominal pain, constipation, diarrhea, nausea and vomiting.  Endocrine: Negative for polydipsia, polyphagia and polyuria.  Genitourinary: Negative for dysuria, flank pain, hematuria and urgency.  Musculoskeletal: Negative for back pain, gait problem, myalgias and neck pain.  Skin: Negative for pallor, rash and wound.  Neurological: Negative for seizures, syncope, weakness, numbness and headaches.  Psychiatric/Behavioral: Negative.  Negative for confusion and dysphoric mood.    Objective:    BP 121/75   Pulse 70   Ht 5\' 10"  (1.778 m)   Wt 210 lb (95.3 kg)   BMI 30.13 kg/m   Wt Readings from Last 3 Encounters:  11/14/16 210 lb (95.3 kg)  07/28/16 204 lb (92.5 kg)  07/26/16 203 lb (92.1 kg)    Physical Exam  Constitutional: He is oriented to person, place, and time. He appears well-developed and well-nourished. He is cooperative. No distress.  HENT:  Head: Normocephalic and atraumatic.  Eyes: EOM are normal.  Neck: Normal range of motion. Neck supple. No tracheal deviation present. No thyromegaly present.  Cardiovascular: Normal rate, S1 normal,  S2 normal and normal heart sounds.  Exam reveals no gallop.   No murmur heard. Pulses:      Dorsalis pedis pulses are 1+ on the right side, and 1+ on the left side.       Posterior tibial pulses are 1+ on the right side, and 1+ on the left side.  Pulmonary/Chest: Breath sounds normal. No respiratory distress. He has no wheezes.  Abdominal: Soft. Bowel sounds are normal. He exhibits no distension. There is no tenderness. There is no guarding and no CVA tenderness.  Musculoskeletal: He exhibits no edema.       Right shoulder: He exhibits no swelling and no deformity.  Neurological: He is alert and oriented to person, place, and time. He has normal strength and normal reflexes. No cranial nerve deficit or sensory deficit. Gait normal.  Skin: Skin is warm and dry. No rash noted. No cyanosis. Nails show no clubbing.  Psychiatric: He has a normal mood and affect. His speech is normal and behavior is normal. Judgment and thought content normal. Cognition and memory are normal.    Results for orders placed or performed in visit on 10/24/16  Microalbumin / creatinine urine ratio  Result Value Ref Range   Creatinine, Urine 133 20 - 370  mg/dL   Microalb, Ur 1.5 Not estab mg/dL   Microalb Creat Ratio 11 <30 mcg/mg creat  Comprehensive metabolic panel  Result Value Ref Range   Sodium 139 135 - 146 mmol/L   Potassium 4.1 3.5 - 5.3 mmol/L   Chloride 105 98 - 110 mmol/L   CO2 26 20 - 31 mmol/L   Glucose, Bld 93 65 - 99 mg/dL   BUN 18 7 - 25 mg/dL   Creat 1.12 0.70 - 1.33 mg/dL   Total Bilirubin 0.4 0.2 - 1.2 mg/dL   Alkaline Phosphatase 53 40 - 115 U/L   AST 15 10 - 35 U/L   ALT 11 9 - 46 U/L   Total Protein 6.6 6.1 - 8.1 g/dL   Albumin 3.8 3.6 - 5.1 g/dL   Calcium 8.7 8.6 - 10.3 mg/dL  Hemoglobin A1c  Result Value Ref Range   Hgb A1c MFr Bld 7.4 (H) <5.7 %   Mean Plasma Glucose 166 mg/dL  VITAMIN D 25 Hydroxy (Vit-D Deficiency, Fractures)  Result Value Ref Range   Vit D, 25-Hydroxy 32 30 -  100 ng/mL   Diabetic Labs (most recent): Lab Results  Component Value Date   HGBA1C 7.4 (H) 10/24/2016   HGBA1C 8.3 (H) 07/18/2016   HGBA1C 7.8 (H) 03/23/2016   Lipid Panel     Component Value Date/Time   CHOL 106 (L) 07/08/2015 0744   TRIG 59 07/08/2015 0744   HDL 40 07/08/2015 0744   CHOLHDL 2.7 07/08/2015 0744   VLDL 12 07/08/2015 0744   LDLCALC 54 07/08/2015 0744      Assessment & Plan:   1. Uncontrolled diabetes mellitus type 2 without complications, unspecified long term insulin use status ([place]) Patient came with Controlled fasting glucose profile, And his repeat A1c is better at 7.4% improving from 8.3%.  Glucose logs and insulin administration records pertaining to this visit,  to be scanned into patient's records.  Recent labs reviewed. - Patient remains at a high risk for more acute and chronic complications of diabetes which include CAD, CVA, CKD, retinopathy, and neuropathy. These are all discussed in detail with the patient.  - I have re-counseled the patient on diet management by adopting a carbohydrate restricted / protein rich  Diet. - Patient is advised to stick to a routine mealtimes to eat 3 meals  a day and avoid unnecessary snacks ( to snack only to correct hypoglycemia).  - Suggestion is made for patient to avoid simple carbohydrates   from their diet including Cakes , Desserts, Ice Cream,  Soda (  diet and regular) , Sweet Tea , Candies,  Chips, Cookies, Artificial Sweeteners,   and "Sugar-free" Products .  This will help patient to have stable blood glucose profile and potentially avoid unintended  Weight gain.  - The patient  has been  scheduled with Jearld Fenton, RDN, CDE for individualized DM education. - I have approached patient with the following individualized plan to manage diabetes and patient agrees.  -  He Came with much better blood glucose profile . -   He will not need prandial insulin based on his readings postprandially.   - I will   continue Lantus  40 units every morning with breakfast instead of at bedtime . - Patient is warned not to take insulin without proper monitoring per orders.  -Patient is encouraged to call clinic for blood glucose levels less than 70 or above 300 mg /dl. - I will continue  VICTOZA 1.8  mg  subcutaneous daily, therapeutically suitable for patient. -I will continue metformin 1000 mg by mouth twice a day.  - Patient specific target  for A1c; LDL, HDL, Triglycerides, and  Waist Circumference were discussed in detail.  2) BP/HTN: Controlled. Continue current medications including ACEI. 3) Lipids/HPL: Recent LDL at above target levels 56, continue statins. 4)  Weight/Diet: CDE consult in progress, exercise, and carbohydrates information provided.  5) Chronic Care/Health Maintenance:  -Patient  on ACEI and Statin medications and encouraged to continue to follow up with Ophthalmology, Podiatrist at least yearly or according to recommendations, and advised to stay away from smoking. I have recommended yearly flu vaccine and pneumonia vaccination at least every 5 years; moderate intensity exercise for up to 150 minutes weekly; and  sleep for at least 7 hours a day.  I advised patient to maintain close follow up with their PCP for primary care needs.  Patient is asked to bring meter and  blood glucose logs during their next visit.   Follow up plan: Return in about 3 months (around 02/12/2017) for meter, and logs.  Glade Lloyd, MD Phone: [phone]  Fax: [phone]   11/14/2016, 9:45 AM

## 2016-11-15 MED FILL — CONTOUR NEXT STRIPS: 25 days supply | Qty: 100 | Fill #3

## 2016-12-11 ENCOUNTER — Other Ambulatory Visit: Payer: Self-pay | Admitting: Family Medicine

## 2016-12-12 ENCOUNTER — Other Ambulatory Visit: Payer: Self-pay

## 2016-12-12 ENCOUNTER — Other Ambulatory Visit: Payer: Self-pay | Admitting: Family Medicine

## 2016-12-12 ENCOUNTER — Telehealth: Payer: Self-pay | Admitting: Family Medicine

## 2016-12-12 MED ORDER — TRIAMTERENE-HCTZ 37.5-25 MG PO TABS: 1.0000 | ORAL_TABLET | Freq: Every day | ORAL | 0 refills | Status: DC

## 2016-12-12 MED ORDER — LOVASTATIN 20 MG PO TABS: 20.0000 mg | ORAL_TABLET | Freq: Every day | ORAL | 0 refills | Status: DC

## 2016-12-12 MED ORDER — BENAZEPRIL HCL 20 MG PO TABS: 20.0000 mg | ORAL_TABLET | Freq: Every day | ORAL | 0 refills | Status: DC

## 2016-12-12 MED ORDER — METFORMIN HCL 1000 MG PO TABS: 1000.0000 mg | ORAL_TABLET | Freq: Two times a day (BID) | ORAL | 0 refills | Status: DC

## 2016-12-12 NOTE — Telephone Encounter (Signed)
Patient aware and medication sent to pharmacy  

## 2016-12-12 NOTE — Telephone Encounter (Signed)
Travis Herrera is changing pharmacies from [facility] to [facility] in [place] and he needs all his generic meds sent over needs Metformin (Generic) Sent over today, also to Change Crestor to Lipitor if all possible, please advise?

## 2016-12-12 NOTE — Telephone Encounter (Signed)
Metformin sent to [facility].   Patient would like to know if Crestor and Amlodipine can be changed to an equivalent on the $4 list.

## 2016-12-12 NOTE — Telephone Encounter (Signed)
Message sent

## 2016-12-12 NOTE — Telephone Encounter (Signed)
I have entered historically maxzide and lovastatin, pls send after you let him know. Keep March 14 appt , I can re assess the blood pressure at that time, tx

## 2016-12-27 ENCOUNTER — Ambulatory Visit: Payer: BLUE CROSS/BLUE SHIELD | Admitting: Family Medicine

## 2017-01-15 MED FILL — CONTOUR NEXT STRIPS: 12 days supply | Qty: 50 | Fill #4

## 2017-01-15 MED FILL — LANTUS SOLOSTAR 100 UNITS/M: 100 | 37 days supply | Qty: 15 | Fill #3

## 2017-01-16 LAB — CBC
HCT: 37.4 % — ABNORMAL LOW (ref 38.5–50.0)
Hemoglobin: 12.3 g/dL — ABNORMAL LOW (ref 13.2–17.1)
MCH: 29.7 pg (ref 27.0–33.0)
MCHC: 32.9 g/dL (ref 32.0–36.0)
MCV: 90.3 fL (ref 80.0–100.0)
MPV: 10.3 fL (ref 7.5–12.5)
Platelets: 287 10*3/uL (ref 140–400)
RBC: 4.14 MIL/uL — ABNORMAL LOW (ref 4.20–5.80)
RDW: 13.9 % (ref 11.0–15.0)
WBC: 5.6 10*3/uL (ref 3.8–10.8)

## 2017-01-17 LAB — TSH: TSH: 1.75 m[IU]/L (ref 0.40–4.50)

## 2017-01-17 LAB — LIPID PANEL
CHOL/HDL RATIO: 3.4 ratio (ref <5.0)
Cholesterol: 124 mg/dL (ref <200)
HDL: 36 mg/dL — AB (ref >40)
LDL CALC: 76 mg/dL (ref <100)
Triglycerides: 58 mg/dL (ref <150)
VLDL: 12 mg/dL (ref <30)

## 2017-01-17 LAB — PSA: PSA: 1 ng/mL (ref <=4.0)

## 2017-01-17 LAB — HIV ANTIBODY (ROUTINE TESTING W REFLEX): HIV 1&2 Ab, 4th Generation: NONREACTIVE

## 2017-01-18 ENCOUNTER — Ambulatory Visit (INDEPENDENT_AMBULATORY_CARE_PROVIDER_SITE_OTHER): Payer: BLUE CROSS/BLUE SHIELD | Admitting: Family Medicine

## 2017-01-18 ENCOUNTER — Other Ambulatory Visit: Payer: Self-pay

## 2017-01-18 ENCOUNTER — Encounter: Payer: Self-pay | Admitting: Family Medicine

## 2017-01-18 VITALS — BP 120/78 | HR 75 | Resp 15 | Ht 70.0 in | Wt 209.0 lb

## 2017-01-18 DIAGNOSIS — E784 Other hyperlipidemia: Secondary | ICD-10-CM | POA: Diagnosis not present

## 2017-01-18 DIAGNOSIS — E7849 Other hyperlipidemia: Secondary | ICD-10-CM

## 2017-01-18 DIAGNOSIS — Z794 Long term (current) use of insulin: Secondary | ICD-10-CM

## 2017-01-18 DIAGNOSIS — Z1211 Encounter for screening for malignant neoplasm of colon: Secondary | ICD-10-CM

## 2017-01-18 DIAGNOSIS — I10 Essential (primary) hypertension: Secondary | ICD-10-CM | POA: Diagnosis not present

## 2017-01-18 DIAGNOSIS — E119 Type 2 diabetes mellitus without complications: Secondary | ICD-10-CM

## 2017-01-18 DIAGNOSIS — E663 Overweight: Secondary | ICD-10-CM

## 2017-01-18 DIAGNOSIS — IMO0001 Reserved for inherently not codable concepts without codable children: Secondary | ICD-10-CM

## 2017-01-18 LAB — HEMOCCULT GUIAC POC 1CARD (OFFICE)
Card #2 Fecal Occult Blod, POC: NEGATIVE
Card #3 Fecal Occult Blood, POC: NEGATIVE
FECAL OCCULT BLD: NEGATIVE

## 2017-01-18 MED ORDER — AMLODIPINE BESYLATE 10 MG PO TABS: 10.0000 mg | ORAL_TABLET | Freq: Every day | ORAL | 1 refills | Status: DC

## 2017-01-18 NOTE — Assessment & Plan Note (Signed)
Controlled, no change in medication n needs to increase exercise Hyperlipidemia:Low fat diet discussed and encouraged.   Lipid Panel  Lab Results  Component Value Date   CHOL 124 01/16/2017   HDL 36 (L) 01/16/2017   LDLCALC 76 01/16/2017   TRIG 58 01/16/2017   CHOLHDL 3.4 01/16/2017

## 2017-01-18 NOTE — Assessment & Plan Note (Signed)
Improved and now controlled, managed by [name] is reminded of the importance of commitment to daily physical activity for 30 minutes or more, as able and the need to limit carbohydrate intake to 30 to 60 grams per meal to help with blood sugar control.   The need to take medication as prescribed, test blood sugar as directed, and to call between visits if there is a concern that blood sugar is uncontrolled is also discussed.   Travis Herrera is reminded of the importance of daily foot exam, annual eye examination, and good blood sugar, blood pressure and cholesterol control.  Diabetic Labs Latest Ref Rng & Units 01/16/2017 10/24/2016 07/18/2016 03/23/2016 10/27/2015  HbA1c <5.7 % - 7.4(H) 8.3(H) 7.8(H) 7.7(H)  Microalbumin Not estab mg/dL - 1.5 - - -  Micro/Creat Ratio <30 mcg/mg creat - 11 - - -  Chol <200 mg/dL 124 - - - -  HDL >40 mg/dL 36(L) - - - -  Calc LDL <100 mg/dL 76 - - - -  Triglycerides <150 mg/dL 58 - - - -  Creatinine 0.70 - 1.33 mg/dL - 1.12 1.21 1.00 0.99   BP/Weight 01/18/2017 11/14/2016 07/28/2016 07/26/2016 07/20/2016 4/80/1655 12/20/4825  Systolic BP 078 675 449 201 007 121 975  Diastolic BP 78 75 69 74 79 80 76  Wt. (Lbs) 209 210 204 203 203 205 199.12  BMI 29.99 30.13 29.27 29.13 29.13 29.41 28.57   Foot/eye exam completion dates Latest Ref Rng & Units 01/18/2017 07/10/2016  Eye Exam No Retinopathy - No Retinopathy  Foot Form Completion - Done -

## 2017-01-18 NOTE — Assessment & Plan Note (Signed)
Controlled, no change in medication DASH diet and commitment to daily physical activity for a minimum of 30 minutes discussed and encouraged, as a part of hypertension management. The importance of attaining a healthy weight is also discussed.  BP/Weight 01/18/2017 11/14/2016 07/28/2016 07/26/2016 07/20/2016 11/15/8655 05/19/6961  Systolic BP 952 841 324 401 027 253 664  Diastolic BP 78 75 69 74 79 80 76  Wt. (Lbs) 209 210 204 203 203 205 199.12  BMI 29.99 30.13 29.27 29.13 29.13 29.41 28.57

## 2017-01-18 NOTE — Assessment & Plan Note (Addendum)
Unchanged Patient re-educated about  the importance of commitment to a  minimum of 150 minutes of exercise per week.  The importance of healthy food choices with portion control discussed. Encouraged to start a food diary, count calories and to consider  joining a support group. Sample diet sheets offered. Goals set by the patient for the next several months.   Weight /BMI 01/18/2017 11/14/2016 07/28/2016  WEIGHT 209 lb 210 lb 204 lb  HEIGHT 5\' 10"  5\' 10"  5\' 10"   BMI 29.99 kg/m2 30.13 kg/m2 29.27 kg/m2

## 2017-01-18 NOTE — Patient Instructions (Signed)
f/U in 6 month, call if you need me before  Excellent exam and results  Please work on good  health habits so that your health will improve. 1. Commitment to daily physical activity for 30 to 60  minutes, if you are able to do this.  2. Commitment to wise food choices. Aim for half of your  food intake to be vegetable and fruit, one quarter starchy foods, and one quarter protein. Try to eat on a regular schedule  3 meals per day, snacking between meals should be limited to vegetables or fruits or small portions of nuts. 64 ounces of water per day is generally recommended, unless you have specific health conditions, like heart failure or kidney failure where you will need to limit fluid intake.  3. Commitment to sufficient and a  good quality of physical and mental rest daily, generally between 6 to 8 hours per day.  WITH PERSISTANCE AND PERSEVERANCE, THE IMPOSSIBLE , BECOMES THE NORM! Thank you  for choosing [facility]. We consider it a privelige to serve you.  Delivering excellent health care in a caring and  compassionate way is our goal.  Partnering with you,  so that together we can achieve this goal is our strategy.

## 2017-01-18 NOTE — Progress Notes (Signed)
Travis Herrera     MRN: [id number]      DOB: 1955-11-29   HPI Travis Herrera is here for follow up and re-evaluation of chronic medical conditions, medication management and review of any available recent lab and radiology data.  Preventive health is updated, specifically  Cancer screening and Immunization.   Questions or concerns regarding consultations or procedures which the PT has had in the interim are  addressed. The PT denies any adverse reactions to current medications since the last visit.  There are no new concerns.  There are no specific complaints  Denies polyuria, polydipsia, blurred vision , or hypoglycemic episodes.  ROS Denies recent fever or chills. Denies sinus pressure, nasal congestion, ear pain or sore throat. Denies chest congestion, productive cough or wheezing. Denies chest pains, palpitations and leg swelling Denies abdominal pain, nausea, vomiting,diarrhea or constipation.   Denies dysuria, frequency, hesitancy or incontinence. Denies joint pain, swelling and limitation in mobility. Denies headaches, seizures, numbness, or tingling. Denies depression, anxiety or insomnia. Denies skin break down or rash.   PE  BP 120/78   Pulse 75   Resp 15   Ht 5\' 10"  (1.778 m)   Wt 209 lb (94.8 kg)   SpO2 98%   BMI 29.99 kg/m   Patient alert and oriented and in no cardiopulmonary distress.  HEENT: No facial asymmetry, EOMI,   oropharynx pink and moist.  Neck supple no JVD, no mass.  Chest: Clear to auscultation bilaterally.  CVS: S1, S2 no murmurs, no S3.Regular rate.  ABD: Soft non tender.   Ext: No edema  MS: Adequate ROM spine, shoulders, hips and knees.  Skin: Intact, no ulcerations or rash noted.  Psych: Good eye contact, normal affect. Memory intact not anxious or depressed appearing.  CNS: CN 2-12 intact, power,  normal throughout.no focal deficits noted.   Assessment & Plan  Benign hypertension Controlled, no change in medication DASH  diet and commitment to daily physical activity for a minimum of 30 minutes discussed and encouraged, as a part of hypertension management. The importance of attaining a healthy weight is also discussed.  BP/Weight 01/18/2017 11/14/2016 07/28/2016 07/26/2016 07/20/2016 7/00/1749 01/17/9674  Systolic BP 916 384 665 993 570 177 939  Diastolic BP 78 75 69 74 79 80 76  Wt. (Lbs) 209 210 204 203 203 205 199.12  BMI 29.99 30.13 29.27 29.13 29.13 29.41 28.57       Diabetes mellitus, insulin dependent (IDDM), controlled ([place]) Improved and now controlled, managed by [name] is reminded of the importance of commitment to daily physical activity for 30 minutes or more, as able and the need to limit carbohydrate intake to 30 to 60 grams per meal to help with blood sugar control.   The need to take medication as prescribed, test blood sugar as directed, and to call between visits if there is a concern that blood sugar is uncontrolled is also discussed.   Travis Herrera is reminded of the importance of daily foot exam, annual eye examination, and good blood sugar, blood pressure and cholesterol control.  Diabetic Labs Latest Ref Rng & Units 01/16/2017 10/24/2016 07/18/2016 03/23/2016 10/27/2015  HbA1c <5.7 % - 7.4(H) 8.3(H) 7.8(H) 7.7(H)  Microalbumin Not estab mg/dL - 1.5 - - -  Micro/Creat Ratio <30 mcg/mg creat - 11 - - -  Chol <200 mg/dL 124 - - - -  HDL >40 mg/dL 36(L) - - - -  Calc LDL <100 mg/dL 76 - - - -  Triglycerides <150 mg/dL 58 - - - -  Creatinine 0.70 - 1.33 mg/dL - 1.12 1.21 1.00 0.99   BP/Weight 01/18/2017 11/14/2016 07/28/2016 07/26/2016 07/20/2016 6/81/1572 03/17/354  Systolic BP 974 163 845 364 680 321 224  Diastolic BP 78 75 69 74 79 80 76  Wt. (Lbs) 209 210 204 203 203 205 199.12  BMI 29.99 30.13 29.27 29.13 29.13 29.41 28.57   Foot/eye exam completion dates Latest Ref Rng & Units 01/18/2017 07/10/2016  Eye Exam No Retinopathy - No Retinopathy  Foot Form Completion - Done -         Hyperlipidemia Controlled, no change in medication n needs to increase exercise Hyperlipidemia:Low fat diet discussed and encouraged.   Lipid Panel  Lab Results  Component Value Date   CHOL 124 01/16/2017   HDL 36 (L) 01/16/2017   LDLCALC 76 01/16/2017   TRIG 58 01/16/2017   CHOLHDL 3.4 01/16/2017       Overweight (BMI 25.0-29.9) Unchanged Patient re-educated about  the importance of commitment to a  minimum of 150 minutes of exercise per week.  The importance of healthy food choices with portion control discussed. Encouraged to start a food diary, count calories and to consider  joining a support group. Sample diet sheets offered. Goals set by the patient for the next several months.   Weight /BMI 01/18/2017 11/14/2016 07/28/2016  WEIGHT 209 lb 210 lb 204 lb  HEIGHT 5\' 10"  5\' 10"  5\' 10"   BMI 29.99 kg/m2 30.13 kg/m2 29.27 kg/m2

## 2017-01-22 ENCOUNTER — Telehealth: Payer: Self-pay

## 2017-01-22 NOTE — Telephone Encounter (Signed)
Needs something called in for motion sickness to the pharmacy. Going on a trip soon.

## 2017-01-22 NOTE — Telephone Encounter (Signed)
Prescription med (trans scopolamine) not covered and very expensive has been my experience, OTC dramamine work just as well, and is safe , let him know

## 2017-01-23 NOTE — Telephone Encounter (Signed)
Patient aware.

## 2017-02-09 ENCOUNTER — Other Ambulatory Visit: Payer: Self-pay

## 2017-02-09 MED ORDER — GLUCOSE BLOOD VI STRP: ORAL_STRIP | 3 refills | Status: DC

## 2017-02-15 ENCOUNTER — Ambulatory Visit: Payer: PRIVATE HEALTH INSURANCE | Admitting: "Endocrinology

## 2017-02-26 ENCOUNTER — Other Ambulatory Visit: Payer: Self-pay | Admitting: "Endocrinology

## 2017-02-26 ENCOUNTER — Other Ambulatory Visit: Payer: Self-pay

## 2017-02-26 ENCOUNTER — Telehealth: Payer: Self-pay | Admitting: Family Medicine

## 2017-02-26 MED FILL — LANTUS SOLOSTAR 100 UNITS/M: 100 | 38 days supply | Qty: 15 | Fill #0

## 2017-03-05 ENCOUNTER — Other Ambulatory Visit: Payer: Self-pay | Admitting: Family Medicine

## 2017-03-06 ENCOUNTER — Other Ambulatory Visit: Payer: Self-pay

## 2017-03-06 MED ORDER — METFORMIN HCL 1000 MG PO TABS: 1000.0000 mg | ORAL_TABLET | Freq: Two times a day (BID) | ORAL | 1 refills | Status: DC

## 2017-03-14 ENCOUNTER — Other Ambulatory Visit: Payer: Self-pay | Admitting: "Endocrinology

## 2017-03-14 LAB — COMPREHENSIVE METABOLIC PANEL
ALBUMIN: 4.1 g/dL (ref 3.6–5.1)
ALT: 12 U/L (ref 9–46)
AST: 15 U/L (ref 10–35)
Alkaline Phosphatase: 57 U/L (ref 40–115)
BILIRUBIN TOTAL: 0.4 mg/dL (ref 0.2–1.2)
BUN: 26 mg/dL — AB (ref 7–25)
CHLORIDE: 106 mmol/L (ref 98–110)
CO2: 29 mmol/L (ref 20–31)
CREATININE: 1.44 mg/dL — AB (ref 0.70–1.33)
Calcium: 9.4 mg/dL (ref 8.6–10.3)
GLUCOSE: 141 mg/dL — AB (ref 65–99)
Potassium: 4.7 mmol/L (ref 3.5–5.3)
SODIUM: 141 mmol/L (ref 135–146)
Total Protein: 6.9 g/dL (ref 6.1–8.1)

## 2017-03-15 LAB — HEMOGLOBIN A1C
Hgb A1c MFr Bld: 7.7 % — ABNORMAL HIGH (ref <5.7)
Mean Plasma Glucose: 174 mg/dL

## 2017-03-20 ENCOUNTER — Ambulatory Visit (INDEPENDENT_AMBULATORY_CARE_PROVIDER_SITE_OTHER): Payer: BLUE CROSS/BLUE SHIELD | Admitting: "Endocrinology

## 2017-03-20 ENCOUNTER — Encounter: Payer: Self-pay | Admitting: "Endocrinology

## 2017-03-20 VITALS — BP 111/73 | HR 73 | Ht 70.0 in | Wt 212.0 lb

## 2017-03-20 DIAGNOSIS — N182 Chronic kidney disease, stage 2 (mild): Secondary | ICD-10-CM

## 2017-03-20 DIAGNOSIS — E1122 Type 2 diabetes mellitus with diabetic chronic kidney disease: Secondary | ICD-10-CM | POA: Diagnosis not present

## 2017-03-20 DIAGNOSIS — E782 Mixed hyperlipidemia: Secondary | ICD-10-CM | POA: Diagnosis not present

## 2017-03-20 DIAGNOSIS — Z794 Long term (current) use of insulin: Secondary | ICD-10-CM | POA: Diagnosis not present

## 2017-03-20 DIAGNOSIS — I10 Essential (primary) hypertension: Secondary | ICD-10-CM

## 2017-03-20 NOTE — Patient Instructions (Signed)

## 2017-03-20 NOTE — Progress Notes (Signed)
Subjective:    Patient ID: Travis Herrera, male    DOB: 21-May-1956,    Past Medical History:  Diagnosis Date  . Diabetes mellitus without complication ([place]) 5726   insulin started in 2012  . Hyperlipemia 2013  . Hypertension 2010   Past Surgical History:  Procedure Laterality Date  . CATARACT EXTRACTION W/PHACO  07/29/2012   Procedure: CATARACT EXTRACTION PHACO AND INTRAOCULAR LENS PLACEMENT (IOC);  Surgeon: Tonny Branch, MD;  Location: [facility];  Service: Ophthalmology;  Laterality: Left;  CDE=1.66  . CHOLECYSTECTOMY  2007   [facility]  . EYE SURGERY Left 2013   cataract   Social History   Social History  . Marital status: Married    Spouse name: N/A  . Number of children: N/A  . Years of education: N/A   Social History Main Topics  . Smoking status: Never Smoker  . Smokeless tobacco: Never Used  . Alcohol use No  . Drug use: No  . Sexual activity: Not Asked   Other Topics Concern  . None   Social History Narrative  . None   Outpatient Encounter Prescriptions as of 03/20/2017  Medication Sig  . amLODipine (NORVASC) 10 MG tablet Take 1 tablet (10 mg total) by mouth daily. 1 tablet daily  . aspirin EC 81 MG tablet Take 81 mg by mouth daily.  . benazepril (LOTENSIN) 20 MG tablet Take 1 tablet (20 mg total) by mouth daily.  Marland Kitchen glucose blood (BAYER CONTOUR NEXT TEST) test strip Use as instructed 4 x daily  . hydrochlorothiazide (HYDRODIURIL) 25 MG tablet TAKE 1 TABLET BY MOUTH DAILY.  Marland Kitchen Insulin Glargine (LANTUS SOLOSTAR) 100 UNIT/ML Solostar Pen INJECT 40 UNITS INTO THE SKIN DAILY with breakfast  . Insulin Pen Needle 31G X 8 MM MISC 1 each by Does not apply route 2 (two) times daily.  . Lancets (FREESTYLE) lancets Use as instructed bid  . lovastatin (MEVACOR) 20 MG tablet Take 1 tablet (20 mg total) by mouth at bedtime.  . Omega-3 Fatty Acids (FISH OIL) 1000 MG CAPS Take 1,000 mg by mouth daily.   . sildenafil (VIAGRA) 100 MG tablet Take 1 tablet (100 mg total) by  mouth daily as needed for erectile dysfunction.  . triamterene-hydrochlorothiazide (MAXZIDE-25) 37.5-25 MG tablet Take 1 tablet by mouth daily.  . [DISCONTINUED] Insulin Glargine (LANTUS SOLOSTAR) 100 UNIT/ML Solostar Pen Inject 40 Units into the skin every morning.  . [DISCONTINUED] metFORMIN (GLUCOPHAGE) 1000 MG tablet Take 1 tablet (1,000 mg total) by mouth 2 (two) times daily.   No facility-administered encounter medications on file as of 03/20/2017.    ALLERGIES: No Known Allergies VACCINATION STATUS: Immunization History  Administered Date(s) Administered  . Influenza Split 07/17/2014  . Influenza,inj,Quad PF,36+ Mos 08/16/2015, 07/26/2016    Diabetes  He presents for his follow-up diabetic visit. He has type 2 diabetes mellitus. Onset time: He was diagnosed at approximate age of 33 years. His disease course has been stable. There are no hypoglycemic associated symptoms. Pertinent negatives for hypoglycemia include no confusion, headaches, pallor or seizures. There are no diabetic associated symptoms. Pertinent negatives for diabetes include no chest pain, no fatigue, no polydipsia, no polyphagia, no polyuria and no weakness. There are no hypoglycemic complications. Symptoms are stable. There are no diabetic complications. Risk factors for coronary artery disease include diabetes mellitus, dyslipidemia, hypertension, family history and male sex. Current diabetic treatment includes insulin injections and oral agent (monotherapy). He is compliant with treatment most of the time. His  weight is increasing steadily. He is following a generally unhealthy diet. He participates in exercise intermittently. There is no change in his home blood glucose trend. His breakfast blood glucose range is generally 130-140 mg/dl. His dinner blood glucose range is generally 130-140 mg/dl. His overall blood glucose range is 130-140 mg/dl. An ACE inhibitor/angiotensin II receptor blocker is being taken. Eye exam is  current.  Hypertension  This is a chronic problem. The current episode started more than 1 year ago. The problem has been gradually improving since onset. Pertinent negatives include no chest pain, headaches, neck pain, palpitations or shortness of breath. Past treatments include ACE inhibitors. There are no compliance problems.   Hyperlipidemia  This is a chronic problem. The current episode started more than 1 year ago. Pertinent negatives include no chest pain, myalgias or shortness of breath. Current antihyperlipidemic treatment includes statins.     Review of Systems  Constitutional: Negative for fatigue and unexpected weight change.  HENT: Negative for dental problem, mouth sores and trouble swallowing.   Eyes: Negative for visual disturbance.  Respiratory: Negative for cough, choking, chest tightness, shortness of breath and wheezing.   Cardiovascular: Negative for chest pain, palpitations and leg swelling.  Gastrointestinal: Negative for abdominal distention, abdominal pain, constipation, diarrhea, nausea and vomiting.  Endocrine: Negative for polydipsia, polyphagia and polyuria.  Genitourinary: Negative for dysuria, flank pain, hematuria and urgency.  Musculoskeletal: Negative for back pain, gait problem, myalgias and neck pain.  Skin: Negative for pallor, rash and wound.  Neurological: Negative for seizures, syncope, weakness, numbness and headaches.  Psychiatric/Behavioral: Negative.  Negative for confusion and dysphoric mood.    Objective:    BP 111/73   Pulse 73   Ht 5\' 10"  (1.778 m)   Wt 212 lb (96.2 kg)   BMI 30.42 kg/m   Wt Readings from Last 3 Encounters:  03/20/17 212 lb (96.2 kg)  01/18/17 209 lb (94.8 kg)  11/14/16 210 lb (95.3 kg)    Physical Exam  Constitutional: He is oriented to person, place, and time. He appears well-developed and well-nourished. He is cooperative. No distress.  HENT:  Head: Normocephalic and atraumatic.  Eyes: EOM are normal.  Neck:  Normal range of motion. Neck supple. No tracheal deviation present. No thyromegaly present.  Cardiovascular: Normal rate, S1 normal, S2 normal and normal heart sounds.  Exam reveals no gallop.   No murmur heard. Pulses:      Dorsalis pedis pulses are 1+ on the right side, and 1+ on the left side.       Posterior tibial pulses are 1+ on the right side, and 1+ on the left side.  Pulmonary/Chest: Breath sounds normal. No respiratory distress. He has no wheezes.  Abdominal: Soft. Bowel sounds are normal. He exhibits no distension. There is no tenderness. There is no guarding and no CVA tenderness.  Musculoskeletal: He exhibits no edema.       Right shoulder: He exhibits no swelling and no deformity.  Neurological: He is alert and oriented to person, place, and time. He has normal strength and normal reflexes. No cranial nerve deficit or sensory deficit. Gait normal.  Skin: Skin is warm and dry. No rash noted. No cyanosis. Nails show no clubbing.  Psychiatric: He has a normal mood and affect. His speech is normal and behavior is normal. Judgment and thought content normal. Cognition and memory are normal.    Results for orders placed or performed in visit on 03/14/17  Comprehensive metabolic panel  Result Value  Ref Range   Sodium 141 135 - 146 mmol/L   Potassium 4.7 3.5 - 5.3 mmol/L   Chloride 106 98 - 110 mmol/L   CO2 29 20 - 31 mmol/L   Glucose, Bld 141 (H) 65 - 99 mg/dL   BUN 26 (H) 7 - 25 mg/dL   Creat 1.44 (H) 0.70 - 1.33 mg/dL   Total Bilirubin 0.4 0.2 - 1.2 mg/dL   Alkaline Phosphatase 57 40 - 115 U/L   AST 15 10 - 35 U/L   ALT 12 9 - 46 U/L   Total Protein 6.9 6.1 - 8.1 g/dL   Albumin 4.1 3.6 - 5.1 g/dL   Calcium 9.4 8.6 - 10.3 mg/dL  Hemoglobin A1c  Result Value Ref Range   Hgb A1c MFr Bld 7.7 (H) <5.7 %   Mean Plasma Glucose 174 mg/dL   Diabetic Labs (most recent): Lab Results  Component Value Date   HGBA1C 7.7 (H) 03/14/2017   HGBA1C 7.4 (H) 10/24/2016   HGBA1C 8.3 (H)  07/18/2016   Lipid Panel     Component Value Date/Time   CHOL 124 01/16/2017 0940   TRIG 58 01/16/2017 0940   HDL 36 (L) 01/16/2017 0940   CHOLHDL 3.4 01/16/2017 0940   VLDL 12 01/16/2017 0940   LDLCALC 76 01/16/2017 0940      Assessment & Plan:   1. Uncontrolled diabetes mellitus type 2 without complications, unspecified long term insulin use status ([place]) Patient came with Controlled fasting glucose profile, And his repeat A1c is Stable at 7.7%.   Glucose logs and insulin administration records pertaining to this visit,  to be scanned into patient's records.  Recent labs reviewed, showing mild renal insufficiency. - Patient remains at a high risk for more acute and chronic complications of diabetes which include CAD, CVA, CKD, retinopathy, and neuropathy. These are all discussed in detail with the patient.  - I have re-counseled the patient on diet management by adopting a carbohydrate restricted / protein rich  Diet. - Patient is advised to stick to a routine mealtimes to eat 3 meals  a day and avoid unnecessary snacks ( to snack only to correct hypoglycemia).  - Suggestion is made for patient to avoid simple carbohydrates   from his diet including Cakes , Desserts, Ice Cream,  Soda (  diet and regular) , Sweet Tea , Candies,  Chips, Cookies, Artificial Sweeteners,   and "Sugar-free" Products .  This will help patient to have stable blood glucose profile and potentially avoid unintended  Weight gain.  - The patient  has been  scheduled with Jearld Fenton, RDN, CDE for individualized DM education. - I have approached patient with the following individualized plan to manage diabetes and patient agrees.   -   He will not need prandial insulin based on his readings postprandially.   - I will  continue Lantus  40 units every morning with breakfast instead of at bedtime . - Patient is warned not to take insulin without proper monitoring per orders.  -Patient is encouraged to call  clinic for blood glucose levels less than 70 or above 300 mg /dl. - I will continue  VICTOZA 1.8  mg subcutaneous daily, therapeutically suitable for patient. -I   advised him to hold off on metformin until renal function improves.  - Patient specific target  for A1c; LDL, HDL, Triglycerides, and  Waist Circumference were discussed in detail.  2) BP/HTN: Controlled. Continue current medications including ACEI. 3) Lipids/HPL: Recent LDL at  above target levels 56, continue statins. 4)  Weight/Diet: CDE consult in progress, exercise, and carbohydrates information provided.  5) Chronic Care/Health Maintenance:  -Patient  on ACEI and Statin medications and encouraged to continue to follow up with Ophthalmology, Podiatrist at least yearly or according to recommendations, and advised to stay away from smoking. I have recommended yearly flu vaccine and pneumonia vaccination at least every 5 years; moderate intensity exercise for up to 150 minutes weekly; and  sleep for at least 7 hours a day.  I advised patient to maintain close follow up with their PCP for primary care needs.  Patient is asked to bring meter and  blood glucose logs during his next visit.   Follow up plan: Return in about 3 months (around 06/20/2017) for follow up with pre-visit labs, meter, and logs.  Glade Lloyd, MD Phone: [phone]  Fax: [phone]   03/20/2017, 8:52 AM

## 2017-03-26 ENCOUNTER — Telehealth: Payer: Self-pay | Admitting: "Endocrinology

## 2017-03-26 NOTE — Telephone Encounter (Signed)
Called pt for BG readings. He did not have these at the time. He will call back with 3 days of BG readings.

## 2017-03-26 NOTE — Telephone Encounter (Signed)
Travis Herrera is calling stating that Dr. Dorris Fetch took him off of his metformin for 3 months due to dehydration and now his blood surgars are running high and he is asking if he should adjust medications, please advise?

## 2017-03-27 NOTE — Telephone Encounter (Signed)
Pt states he has had high BG readings since he stopped the Metformin   Date Before breakfast Before lunch Before supper Bedtime  6/9 172   204  6/10 160   192  6/11 164   182  6/12 171       Pt taking: Lantus 40 units qam, Victoza 1.8mg  qd

## 2017-03-27 NOTE — Telephone Encounter (Signed)
Advise for him to increase Lantus to 50 units every morning, continue Victoza 1.8 mg daily.

## 2017-03-27 NOTE — Telephone Encounter (Signed)
Pt.notified

## 2017-04-02 MED FILL — LANTUS SOLOSTAR 100 UNITS/M: 100 | 38 days supply | Qty: 15 | Fill #1

## 2017-04-03 ENCOUNTER — Other Ambulatory Visit: Payer: Self-pay | Admitting: Family Medicine

## 2017-04-06 ENCOUNTER — Other Ambulatory Visit: Payer: Self-pay | Admitting: Family Medicine

## 2017-05-02 MED FILL — LANTUS SOLOSTAR 100 UNITS/M: 100 | 38 days supply | Qty: 15 | Fill #2

## 2017-06-01 ENCOUNTER — Other Ambulatory Visit: Payer: Self-pay | Admitting: "Endocrinology

## 2017-06-01 MED FILL — LANTUS SOLOSTAR 100 UNITS/M: 100 | 38 days supply | Qty: 15 | Fill #0

## 2017-06-20 ENCOUNTER — Ambulatory Visit: Payer: BLUE CROSS/BLUE SHIELD | Admitting: "Endocrinology

## 2017-06-28 ENCOUNTER — Other Ambulatory Visit: Payer: Self-pay | Admitting: Family Medicine

## 2017-06-28 NOTE — Telephone Encounter (Signed)
Seen 6 5 18

## 2017-07-03 MED FILL — LANTUS SOLOSTAR 100 UNITS/M: 100 | 38 days supply | Qty: 15 | Fill #1

## 2017-07-04 ENCOUNTER — Telehealth: Payer: Self-pay | Admitting: Family Medicine

## 2017-07-04 ENCOUNTER — Other Ambulatory Visit: Payer: Self-pay

## 2017-07-04 LAB — RENAL FUNCTION PANEL
ALBUMIN MSPROF: 4.4 g/dL (ref 3.6–5.1)
BUN/Creatinine Ratio: 15 (calc) (ref 6–22)
BUN: 21 mg/dL (ref 7–25)
CHLORIDE: 104 mmol/L (ref 98–110)
CO2: 27 mmol/L (ref 20–32)
CREATININE: 1.38 mg/dL — AB (ref 0.70–1.33)
Calcium: 9.4 mg/dL (ref 8.6–10.3)
GLUCOSE: 165 mg/dL — AB (ref 65–99)
PHOSPHORUS: 3.3 mg/dL (ref 2.5–4.5)
Potassium: 4.4 mmol/L (ref 3.5–5.3)
Sodium: 138 mmol/L (ref 135–146)

## 2017-07-04 LAB — HEMOGLOBIN A1C
EAG (MMOL/L): 9.8 (calc)
HEMOGLOBIN A1C: 7.8 %{Hb} — AB (ref <5.7)
MEAN PLASMA GLUCOSE: 177 (calc)

## 2017-07-04 MED ORDER — LIRAGLUTIDE 18 MG/3ML ~~LOC~~ SOPN: 1.8000 mg | PEN_INJECTOR | Freq: Every day | SUBCUTANEOUS | 2 refills | Status: DC

## 2017-07-04 MED ORDER — INSULIN PEN NEEDLE 31G X 8 MM MISC: 1.0000 | Freq: Two times a day (BID) | 5 refills | Status: AC

## 2017-07-04 MED FILL — VICTOZA 18 MG/3 ML INJECT P: 18 | 30 days supply | Qty: 9 | Fill #0

## 2017-07-04 NOTE — Telephone Encounter (Signed)
Requesting needles for insulin  [facility] in [place].

## 2017-07-10 ENCOUNTER — Encounter: Payer: Self-pay | Admitting: "Endocrinology

## 2017-07-10 ENCOUNTER — Ambulatory Visit (INDEPENDENT_AMBULATORY_CARE_PROVIDER_SITE_OTHER): Payer: BLUE CROSS/BLUE SHIELD | Admitting: "Endocrinology

## 2017-07-10 VITALS — BP 113/74 | HR 78 | Ht 70.0 in | Wt 213.0 lb

## 2017-07-10 DIAGNOSIS — Z794 Long term (current) use of insulin: Secondary | ICD-10-CM | POA: Diagnosis not present

## 2017-07-10 DIAGNOSIS — E782 Mixed hyperlipidemia: Secondary | ICD-10-CM | POA: Diagnosis not present

## 2017-07-10 DIAGNOSIS — I10 Essential (primary) hypertension: Secondary | ICD-10-CM | POA: Diagnosis not present

## 2017-07-10 DIAGNOSIS — N182 Chronic kidney disease, stage 2 (mild): Secondary | ICD-10-CM | POA: Diagnosis not present

## 2017-07-10 DIAGNOSIS — E1122 Type 2 diabetes mellitus with diabetic chronic kidney disease: Secondary | ICD-10-CM

## 2017-07-10 NOTE — Patient Instructions (Signed)

## 2017-07-10 NOTE — Progress Notes (Signed)
Subjective:    Patient ID: Travis Herrera, male    DOB: 1956-03-29,    Past Medical History:  Diagnosis Date  . Diabetes mellitus without complication ([place]) 3664   insulin started in 2012  . Hyperlipemia 2013  . Hypertension 2010   Past Surgical History:  Procedure Laterality Date  . CATARACT EXTRACTION W/PHACO  07/29/2012   Procedure: CATARACT EXTRACTION PHACO AND INTRAOCULAR LENS PLACEMENT (IOC);  Surgeon: Tonny Branch, MD;  Location: [facility];  Service: Ophthalmology;  Laterality: Left;  CDE=1.66  . CHOLECYSTECTOMY  2007   [facility]  . EYE SURGERY Left 2013   cataract   Social History   Social History  . Marital status: Married    Spouse name: N/A  . Number of children: N/A  . Years of education: N/A   Social History Main Topics  . Smoking status: Never Smoker  . Smokeless tobacco: Never Used  . Alcohol use No  . Drug use: No  . Sexual activity: Not Asked   Other Topics Concern  . None   Social History Narrative  . None   Outpatient Encounter Prescriptions as of 07/10/2017  Medication Sig  . amLODipine (NORVASC) 10 MG tablet TAKE 1 TABLET BY MOUTH ONCE DAILY  . aspirin EC 81 MG tablet Take 81 mg by mouth daily.  . benazepril (LOTENSIN) 20 MG tablet TAKE 1 TABLET BY MOUTH ONCE DAILY  . glucose blood (BAYER CONTOUR NEXT TEST) test strip Use as instructed 4 x daily  . hydrochlorothiazide (HYDRODIURIL) 25 MG tablet TAKE 1 TABLET BY MOUTH DAILY.  Marland Kitchen Insulin Pen Needle 31G X 8 MM MISC 1 each by Does not apply route 2 (two) times daily.  . Lancets (FREESTYLE) lancets Use as instructed bid  . LANTUS SOLOSTAR 100 UNIT/ML Solostar Pen INJECT 40 UNITS INTO THE SKIN EVERY MORNING.  Marland Kitchen liraglutide (VICTOZA) 18 MG/3ML SOPN Inject 0.3 mLs (1.8 mg total) into the skin daily.  Marland Kitchen lovastatin (MEVACOR) 20 MG tablet TAKE 1 TABLET BY MOUTH AT BEDTIME  . Omega-3 Fatty Acids (FISH OIL) 1000 MG CAPS Take 1,000 mg by mouth daily.   . sildenafil (VIAGRA) 100 MG tablet Take 1 tablet  (100 mg total) by mouth daily as needed for erectile dysfunction.  . triamterene-hydrochlorothiazide (MAXZIDE-25) 37.5-25 MG tablet TAKE 1 TABLET BY MOUTH ONCE DAILY  . [DISCONTINUED] Insulin Glargine (LANTUS SOLOSTAR) 100 UNIT/ML Solostar Pen INJECT 40 UNITS INTO THE SKIN DAILY with breakfast   No facility-administered encounter medications on file as of 07/10/2017.    ALLERGIES: No Known Allergies VACCINATION STATUS: Immunization History  Administered Date(s) Administered  . Influenza Split 07/17/2014  . Influenza,inj,Quad PF,6+ Mos 08/16/2015, 07/26/2016    Diabetes  He presents for his follow-up diabetic visit. He has type 2 diabetes mellitus. Onset time: He was diagnosed at approximate age of 10 years. His disease course has been stable. There are no hypoglycemic associated symptoms. Pertinent negatives for hypoglycemia include no confusion, headaches, pallor or seizures. There are no diabetic associated symptoms. Pertinent negatives for diabetes include no chest pain, no fatigue, no polydipsia, no polyphagia, no polyuria and no weakness. There are no hypoglycemic complications. Symptoms are stable. There are no diabetic complications. Risk factors for coronary artery disease include diabetes mellitus, dyslipidemia, hypertension, family history and male sex. Current diabetic treatment includes insulin injections and oral agent (monotherapy). He is compliant with treatment most of the time. His weight is increasing steadily. He is following a generally unhealthy diet. He participates in  exercise intermittently. There is no change in his home blood glucose trend. His breakfast blood glucose range is generally 130-140 mg/dl. His dinner blood glucose range is generally 130-140 mg/dl. His overall blood glucose range is 130-140 mg/dl. An ACE inhibitor/angiotensin II receptor blocker is being taken. Eye exam is current.  Hypertension  This is a chronic problem. The current episode started more than 1  year ago. The problem has been gradually improving since onset. Pertinent negatives include no chest pain, headaches, neck pain, palpitations or shortness of breath. Past treatments include ACE inhibitors. There are no compliance problems.   Hyperlipidemia  This is a chronic problem. The current episode started more than 1 year ago. Pertinent negatives include no chest pain, myalgias or shortness of breath. Current antihyperlipidemic treatment includes statins.     Review of Systems  Constitutional: Negative for fatigue and unexpected weight change.  HENT: Negative for dental problem, mouth sores and trouble swallowing.   Eyes: Negative for visual disturbance.  Respiratory: Negative for cough, choking, chest tightness, shortness of breath and wheezing.   Cardiovascular: Negative for chest pain, palpitations and leg swelling.  Gastrointestinal: Negative for abdominal distention, abdominal pain, constipation, diarrhea, nausea and vomiting.  Endocrine: Negative for polydipsia, polyphagia and polyuria.  Genitourinary: Negative for dysuria, flank pain, hematuria and urgency.  Musculoskeletal: Negative for back pain, gait problem, myalgias and neck pain.  Skin: Negative for pallor, rash and wound.  Neurological: Negative for seizures, syncope, weakness, numbness and headaches.  Psychiatric/Behavioral: Negative.  Negative for confusion and dysphoric mood.    Objective:    BP 113/74   Pulse 78   Ht 5\' 10"  (1.778 m)   Wt 213 lb (96.6 kg)   BMI 30.56 kg/m   Wt Readings from Last 3 Encounters:  07/10/17 213 lb (96.6 kg)  03/20/17 212 lb (96.2 kg)  01/18/17 209 lb (94.8 kg)    Physical Exam  Constitutional: He is oriented to person, place, and time. He appears well-developed and well-nourished. He is cooperative. No distress.  HENT:  Head: Normocephalic and atraumatic.  Eyes: EOM are normal.  Neck: Normal range of motion. Neck supple. No tracheal deviation present. No thyromegaly present.   Cardiovascular: Normal rate, S1 normal, S2 normal and normal heart sounds.  Exam reveals no gallop.   No murmur heard. Pulses:      Dorsalis pedis pulses are 1+ on the right side, and 1+ on the left side.       Posterior tibial pulses are 1+ on the right side, and 1+ on the left side.  Pulmonary/Chest: Breath sounds normal. No respiratory distress. He has no wheezes.  Abdominal: Soft. Bowel sounds are normal. He exhibits no distension. There is no tenderness. There is no guarding and no CVA tenderness.  Musculoskeletal: He exhibits no edema.       Right shoulder: He exhibits no swelling and no deformity.  Neurological: He is alert and oriented to person, place, and time. He has normal strength and normal reflexes. No cranial nerve deficit or sensory deficit. Gait normal.  Skin: Skin is warm and dry. No rash noted. No cyanosis. Nails show no clubbing.  Psychiatric: He has a normal mood and affect. His speech is normal and behavior is normal. Judgment and thought content normal. Cognition and memory are normal.    Results for orders placed or performed in visit on 03/20/17  Renal function panel  Result Value Ref Range   Glucose, Bld 165 (H) 65 - 99 mg/dL  BUN 21 7 - 25 mg/dL   Creat 1.38 (H) 0.70 - 1.33 mg/dL   BUN/Creatinine Ratio 15 6 - 22 (calc)   Sodium 138 135 - 146 mmol/L   Potassium 4.4 3.5 - 5.3 mmol/L   Chloride 104 98 - 110 mmol/L   CO2 27 20 - 32 mmol/L   Calcium 9.4 8.6 - 10.3 mg/dL   Phosphorus 3.3 2.5 - 4.5 mg/dL   Albumin 4.4 3.6 - 5.1 g/dL  Hemoglobin A1c  Result Value Ref Range   Hgb A1c MFr Bld 7.8 (H) <5.7 % of total Hgb   Mean Plasma Glucose 177 (calc)   eAG (mmol/L) 9.8 (calc)   Diabetic Labs (most recent): Lab Results  Component Value Date   HGBA1C 7.8 (H) 07/03/2017   HGBA1C 7.7 (H) 03/14/2017   HGBA1C 7.4 (H) 10/24/2016   Lipid Panel     Component Value Date/Time   CHOL 124 01/16/2017 0940   TRIG 58 01/16/2017 0940   HDL 36 (L) 01/16/2017 0940    CHOLHDL 3.4 01/16/2017 0940   VLDL 12 01/16/2017 0940   LDLCALC 76 01/16/2017 0940      Assessment & Plan:   1. Uncontrolled diabetes mellitus type 2 without complications, unspecified long term insulin use status ([place]) Patient came with Controlled fasting glucose profile, and his repeat A1c is Stable at 7.8%.   Glucose logs and insulin administration records pertaining to this visit,  to be scanned into patient's records.  Recent labs reviewed, showing mild renal insufficiency. - Patient remains at a high risk for more acute and chronic complications of diabetes which include CAD, CVA, CKD, retinopathy, and neuropathy. These are all discussed in detail with the patient.  - I have re-counseled the patient on diet management by adopting a carbohydrate restricted / protein rich  Diet. - Patient is advised to stick to a routine mealtimes to eat 3 meals  a day and avoid unnecessary snacks ( to snack only to correct hypoglycemia).  -Suggestion is made for him to avoid simple carbohydrates  from his diet including Cakes, Sweet Desserts, Ice Cream, Soda (diet and regular), Sweet Tea, Candies, Chips, Cookies, Store Bought Juices, Alcohol in Excess of  1-2 drinks a day, Artificial Sweeteners, and "Sugar-free" Products. This will help patient to have stable blood glucose profile and potentially avoid unintended weight gain.  - I have approached patient with the following individualized plan to manage diabetes and patient agrees.   -   He will not need prandial insulin based on his readings postprandially.   - I will  continue Lantus  40 units every morning with breakfast instead of at bedtime . - Patient is warned not to take insulin without proper monitoring per orders.  -Patient is encouraged to call clinic for blood glucose levels less than 70 or above 300 mg /dl. - I will continue  VICTOZA 1.8  mg subcutaneous daily, therapeutically suitable for patient. - His renal function is improving, he  will be reconsidered for low-dose metformin by next visit. He has used large dose of NSAIDs for arthritis prior to his last visit. He is advised to avoid both over-the-counter medications as well as to maintain adequate hydration.   - Patient specific target  for A1c; LDL, HDL, Triglycerides, and  Waist Circumference were discussed in detail.  2) BP/HTN: Controlled. Continue current medications including ACEI. 3) Lipids/HPL: Recent LDL at above target levels 76, continue statins. 4)  Weight/Diet: CDE consult in progress, exercise, and carbohydrates information provided.  5) Chronic Care/Health Maintenance:  -Patient  on ACEI and Statin medications and encouraged to continue to follow up with Ophthalmology, Podiatrist at least yearly or according to recommendations, and advised to stay away from smoking. I have recommended yearly flu vaccine and pneumonia vaccination at least every 5 years; moderate intensity exercise for up to 150 minutes weekly; and  sleep for at least 7 hours a day.  I advised patient to maintain close follow up with his PCP for primary care needs.  - Time spent with the patient: 25 min, of which >50% was spent in reviewing his sugar logs , discussing his hypo- and hyper-glycemic episodes, reviewing his current and  previous labs and insulin doses and developing a plan to avoid hypo- and hyper-glycemia.     Follow up plan: Return in about 3 months (around 10/09/2017) for follow up with pre-visit labs, meter, and logs.  Glade Lloyd, MD Phone: [phone]  Fax: [phone]  This note was partially dictated with voice recognition software. Similar sounding words can be transcribed inadequately or may not  be corrected upon review.  07/10/2017, 9:33 AM

## 2017-08-02 ENCOUNTER — Encounter: Payer: Self-pay | Admitting: Family Medicine

## 2017-08-02 ENCOUNTER — Ambulatory Visit (INDEPENDENT_AMBULATORY_CARE_PROVIDER_SITE_OTHER): Payer: BLUE CROSS/BLUE SHIELD | Admitting: Family Medicine

## 2017-08-02 VITALS — BP 114/70 | HR 77 | Temp 98.7°F | Resp 16 | Ht 70.0 in | Wt 215.2 lb

## 2017-08-02 DIAGNOSIS — N182 Chronic kidney disease, stage 2 (mild): Secondary | ICD-10-CM

## 2017-08-02 DIAGNOSIS — Z23 Encounter for immunization: Secondary | ICD-10-CM

## 2017-08-02 DIAGNOSIS — N528 Other male erectile dysfunction: Secondary | ICD-10-CM

## 2017-08-02 DIAGNOSIS — E663 Overweight: Secondary | ICD-10-CM | POA: Diagnosis not present

## 2017-08-02 DIAGNOSIS — E782 Mixed hyperlipidemia: Secondary | ICD-10-CM

## 2017-08-02 DIAGNOSIS — I10 Essential (primary) hypertension: Secondary | ICD-10-CM | POA: Diagnosis not present

## 2017-08-02 DIAGNOSIS — E1122 Type 2 diabetes mellitus with diabetic chronic kidney disease: Secondary | ICD-10-CM | POA: Diagnosis not present

## 2017-08-02 DIAGNOSIS — Z794 Long term (current) use of insulin: Secondary | ICD-10-CM | POA: Diagnosis not present

## 2017-08-02 NOTE — Patient Instructions (Addendum)
Annual physical exam April 3 or after, call if you need me before   Flu vaccine today.   Lipid and hepatic today   It is important that you exercise regularly at least 30 minutes 5 times a week. If you develop chest pain, have severe difficulty breathing, or feel very tired, stop exercising immediately and seek medical attention   Please work on good  health habits so that your health will improve. 1. Commitment to daily physical activity for 30 to 60  minutes, if you are able to do this.  2. Commitment to wise food choices. Aim for half of your  food intake to be vegetable and fruit, one quarter starchy foods, and one quarter protein. Try to eat on a regular schedule  3 meals per day, snacking between meals should be limited to vegetables or fruits or small portions of nuts. 64 ounces of water per day is generally recommended, unless you have specific health conditions, like heart failure or kidney failure where you will need to limit fluid intake.  3. Commitment to sufficient and a  good quality of physical and mental rest daily, generally between 6 to 8 hours per day.  WITH PERSISTANCE AND PERSEVERANCE, THE IMPOSSIBLE , BECOMES THE NORM!

## 2017-08-05 NOTE — Assessment & Plan Note (Signed)
Deteriorated. Patient re-educated about  the importance of commitment to a  minimum of 150 minutes of exercise per week.  The importance of healthy food choices with portion control discussed. Encouraged to start a food diary, count calories and to consider  joining a support group. Sample diet sheets offered. Goals set by the patient for the next several months.   Weight /BMI 08/02/2017 07/10/2017 03/20/2017  WEIGHT 215 lb 4 oz 213 lb 212 lb  HEIGHT 5\' 10"  5\' 10"  5\' 10"   BMI 30.89 kg/m2 30.56 kg/m2 30.42 kg/m2

## 2017-08-05 NOTE — Assessment & Plan Note (Signed)
Controlled, no change in medication  

## 2017-08-05 NOTE — Progress Notes (Signed)
Travis Herrera     MRN: [id number]      DOB: [birth year]   HPI Travis Herrera is here for follow up and re-evaluation of chronic medical conditions, medication management and review of any available recent lab and radiology data.  Preventive health is updated, specifically  Cancer screening and Immunization.   Questions or concerns regarding consultations or procedures which the PT has had in the interim are  addressed. The PT denies any adverse reactions to current medications since the last visit. Blood sugar currently uncontrolled,  but is improving, he is committed to regular exercise and healthy diet. There are no new concerns.  There are no specific complaints  Denies polyuria, polydipsia, blurred vision , or hypoglycemic episodes.  ROS Denies recent fever or chills. Denies sinus pressure, nasal congestion, ear pain or sore throat. Denies chest congestion, productive cough or wheezing. Denies chest pains, palpitations and leg swelling Denies abdominal pain, nausea, vomiting,diarrhea or constipation.   Denies dysuria, frequency, hesitancy or incontinence. Denies joint pain, swelling and limitation in mobility. Denies headaches, seizures, numbness, or tingling. Denies depression, anxiety or insomnia. Denies skin break down or rash. Denies polyuria, polydipsia, blurred vision , or hypoglycemic episodes.   PE  BP 114/70 (BP Location: Left Arm, Patient Position: Sitting, Cuff Size: Normal)   Pulse 77   Temp 98.7 F (37.1 C) (Other (Comment))   Resp 16   Ht 5\' 10"  (1.778 m)   Wt 215 lb 4 oz (97.6 kg)   SpO2 99%   BMI 30.89 kg/m   Patient alert and oriented and in no cardiopulmonary distress.  HEENT: No facial asymmetry, EOMI,   oropharynx pink and moist.  Neck supple no JVD, no mass.  Chest: Clear to auscultation bilaterally.  CVS: S1, S2 no murmurs, no S3.Regular rate.  ABD: Soft non tender.   Ext: No edema  MS: Adequate ROM spine, shoulders, hips and  knees.  Skin: Intact, no ulcerations or rash noted.  Psych: Good eye contact, normal affect. Memory intact not anxious or depressed appearing.  CNS: CN 2-12 intact, power,  normal throughout.no focal deficits noted.   Assessment & Plan  Benign hypertension Controlled, no change in medication DASH diet and commitment to daily physical activity for a minimum of 30 minutes discussed and encouraged, as a part of hypertension management. The importance of attaining a healthy weight is also discussed.  BP/Weight 08/02/2017 07/10/2017 03/20/2017 01/18/2017 11/14/2016 07/28/2016 08/67/6195  Systolic BP 093 267 124 580 998 338 250  Diastolic BP 70 74 73 78 75 69 74  Wt. (Lbs) 215.25 213 212 209 210 204 203  BMI 30.89 30.56 30.42 29.99 30.13 29.27 29.13       Mixed hyperlipidemia Updated lab needed at/ before next visit. Hyperlipidemia:Low fat diet discussed and encouraged.   Lipid Panel  Lab Results  Component Value Date   CHOL 124 01/16/2017   HDL 36 (L) 01/16/2017   LDLCALC 76 01/16/2017   TRIG 58 01/16/2017   CHOLHDL 3.4 01/16/2017       Erectile dysfunction Controlled, no change in medication   Overweight (BMI 25.0-29.9) Deteriorated. Patient re-educated about  the importance of commitment to a  minimum of 150 minutes of exercise per week.  The importance of healthy food choices with portion control discussed. Encouraged to start a food diary, count calories and to consider  joining a support group. Sample diet sheets offered. Goals set by the patient for the next several months.   Weight /BMI 08/02/2017  07/10/2017 03/20/2017  WEIGHT 215 lb 4 oz 213 lb 212 lb  HEIGHT 5\' 10"  5\' 10"  5\' 10"   BMI 30.89 kg/m2 30.56 kg/m2 30.42 kg/m2      Type 2 diabetes mellitus with stage 2 chronic kidney disease, with long-term current use of insulin ([place]) Uncontrolled but improving, managed by [name] is reminded of the importance of commitment to daily physical activity for  30 minutes or more, as able and the need to limit carbohydrate intake to 30 to 60 grams per meal to help with blood sugar control.   The need to take medication as prescribed, test blood sugar as directed, and to call between visits if there is a concern that blood sugar is uncontrolled is also discussed.   Travis Herrera is reminded of the importance of daily foot exam, annual eye examination, and good blood sugar, blood pressure and cholesterol control.  Diabetic Labs Latest Ref Rng & Units 07/03/2017 03/14/2017 01/16/2017 10/24/2016 07/18/2016  HbA1c <5.7 % of total Hgb 7.8(H) 7.7(H) - 7.4(H) 8.3(H)  Microalbumin Not estab mg/dL - - - 1.5 -  Micro/Creat Ratio <30 mcg/mg creat - - - 11 -  Chol <200 mg/dL - - 124 - -  HDL >40 mg/dL - - 36(L) - -  Calc LDL <100 mg/dL - - 76 - -  Triglycerides <150 mg/dL - - 58 - -  Creatinine 0.70 - 1.33 mg/dL 1.38(H) 1.44(H) - 1.12 1.21   BP/Weight 08/02/2017 07/10/2017 03/20/2017 01/18/2017 11/14/2016 07/28/2016 38/93/7342  Systolic BP 876 811 572 620 355 974 163  Diastolic BP 70 74 73 78 75 69 74  Wt. (Lbs) 215.25 213 212 209 210 204 203  BMI 30.89 30.56 30.42 29.99 30.13 29.27 29.13   Foot/eye exam completion dates Latest Ref Rng & Units 01/18/2017 07/10/2016  Eye Exam No Retinopathy - No Retinopathy  Foot Form Completion - Done -

## 2017-08-05 NOTE — Assessment & Plan Note (Signed)
Controlled, no change in medication DASH diet and commitment to daily physical activity for a minimum of 30 minutes discussed and encouraged, as a part of hypertension management. The importance of attaining a healthy weight is also discussed.  BP/Weight 08/02/2017 07/10/2017 03/20/2017 01/18/2017 11/14/2016 07/28/2016 51/89/8421  Systolic BP 031 281 188 677 373 668 159  Diastolic BP 70 74 73 78 75 69 74  Wt. (Lbs) 215.25 213 212 209 210 204 203  BMI 30.89 30.56 30.42 29.99 30.13 29.27 29.13

## 2017-08-05 NOTE — Assessment & Plan Note (Signed)
Updated lab needed at/ before next visit. Hyperlipidemia:Low fat diet discussed and encouraged.   Lipid Panel  Lab Results  Component Value Date   CHOL 124 01/16/2017   HDL 36 (L) 01/16/2017   LDLCALC 76 01/16/2017   TRIG 58 01/16/2017   CHOLHDL 3.4 01/16/2017

## 2017-08-05 NOTE — Assessment & Plan Note (Signed)
Uncontrolled but improving, managed by [name] is reminded of the importance of commitment to daily physical activity for 30 minutes or more, as able and the need to limit carbohydrate intake to 30 to 60 grams per meal to help with blood sugar control.   The need to take medication as prescribed, test blood sugar as directed, and to call between visits if there is a concern that blood sugar is uncontrolled is also discussed.   Travis Herrera is reminded of the importance of daily foot exam, annual eye examination, and good blood sugar, blood pressure and cholesterol control.  Diabetic Labs Latest Ref Rng & Units 07/03/2017 03/14/2017 01/16/2017 10/24/2016 07/18/2016  HbA1c <5.7 % of total Hgb 7.8(H) 7.7(H) - 7.4(H) 8.3(H)  Microalbumin Not estab mg/dL - - - 1.5 -  Micro/Creat Ratio <30 mcg/mg creat - - - 11 -  Chol <200 mg/dL - - 124 - -  HDL >40 mg/dL - - 36(L) - -  Calc LDL <100 mg/dL - - 76 - -  Triglycerides <150 mg/dL - - 58 - -  Creatinine 0.70 - 1.33 mg/dL 1.38(H) 1.44(H) - 1.12 1.21   BP/Weight 08/02/2017 07/10/2017 03/20/2017 01/18/2017 11/14/2016 07/28/2016 14/97/0263  Systolic BP 785 885 027 741 287 867 672  Diastolic BP 70 74 73 78 75 69 74  Wt. (Lbs) 215.25 213 212 209 210 204 203  BMI 30.89 30.56 30.42 29.99 30.13 29.27 29.13   Foot/eye exam completion dates Latest Ref Rng & Units 01/18/2017 07/10/2016  Eye Exam No Retinopathy - No Retinopathy  Foot Form Completion - Done -

## 2017-08-06 MED FILL — LANTUS SOLOSTAR 100 UNITS/M: 100 | 38 days supply | Qty: 15 | Fill #2

## 2017-08-06 MED FILL — VICTOZA 18 MG/3 ML INJECT P: 18 | 30 days supply | Qty: 9 | Fill #1

## 2017-08-30 ENCOUNTER — Other Ambulatory Visit: Payer: Self-pay | Admitting: "Endocrinology

## 2017-08-30 MED FILL — VICTOZA 18 MG/3 ML INJECT P: 18 | 30 days supply | Qty: 9 | Fill #2

## 2017-09-04 MED FILL — LANTUS SOLOSTAR 100 UNITS/M: 100 | 38 days supply | Qty: 15 | Fill #0

## 2017-09-26 ENCOUNTER — Other Ambulatory Visit: Payer: Self-pay | Admitting: Family Medicine

## 2017-09-27 ENCOUNTER — Other Ambulatory Visit: Payer: Self-pay | Admitting: "Endocrinology

## 2017-09-28 ENCOUNTER — Telehealth: Payer: Self-pay | Admitting: "Endocrinology

## 2017-09-28 MED FILL — VICTOZA 18 MG/3 ML INJECT P: 18 | 30 days supply | Qty: 9 | Fill #0

## 2017-09-28 NOTE — Telephone Encounter (Signed)
Rx sent 

## 2017-09-28 NOTE — Telephone Encounter (Signed)
Travis Herrera is asking if we could go ahead and refill his liraglutide (VICTOZA) 18 MG/3ML SOPN to [place] at [facility] he is wanting to go pick up today, please advise?

## 2017-10-02 ENCOUNTER — Telehealth: Payer: Self-pay

## 2017-10-02 NOTE — Telephone Encounter (Signed)
Per dr Moshe Cipro- ok to schedule wife Izora Gala for a new pt appt in the next available 40 min slot

## 2017-10-05 MED FILL — LANTUS SOLOSTAR 100 UNITS/M: 100 | 38 days supply | Qty: 15 | Fill #1

## 2017-10-10 ENCOUNTER — Ambulatory Visit: Payer: BLUE CROSS/BLUE SHIELD | Admitting: "Endocrinology

## 2017-10-24 DIAGNOSIS — Z794 Long term (current) use of insulin: Secondary | ICD-10-CM | POA: Diagnosis not present

## 2017-10-24 DIAGNOSIS — E1122 Type 2 diabetes mellitus with diabetic chronic kidney disease: Secondary | ICD-10-CM | POA: Diagnosis not present

## 2017-10-24 DIAGNOSIS — N182 Chronic kidney disease, stage 2 (mild): Secondary | ICD-10-CM | POA: Diagnosis not present

## 2017-10-24 DIAGNOSIS — E782 Mixed hyperlipidemia: Secondary | ICD-10-CM | POA: Diagnosis not present

## 2017-10-24 LAB — LIPID PANEL
CHOLESTEROL: 131 mg/dL (ref <200)
HDL: 33 mg/dL — ABNORMAL LOW (ref >40)
LDL CHOLESTEROL (CALC): 82 mg/dL
Non-HDL Cholesterol (Calc): 98 mg/dL (calc) (ref <130)
TRIGLYCERIDES: 77 mg/dL (ref <150)
Total CHOL/HDL Ratio: 4 (calc) (ref <5.0)

## 2017-10-25 LAB — COMPLETE METABOLIC PANEL WITH GFR
AG Ratio: 1.6 (calc) (ref 1.0–2.5)
ALBUMIN MSPROF: 4.1 g/dL (ref 3.6–5.1)
ALT: 11 U/L (ref 9–46)
AST: 14 U/L (ref 10–35)
Alkaline phosphatase (APISO): 76 U/L (ref 40–115)
BILIRUBIN TOTAL: 0.3 mg/dL (ref 0.2–1.2)
BUN/Creatinine Ratio: 19 (calc) (ref 6–22)
BUN: 25 mg/dL (ref 7–25)
CHLORIDE: 104 mmol/L (ref 98–110)
CO2: 29 mmol/L (ref 20–32)
CREATININE: 1.35 mg/dL — AB (ref 0.70–1.33)
Calcium: 9.2 mg/dL (ref 8.6–10.3)
GFR, EST AFRICAN AMERICAN: 66 mL/min/{1.73_m2} (ref >=60)
GFR, Est Non African American: 57 mL/min/{1.73_m2} — ABNORMAL LOW (ref >=60)
GLUCOSE: 134 mg/dL — AB (ref 65–99)
Globulin: 2.6 g/dL (calc) (ref 1.9–3.7)
Potassium: 4.6 mmol/L (ref 3.5–5.3)
Sodium: 140 mmol/L (ref 135–146)
TOTAL PROTEIN: 6.7 g/dL (ref 6.1–8.1)

## 2017-10-25 LAB — HEMOGLOBIN A1C
EAG (MMOL/L): 8.5 (calc)
Hgb A1c MFr Bld: 7 % of total Hgb — ABNORMAL HIGH (ref <5.7)
Mean Plasma Glucose: 154 (calc)

## 2017-10-29 MED FILL — VICTOZA 18 MG/3 ML INJECT P: 18 | 30 days supply | Qty: 9 | Fill #1

## 2017-10-30 ENCOUNTER — Encounter: Payer: Self-pay | Admitting: Family Medicine

## 2017-11-02 ENCOUNTER — Ambulatory Visit (INDEPENDENT_AMBULATORY_CARE_PROVIDER_SITE_OTHER): Payer: PPO | Admitting: "Endocrinology

## 2017-11-02 ENCOUNTER — Encounter: Payer: Self-pay | Admitting: "Endocrinology

## 2017-11-02 VITALS — BP 122/72 | HR 72 | Ht 70.0 in | Wt 211.0 lb

## 2017-11-02 DIAGNOSIS — N182 Chronic kidney disease, stage 2 (mild): Secondary | ICD-10-CM

## 2017-11-02 DIAGNOSIS — Z794 Long term (current) use of insulin: Secondary | ICD-10-CM | POA: Diagnosis not present

## 2017-11-02 DIAGNOSIS — E1122 Type 2 diabetes mellitus with diabetic chronic kidney disease: Secondary | ICD-10-CM | POA: Diagnosis not present

## 2017-11-02 DIAGNOSIS — E782 Mixed hyperlipidemia: Secondary | ICD-10-CM

## 2017-11-02 DIAGNOSIS — I10 Essential (primary) hypertension: Secondary | ICD-10-CM

## 2017-11-02 MED ORDER — INSULIN GLARGINE 100 UNIT/ML SOLOSTAR PEN: 50.0000 [IU] | PEN_INJECTOR | SUBCUTANEOUS | 2 refills | Status: DC

## 2017-11-02 MED ORDER — LIRAGLUTIDE 18 MG/3ML ~~LOC~~ SOPN: PEN_INJECTOR | SUBCUTANEOUS | 2 refills | Status: DC

## 2017-11-02 NOTE — Progress Notes (Signed)
Subjective:    Patient ID: Travis Herrera, male    DOB: 08-28-1956,    Past Medical History:  Diagnosis Date  . Diabetes mellitus without complication ([place]) 1478   insulin started in 2012  . Hyperlipemia 2013  . Hypertension 2010   Past Surgical History:  Procedure Laterality Date  . CATARACT EXTRACTION W/PHACO  07/29/2012   Procedure: CATARACT EXTRACTION PHACO AND INTRAOCULAR LENS PLACEMENT (IOC);  Surgeon: Tonny Branch, MD;  Location: [facility];  Service: Ophthalmology;  Laterality: Left;  CDE=1.66  . CHOLECYSTECTOMY  2007   [facility]  . EYE SURGERY Left 2013   cataract   Social History   Socioeconomic History  . Marital status: Married    Spouse name: None  . Number of children: None  . Years of education: None  . Highest education level: None  Social Needs  . Financial resource strain: None  . Food insecurity - worry: None  . Food insecurity - inability: None  . Transportation needs - medical: None  . Transportation needs - non-medical: None  Occupational History  . None  Tobacco Use  . Smoking status: Never Smoker  . Smokeless tobacco: Never Used  Substance and Sexual Activity  . Alcohol use: No  . Drug use: No  . Sexual activity: None  Other Topics Concern  . None  Social History Narrative  . None   Outpatient Encounter Medications as of 11/02/2017  Medication Sig  . amLODipine (NORVASC) 10 MG tablet TAKE 1 TABLET BY MOUTH ONCE DAILY  . aspirin EC 81 MG tablet Take 81 mg by mouth daily.  . benazepril (LOTENSIN) 20 MG tablet TAKE 1 TABLET BY MOUTH ONCE DAILY  . glucose blood (BAYER CONTOUR NEXT TEST) test strip Use as instructed 4 x daily  . hydrochlorothiazide (HYDRODIURIL) 25 MG tablet TAKE 1 TABLET BY MOUTH DAILY.  Marland Kitchen Insulin Glargine (LANTUS SOLOSTAR) 100 UNIT/ML Solostar Pen Inject 50 Units into the skin every morning.  . Insulin Pen Needle 31G X 8 MM MISC 1 each by Does not apply route 2 (two) times daily.  . Lancets (FREESTYLE) lancets Use as  instructed bid  . liraglutide (VICTOZA) 18 MG/3ML SOPN INJECT 1.8 MG INTO THE SKIN DAILY AS DIRECTED  . lovastatin (MEVACOR) 20 MG tablet TAKE 1 TABLET BY MOUTH AT BEDTIME  . Omega-3 Fatty Acids (FISH OIL) 1000 MG CAPS Take 1,000 mg by mouth daily.   . sildenafil (VIAGRA) 100 MG tablet Take 1 tablet (100 mg total) by mouth daily as needed for erectile dysfunction.  . triamterene-hydrochlorothiazide (MAXZIDE-25) 37.5-25 MG tablet TAKE 1 TABLET BY MOUTH ONCE DAILY  . [DISCONTINUED] LANTUS SOLOSTAR 100 UNIT/ML Solostar Pen INJECT 40 UNITS INTO THE SKIN EVERY MORNING. (Patient taking differently: Inject 50 Units into the skin every morning. )  . [DISCONTINUED] VICTOZA 18 MG/3ML SOPN INJECT 1.8 MG INTO THE SKIN DAILY AS DIRECTED   No facility-administered encounter medications on file as of 11/02/2017.    ALLERGIES: No Known Allergies VACCINATION STATUS: Immunization History  Administered Date(s) Administered  . Influenza Split 07/17/2014  . Influenza,inj,Quad PF,6+ Mos 08/16/2015, 07/26/2016, 08/02/2017    Diabetes  He presents for his follow-up diabetic visit. He has type 2 diabetes mellitus. Onset time: He was diagnosed at approximate age of 58 years. His disease course has been worsening. There are no hypoglycemic associated symptoms. Pertinent negatives for hypoglycemia include no confusion, headaches, pallor or seizures. There are no diabetic associated symptoms. Pertinent negatives for diabetes include no chest  pain, no fatigue, no polydipsia, no polyphagia, no polyuria and no weakness. There are no hypoglycemic complications. Symptoms are improving. Diabetic complications include nephropathy. Risk factors for coronary artery disease include diabetes mellitus, dyslipidemia, hypertension, family history and male sex. Current diabetic treatment includes insulin injections and oral agent (monotherapy). He is compliant with treatment most of the time. His weight is decreasing steadily. He is  following a generally unhealthy diet. He participates in exercise three times a week. His home blood glucose trend is decreasing steadily. His breakfast blood glucose range is generally 140-180 mg/dl. His dinner blood glucose range is generally 140-180 mg/dl. His overall blood glucose range is 140-180 mg/dl. An ACE inhibitor/angiotensin II receptor blocker is being taken. Eye exam is current.  Hypertension  This is a chronic problem. The current episode started more than 1 year ago. The problem has been gradually improving since onset. The problem is controlled. Pertinent negatives include no chest pain, headaches, neck pain, palpitations or shortness of breath. Risk factors for coronary artery disease include dyslipidemia, diabetes mellitus and male gender. Past treatments include ACE inhibitors. There are no compliance problems.   Hyperlipidemia  This is a chronic problem. The current episode started more than 1 year ago. Exacerbating diseases include diabetes. Pertinent negatives include no chest pain, myalgias or shortness of breath. Current antihyperlipidemic treatment includes statins. Risk factors for coronary artery disease include dyslipidemia, diabetes mellitus, hypertension and male sex.    Review of Systems  Constitutional: Negative for fatigue and unexpected weight change.  HENT: Negative for dental problem, mouth sores and trouble swallowing.   Eyes: Negative for visual disturbance.  Respiratory: Negative for cough, choking, chest tightness, shortness of breath and wheezing.   Cardiovascular: Negative for chest pain, palpitations and leg swelling.  Gastrointestinal: Negative for abdominal distention, abdominal pain, constipation, diarrhea, nausea and vomiting.  Endocrine: Negative for polydipsia, polyphagia and polyuria.  Genitourinary: Negative for dysuria, flank pain, hematuria and urgency.  Musculoskeletal: Negative for back pain, gait problem, myalgias and neck pain.  Skin:  Negative for pallor, rash and wound.  Neurological: Negative for seizures, syncope, weakness, numbness and headaches.  Psychiatric/Behavioral: Negative.  Negative for confusion and dysphoric mood.    Objective:    BP 122/72   Pulse 72   Ht 5\' 10"  (1.778 m)   Wt 211 lb (95.7 kg)   BMI 30.28 kg/m   Wt Readings from Last 3 Encounters:  11/02/17 211 lb (95.7 kg)  08/02/17 215 lb 4 oz (97.6 kg)  07/10/17 213 lb (96.6 kg)    Physical Exam  Constitutional: He is oriented to person, place, and time. He appears well-developed and well-nourished. He is cooperative. No distress.  HENT:  Head: Normocephalic and atraumatic.  Eyes: EOM are normal.  Neck: Normal range of motion. Neck supple. No tracheal deviation present. No thyromegaly present.  Cardiovascular: Normal rate, S1 normal, S2 normal and normal heart sounds. Exam reveals no gallop.  No murmur heard. Pulses:      Dorsalis pedis pulses are 1+ on the right side, and 1+ on the left side.       Posterior tibial pulses are 1+ on the right side, and 1+ on the left side.  Pulmonary/Chest: Breath sounds normal. No respiratory distress. He has no wheezes.  Abdominal: Soft. Bowel sounds are normal. He exhibits no distension. There is no tenderness. There is no guarding and no CVA tenderness.  Musculoskeletal: He exhibits no edema.       Right shoulder: He exhibits no  swelling and no deformity.  Neurological: He is alert and oriented to person, place, and time. He has normal strength and normal reflexes. No cranial nerve deficit or sensory deficit. Gait normal.  Skin: Skin is warm and dry. No rash noted. No cyanosis. Nails show no clubbing.  Psychiatric: He has a normal mood and affect. His speech is normal and behavior is normal. Judgment and thought content normal. Cognition and memory are normal.    Results for orders placed or performed in visit on 08/02/17  Lipid panel  Result Value Ref Range   Cholesterol 131 <200 mg/dL   HDL 33 (L)  >40 mg/dL   Triglycerides 77 <150 mg/dL   LDL Cholesterol (Calc) 82 mg/dL (calc)   Total CHOL/HDL Ratio 4.0 <5.0 (calc)   Non-HDL Cholesterol (Calc) 98 <130 mg/dL (calc)   Diabetic Labs (most recent): Lab Results  Component Value Date   HGBA1C 7.0 (H) 10/24/2017   HGBA1C 7.8 (H) 07/03/2017   HGBA1C 7.7 (H) 03/14/2017   Lipid Panel     Component Value Date/Time   CHOL 131 10/24/2017 0806   TRIG 77 10/24/2017 0806   HDL 33 (L) 10/24/2017 0806   CHOLHDL 4.0 10/24/2017 0806   VLDL 12 01/16/2017 0940   LDLCALC 76 01/16/2017 0940      Assessment & Plan:   1. Uncontrolled diabetes mellitus type 2 without complications, unspecified long term insulin use status ([place]) Patient came with Controlled fasting and bedtime glucose profile, and his repeat A1c is  7%, improving from 7.8% last visit.     Glucose logs and insulin administration records pertaining to this visit,  to be scanned into patient's records.  Recent labs reviewed, showing mild renal insufficiency. - Patient remains at a high risk for more acute and chronic complications of diabetes which include CAD, CVA, CKD, retinopathy, and neuropathy. These are all discussed in detail with the patient.  - I have re-counseled the patient on diet management by adopting a carbohydrate restricted / protein rich  Diet. - Patient is advised to stick to a routine mealtimes to eat 3 meals  a day and avoid unnecessary snacks ( to snack only to correct hypoglycemia).  -  Suggestion is made for him to avoid simple carbohydrates  from his diet including Cakes, Sweet Desserts / Pastries, Ice Cream, Soda (diet and regular), Sweet Tea, Candies, Chips, Cookies, Store Bought Juices, Alcohol in Excess of  1-2 drinks a day, Artificial Sweeteners, and "Sugar-free" Products. This will help patient to have stable blood glucose profile and potentially avoid unintended weight gain.   - I have approached patient with the following individualized plan to manage  diabetes and patient agrees.   - Based on his current response to basal insulin,he  will not need prandial insulin for now.  - I will  continue Lantus  50 units every morning with breakfast instead of at bedtime , associated with strict monitoring of blood glucose 2 times a day-daily before breakfast and at bedtime. - Patient is warned not to take insulin without proper monitoring per orders. -Patient is encouraged to call clinic for blood glucose levels less than 70 or above 300 mg /dl. - I will continue  VICTOZA 1.8  mg subcutaneous daily, therapeutically suitable for patient. - His renal function is improving, he will be reconsidered for low-dose metformin by next visit. He has used large dose of NSAIDs for arthritis prior to his last visit. He is advised to avoid both over-the-counter medications as well as  to maintain adequate hydration.  - Patient specific target  for A1c; LDL, HDL, Triglycerides, and  Waist Circumference were discussed in detail.  2) BP/HTN: His blood pressure is controlled to target. I have advised him to continue his current blood pressure medications including  ACEI. 3) Lipids/HPL: Recent LDL at target levels 76, continue statins. 4)  Weight/Diet: CDE consult in progress, exercise, and carbohydrates information provided.  5) Chronic Care/Health Maintenance:  -Patient  on ACEI and Statin medications and encouraged to continue to follow up with Ophthalmology, Podiatrist at least yearly or according to recommendations, and advised to stay away from smoking. I have recommended yearly flu vaccine and pneumonia vaccination at least every 5 years; moderate intensity exercise for up to 150 minutes weekly; and  sleep for at least 7 hours a day.  I advised patient to maintain close follow up with his PCP for primary care needs.  - Time spent with the patient: 25 min, of which >50% was spent in reviewing his blood glucose logs , discussing his hypo- and hyper-glycemic episodes,  reviewing his current and  previous labs and insulin doses and developing a plan to avoid hypo- and hyper-glycemia. Please refer to Patient Instructions for Blood Glucose Monitoring and Insulin/Medications Dosing Guide"  in media tab for additional information.   Follow up plan: Return in about 4 months (around 03/02/2018) for meter, and logs.  Glade Lloyd, MD Phone: [phone]  Fax: [phone]  This note was partially dictated with voice recognition software. Similar sounding words can be transcribed inadequately or may not  be corrected upon review.  11/02/2017, 9:44 AM

## 2017-11-02 NOTE — Patient Instructions (Signed)

## 2018-01-20 ENCOUNTER — Other Ambulatory Visit: Payer: Self-pay | Admitting: Family Medicine

## 2018-01-22 ENCOUNTER — Encounter: Payer: BLUE CROSS/BLUE SHIELD | Admitting: Family Medicine

## 2018-01-29 ENCOUNTER — Other Ambulatory Visit: Payer: Self-pay | Admitting: "Endocrinology

## 2018-02-12 ENCOUNTER — Other Ambulatory Visit: Payer: Self-pay | Admitting: Family Medicine

## 2018-02-13 ENCOUNTER — Other Ambulatory Visit: Payer: Self-pay

## 2018-02-13 MED ORDER — GLUCOSE BLOOD VI STRP: ORAL_STRIP | 5 refills | Status: DC

## 2018-02-13 MED ORDER — ONETOUCH DELICA LANCETS 33G MISC: 3 refills | Status: AC

## 2018-02-13 MED ORDER — ONETOUCH VERIO W/DEVICE KIT: 1.0000 | PACK | Freq: Three times a day (TID) | 0 refills | Status: DC

## 2018-02-13 MED ORDER — GLUCOSE BLOOD VI STRP: ORAL_STRIP | 3 refills | Status: DC

## 2018-02-20 ENCOUNTER — Ambulatory Visit (INDEPENDENT_AMBULATORY_CARE_PROVIDER_SITE_OTHER): Payer: PPO | Admitting: Family Medicine

## 2018-02-20 ENCOUNTER — Encounter: Payer: Self-pay | Admitting: Family Medicine

## 2018-02-20 VITALS — BP 118/80 | HR 68 | Resp 16 | Ht 70.0 in | Wt 212.0 lb

## 2018-02-20 DIAGNOSIS — N182 Chronic kidney disease, stage 2 (mild): Secondary | ICD-10-CM

## 2018-02-20 DIAGNOSIS — Z794 Long term (current) use of insulin: Secondary | ICD-10-CM

## 2018-02-20 DIAGNOSIS — Z0000 Encounter for general adult medical examination without abnormal findings: Secondary | ICD-10-CM | POA: Diagnosis not present

## 2018-02-20 DIAGNOSIS — H60391 Other infective otitis externa, right ear: Secondary | ICD-10-CM

## 2018-02-20 DIAGNOSIS — Z125 Encounter for screening for malignant neoplasm of prostate: Secondary | ICD-10-CM

## 2018-02-20 DIAGNOSIS — E1122 Type 2 diabetes mellitus with diabetic chronic kidney disease: Secondary | ICD-10-CM

## 2018-02-20 DIAGNOSIS — H6091 Unspecified otitis externa, right ear: Secondary | ICD-10-CM | POA: Insufficient documentation

## 2018-02-20 DIAGNOSIS — E782 Mixed hyperlipidemia: Secondary | ICD-10-CM

## 2018-02-20 NOTE — Assessment & Plan Note (Signed)

## 2018-02-21 MED ORDER — CEPHALEXIN 500 MG PO CAPS: ORAL_CAPSULE | ORAL | 0 refills | Status: DC

## 2018-02-21 MED ORDER — HYDROCORTISONE-ACETIC ACID 1-2 % OT SOLN: OTIC | 0 refills | Status: DC

## 2018-02-21 NOTE — Patient Instructions (Addendum)
Wellness with nurse in 4 months  MD follow up in 5 months  Medications x 2 are sent for right ear  You do need  The pneumonia vaccines  Please work on good  health habits so that your health will improve. 1. Commitment to daily physical activity for 30 to 60  minutes, if you are able to do this.  2. Commitment to wise food choices. Aim for half of your  food intake to be vegetable and fruit, one quarter starchy foods, and one quarter protein. Try to eat on a regular schedule  3 meals per day, snacking between meals should be limited to vegetables or fruits or small portions of nuts. 64 ounces of water per day is generally recommended, unless you have specific health conditions, like heart failure or kidney failure where you will need to limit fluid intake.  3. Commitment to sufficient and a  good quality of physical and mental rest daily, generally between 6 to 8 hours per day.  WITH PERSISTANCE AND PERSEVERANCE, THE IMPOSSIBLE , BECOMES THE NORM! ,ex1 Thank you  for choosing [facility]. We consider it a privelige to serve you.  Delivering excellent health care in a caring and  compassionate way is our goal.  Partnering with you,  so that together we can achieve this goal is our strategy.

## 2018-02-22 NOTE — Progress Notes (Signed)
   Travis Herrera     MRN: [id number]      DOB: 1955-10-22   HPI: Patient is in for annual physical exam. D/o 1 month h/o itching and pain in right ear,no loss of hearing or drainage, denies putting foreign body in the ear Recent labs,  are reviewed. Immunization is reviewed , choosing to delay needed pneumonia vaccine  PE; BP 118/80   Pulse 68   Resp 16   Ht 5\' 10"  (1.778 m)   Wt 212 lb (96.2 kg)   SpO2 98%   BMI 30.42 kg/m   Pleasant male, alert and oriented x 3, in no cardio-pulmonary distress. Afebrile. HEENT No facial trauma or asymetry. Sinuses non tender. EOMI, pupils equally reactive to light. External ears normal, tympanic membranes clear.Right external ear anal erythematous and edematous, no drainage present Oropharynx moist, no exudate. Neck: supple, no adenopathy,JVD or thyromegaly.No bruits.  Chest: Clear to ascultation bilaterally.No crackles or wheezes. Non tender to palpation  Breast: No asymetry,no masses. No nipple discharge or inversion. No axillary or supraclavicular adenopathy  Cardiovascular system; Heart sounds normal,  S1 and  S2 ,no S3.  No murmur, or thrill. Apical beat not displaced Peripheral pulses normal.  Abdomen: Soft, non tender, no organomegaly or masses. No bruits. Bowel sounds normal. No guarding, tenderness or rebound.      Musculoskeletal exam: Full ROM of spine, hips , shoulders and reduced in left  Knee which is markedly deformed .  No muscle wasting or atrophy.   Neurologic: Cranial nerves 2 to 12 intact. Power, tone ,sensation and reflexes normal throughout. Abnormal gait. No tremor.  Skin: Intact, no ulceration, erythema , scaling or rash noted. Pigmentation normal throughout  Psych; Normal mood and affect. Judgement and concentration normal   Assessment & Plan:  Annual physical exam Annual exam as documented. Counseling done  re healthy lifestyle involving commitment to 150 minutes exercise per week,  heart healthy diet, and attaining healthy weight.The importance of adequate sleep also discussed. Regular seat belt use and home safety, is also discussed. Changes in health habits are decided on by the patient with goals and time frames  set for achieving them. Immunization and cancer screening needs are specifically addressed at this visit.   Otitis externa of right ear Antibiotic by mouth and steroid ear drop to affected ear prescribed

## 2018-02-22 NOTE — Assessment & Plan Note (Signed)
Antibiotic by mouth and steroid ear drop to affected ear prescribed

## 2018-02-26 DIAGNOSIS — Z794 Long term (current) use of insulin: Secondary | ICD-10-CM | POA: Diagnosis not present

## 2018-02-26 DIAGNOSIS — E1122 Type 2 diabetes mellitus with diabetic chronic kidney disease: Secondary | ICD-10-CM | POA: Diagnosis not present

## 2018-02-26 DIAGNOSIS — E782 Mixed hyperlipidemia: Secondary | ICD-10-CM | POA: Diagnosis not present

## 2018-02-26 DIAGNOSIS — N182 Chronic kidney disease, stage 2 (mild): Secondary | ICD-10-CM | POA: Diagnosis not present

## 2018-02-27 LAB — LIPID PANEL
Cholesterol: 131 mg/dL (ref <200)
HDL: 33 mg/dL — ABNORMAL LOW (ref >40)
LDL Cholesterol (Calc): 80 mg/dL (calc)
Non-HDL Cholesterol (Calc): 98 mg/dL (calc) (ref <130)
Total CHOL/HDL Ratio: 4 (calc) (ref <5.0)
Triglycerides: 96 mg/dL (ref <150)

## 2018-02-27 LAB — CBC
HCT: 36.1 % — ABNORMAL LOW (ref 38.5–50.0)
Hemoglobin: 12.4 g/dL — ABNORMAL LOW (ref 13.2–17.1)
MCH: 30.2 pg (ref 27.0–33.0)
MCHC: 34.3 g/dL (ref 32.0–36.0)
MCV: 88 fL (ref 80.0–100.0)
MPV: 10.5 fL (ref 7.5–12.5)
PLATELETS: 297 10*3/uL (ref 140–400)
RBC: 4.1 10*6/uL — ABNORMAL LOW (ref 4.20–5.80)
RDW: 13 % (ref 11.0–15.0)
WBC: 7.4 10*3/uL (ref 3.8–10.8)

## 2018-02-27 LAB — COMPLETE METABOLIC PANEL WITH GFR
AG Ratio: 1.5 (calc) (ref 1.0–2.5)
ALBUMIN MSPROF: 4.2 g/dL (ref 3.6–5.1)
ALT: 14 U/L (ref 9–46)
AST: 19 U/L (ref 10–35)
Alkaline phosphatase (APISO): 80 U/L (ref 40–115)
BUN/Creatinine Ratio: 17 (calc) (ref 6–22)
BUN: 25 mg/dL (ref 7–25)
CALCIUM: 9.2 mg/dL (ref 8.6–10.3)
CO2: 28 mmol/L (ref 20–32)
CREATININE: 1.48 mg/dL — AB (ref 0.70–1.33)
Chloride: 105 mmol/L (ref 98–110)
GFR, EST NON AFRICAN AMERICAN: 51 mL/min/{1.73_m2} — AB (ref >=60)
GFR, Est African American: 59 mL/min/{1.73_m2} — ABNORMAL LOW (ref >=60)
GLOBULIN: 2.8 g/dL (ref 1.9–3.7)
Glucose, Bld: 114 mg/dL — ABNORMAL HIGH (ref 65–99)
Potassium: 4.3 mmol/L (ref 3.5–5.3)
SODIUM: 139 mmol/L (ref 135–146)
Total Bilirubin: 0.4 mg/dL (ref 0.2–1.2)
Total Protein: 7 g/dL (ref 6.1–8.1)

## 2018-02-27 LAB — HEMOGLOBIN A1C
EAG (MMOL/L): 8.5 (calc)
Hgb A1c MFr Bld: 7 % of total Hgb — ABNORMAL HIGH (ref <5.7)
Mean Plasma Glucose: 154 (calc)

## 2018-02-27 LAB — PSA: PSA: 1.3 ng/mL (ref <=4.0)

## 2018-02-27 LAB — TSH: TSH: 1.95 mIU/L (ref 0.40–4.50)

## 2018-03-05 ENCOUNTER — Ambulatory Visit (INDEPENDENT_AMBULATORY_CARE_PROVIDER_SITE_OTHER): Payer: PPO | Admitting: "Endocrinology

## 2018-03-05 ENCOUNTER — Encounter: Payer: Self-pay | Admitting: "Endocrinology

## 2018-03-05 VITALS — BP 115/75 | HR 56 | Ht 70.0 in | Wt 209.0 lb

## 2018-03-05 DIAGNOSIS — Z794 Long term (current) use of insulin: Secondary | ICD-10-CM

## 2018-03-05 DIAGNOSIS — I10 Essential (primary) hypertension: Secondary | ICD-10-CM

## 2018-03-05 DIAGNOSIS — E1122 Type 2 diabetes mellitus with diabetic chronic kidney disease: Secondary | ICD-10-CM

## 2018-03-05 DIAGNOSIS — E782 Mixed hyperlipidemia: Secondary | ICD-10-CM

## 2018-03-05 DIAGNOSIS — N182 Chronic kidney disease, stage 2 (mild): Secondary | ICD-10-CM

## 2018-03-05 NOTE — Patient Instructions (Signed)

## 2018-03-05 NOTE — Progress Notes (Signed)
Subjective:    Patient ID: Travis Herrera, male    DOB: [birth year],    Past Medical History:  Diagnosis Date  . Diabetes mellitus without complication ([place]) 8828   insulin started in 2012  . Hyperlipemia 2013  . Hypertension 2010   Past Surgical History:  Procedure Laterality Date  . CATARACT EXTRACTION W/PHACO  07/29/2012   Procedure: CATARACT EXTRACTION PHACO AND INTRAOCULAR LENS PLACEMENT (IOC);  Surgeon: Tonny Branch, MD;  Location: [facility];  Service: Ophthalmology;  Laterality: Left;  CDE=1.66  . CHOLECYSTECTOMY  2007   [facility]  . EYE SURGERY Left 2013   cataract   Social History   Socioeconomic History  . Marital status: Married    Spouse name: Not on file  . Number of children: Not on file  . Years of education: Not on file  . Highest education level: Not on file  Occupational History  . Not on file  Social Needs  . Financial resource strain: Not on file  . Food insecurity:    Worry: Not on file    Inability: Not on file  . Transportation needs:    Medical: Not on file    Non-medical: Not on file  Tobacco Use  . Smoking status: Never Smoker  . Smokeless tobacco: Never Used  Substance and Sexual Activity  . Alcohol use: No  . Drug use: No  . Sexual activity: Not on file  Lifestyle  . Physical activity:    Days per week: Not on file    Minutes per session: Not on file  . Stress: Not on file  Relationships  . Social connections:    Talks on phone: Not on file    Gets together: Not on file    Attends religious service: Not on file    Active member of club or organization: Not on file    Attends meetings of clubs or organizations: Not on file    Relationship status: Not on file  Other Topics Concern  . Not on file  Social History Narrative  . Not on file   Outpatient Encounter Medications as of 03/05/2018  Medication Sig  . acetic acid-hydrocortisone (VOSOL-HC) OTIC solution Two drops to right ear 3 times daily for 5 days then as needed  .  amLODipine (NORVASC) 10 MG tablet TAKE 1 TABLET BY MOUTH ONCE DAILY  . aspirin EC 81 MG tablet Take 81 mg by mouth daily.  . benazepril (LOTENSIN) 20 MG tablet TAKE 1 TABLET BY MOUTH ONCE DAILY  . glucose blood (ONETOUCH VERIO) test strip One touch verio strips- three times daily testing  . hydrochlorothiazide (HYDRODIURIL) 25 MG tablet TAKE 1 TABLET BY MOUTH DAILY.  Marland Kitchen Insulin Pen Needle 31G X 8 MM MISC 1 each by Does not apply route 2 (two) times daily.  . Lancets (FREESTYLE) lancets Use as instructed bid  . LANTUS SOLOSTAR 100 UNIT/ML Solostar Pen INJECT 50 UNITS SUBCUTANEOUSLY ONCE DAILY IN THE MORNING  . liraglutide (VICTOZA) 18 MG/3ML SOPN INJECT 1.8 MG INTO THE SKIN DAILY AS DIRECTED  . lovastatin (MEVACOR) 20 MG tablet TAKE 1 TABLET BY MOUTH AT BEDTIME  . Omega-3 Fatty Acids (FISH OIL) 1000 MG CAPS Take 1,000 mg by mouth daily.   Glory Rosebush DELICA LANCETS 00L MISC Three times daily testing dx e11.65  . sildenafil (VIAGRA) 100 MG tablet Take 1 tablet (100 mg total) by mouth daily as needed for erectile dysfunction.  . triamterene-hydrochlorothiazide (MAXZIDE-25) 37.5-25 MG tablet TAKE 1 TABLET  BY MOUTH ONCE DAILY  . [DISCONTINUED] Blood Glucose Monitoring Suppl (ONETOUCH VERIO) w/Device KIT 1 Device by Does not apply route 3 (three) times daily. Three times daily testing  Dx e11.65  . [DISCONTINUED] cephALEXin (KEFLEX) 500 MG capsule One capsule three times daily for 1 week  . [DISCONTINUED] CONTOUR NEXT TEST test strip USE 1 STRIP TO CHECK GLUCOSE 4 TIMES DAILY AS DIRECTED   No facility-administered encounter medications on file as of 03/05/2018.    ALLERGIES: No Known Allergies VACCINATION STATUS: Immunization History  Administered Date(s) Administered  . Influenza Split 07/17/2014  . Influenza,inj,Quad PF,6+ Mos 08/16/2015, 07/26/2016, 08/02/2017    Diabetes  He presents for his follow-up diabetic visit. He has type 2 diabetes mellitus. Onset time: He was diagnosed at  approximate age of 64 years. His disease course has been stable. There are no hypoglycemic associated symptoms. Pertinent negatives for hypoglycemia include no confusion, headaches, pallor or seizures. There are no diabetic associated symptoms. Pertinent negatives for diabetes include no chest pain, no fatigue, no polydipsia, no polyphagia, no polyuria and no weakness. There are no hypoglycemic complications. Symptoms are stable. Diabetic complications include nephropathy. Risk factors for coronary artery disease include diabetes mellitus, dyslipidemia, hypertension, family history and male sex. Current diabetic treatment includes insulin injections and oral agent (monotherapy). He is compliant with treatment most of the time. His weight is decreasing steadily. He is following a generally unhealthy diet. He participates in exercise three times a week. His home blood glucose trend is decreasing steadily. His breakfast blood glucose range is generally 140-180 mg/dl. His dinner blood glucose range is generally 140-180 mg/dl. His overall blood glucose range is 140-180 mg/dl. An ACE inhibitor/angiotensin II receptor blocker is being taken. Eye exam is current.  Hypertension  This is a chronic problem. The current episode started more than 1 year ago. The problem has been gradually improving since onset. The problem is controlled. Pertinent negatives include no chest pain, headaches, neck pain, palpitations or shortness of breath. Risk factors for coronary artery disease include dyslipidemia, diabetes mellitus and male gender. Past treatments include ACE inhibitors. There are no compliance problems.   Hyperlipidemia  This is a chronic problem. The current episode started more than 1 year ago. Exacerbating diseases include diabetes. Pertinent negatives include no chest pain, myalgias or shortness of breath. Current antihyperlipidemic treatment includes statins. Risk factors for coronary artery disease include  dyslipidemia, diabetes mellitus, hypertension and male sex.    Review of Systems  Constitutional: Negative for fatigue and unexpected weight change.  HENT: Negative for dental problem, mouth sores and trouble swallowing.   Eyes: Negative for visual disturbance.  Respiratory: Negative for cough, choking, chest tightness, shortness of breath and wheezing.   Cardiovascular: Negative for chest pain, palpitations and leg swelling.  Gastrointestinal: Negative for abdominal distention, abdominal pain, constipation, diarrhea, nausea and vomiting.  Endocrine: Negative for polydipsia, polyphagia and polyuria.  Genitourinary: Negative for dysuria, flank pain, hematuria and urgency.  Musculoskeletal: Negative for back pain, gait problem, myalgias and neck pain.  Skin: Negative for pallor, rash and wound.  Neurological: Negative for seizures, syncope, weakness, numbness and headaches.  Psychiatric/Behavioral: Negative.  Negative for confusion and dysphoric mood.    Objective:    BP 115/75   Pulse (!) 56   Ht 5' 10"  (1.778 m)   Wt 209 lb (94.8 kg)   BMI 29.99 kg/m   Wt Readings from Last 3 Encounters:  03/05/18 209 lb (94.8 kg)  02/20/18 212 lb (96.2 kg)  11/02/17 211 lb (95.7 kg)    Physical Exam  Constitutional: He is oriented to person, place, and time. He appears well-developed and well-nourished. He is cooperative. No distress.  HENT:  Head: Normocephalic and atraumatic.  Eyes: EOM are normal.  Neck: Normal range of motion. Neck supple. No tracheal deviation present. No thyromegaly present.  Cardiovascular: Normal rate, S1 normal, S2 normal and normal heart sounds. Exam reveals no gallop.  No murmur heard. Pulses:      Dorsalis pedis pulses are 1+ on the right side, and 1+ on the left side.       Posterior tibial pulses are 1+ on the right side, and 1+ on the left side.  Pulmonary/Chest: Breath sounds normal. No respiratory distress. He has no wheezes.  Abdominal: Soft. Bowel sounds  are normal. He exhibits no distension. There is no tenderness. There is no guarding and no CVA tenderness.  Musculoskeletal: He exhibits no edema.       Right shoulder: He exhibits no swelling and no deformity.  Neurological: He is alert and oriented to person, place, and time. He has normal strength and normal reflexes. No cranial nerve deficit or sensory deficit. Gait normal.  Skin: Skin is warm and dry. No rash noted. No cyanosis. Nails show no clubbing.  Psychiatric: He has a normal mood and affect. His speech is normal and behavior is normal. Judgment and thought content normal. Cognition and memory are normal.    Results for orders placed or performed in visit on 02/20/18  CBC  Result Value Ref Range   WBC 7.4 3.8 - 10.8 Thousand/uL   RBC 4.10 (L) 4.20 - 5.80 Million/uL   Hemoglobin 12.4 (L) 13.2 - 17.1 g/dL   HCT 36.1 (L) 38.5 - 50.0 %   MCV 88.0 80.0 - 100.0 fL   MCH 30.2 27.0 - 33.0 pg   MCHC 34.3 32.0 - 36.0 g/dL   RDW 13.0 11.0 - 15.0 %   Platelets 297 140 - 400 Thousand/uL   MPV 10.5 7.5 - 12.5 fL  Lipid panel  Result Value Ref Range   Cholesterol 131 <200 mg/dL   HDL 33 (L) >40 mg/dL   Triglycerides 96 <150 mg/dL   LDL Cholesterol (Calc) 80 mg/dL (calc)   Total CHOL/HDL Ratio 4.0 <5.0 (calc)   Non-HDL Cholesterol (Calc) 98 <130 mg/dL (calc)  TSH  Result Value Ref Range   TSH 1.95 0.40 - 4.50 mIU/L  PSA  Result Value Ref Range   PSA 1.3 < OR = 4.0 ng/mL   Diabetic Labs (most recent): Lab Results  Component Value Date   HGBA1C 7.0 (H) 02/26/2018   HGBA1C 7.0 (H) 10/24/2017   HGBA1C 7.8 (H) 07/03/2017   Lipid Panel     Component Value Date/Time   CHOL 131 02/26/2018 0747   TRIG 96 02/26/2018 0747   HDL 33 (L) 02/26/2018 0747   CHOLHDL 4.0 02/26/2018 0747   VLDL 12 01/16/2017 0940   LDLCALC 80 02/26/2018 0747      Assessment & Plan:   1. diabetes mellitus type 2 without complications, unspecified long term insulin use status ([place]) Patient came with  Controlled fasting and bedtime glucose profile, and his repeat A1c is  7%, improving from 7.8% last visit.     Glucose logs and insulin administration records pertaining to this visit,  to be scanned into patient's records.  Recent labs reviewed, showing mild renal insufficiency. - Patient remains at a high risk for more acute and chronic complications of  diabetes which include CAD, CVA, CKD, retinopathy, and neuropathy. These are all discussed in detail with the patient.  - I have re-counseled the patient on diet management by adopting a carbohydrate restricted / protein rich  Diet. - Patient is advised to stick to a routine mealtimes to eat 3 meals  a day and avoid unnecessary snacks ( to snack only to correct hypoglycemia).  -  Suggestion is made for him to avoid simple carbohydrates  from his diet including Cakes, Sweet Desserts / Pastries, Ice Cream, Soda (diet and regular), Sweet Tea, Candies, Chips, Cookies, Store Bought Juices, Alcohol in Excess of  1-2 drinks a day, Artificial Sweeteners, and "Sugar-free" Products. This will help patient to have stable blood glucose profile and potentially avoid unintended weight gain.  - I have approached patient with the following individualized plan to manage diabetes and patient agrees.   - Based on his current response to basal insulin,he  will not need prandial insulin for now.  - I advised him to continue Lantus   50 units every morning with breakfast instead of at bedtime , associated with strict monitoring of blood glucose 2 times a day-daily before breakfast and at bedtime. - Patient is warned not to take insulin without proper monitoring per orders. -Patient is encouraged to call clinic for blood glucose levels less than 70 or above 300 mg /dl. - I will continue  VICTOZA 1.8  mg subcutaneous daily, therapeutically suitable for patient. -If his renal function improves by next visit, he will be reconsidered for low-dose metformin . He has used  large dose of NSAIDs for arthritis prior to his last visit. He is advised to avoid both over-the-counter medications as well as to maintain adequate hydration.  - Patient specific target  for A1c; LDL, HDL, Triglycerides, and  Waist Circumference were discussed in detail.  2) BP/HTN: His blood pressure is controlled to target.  He is advised to continue his current blood pressure medications including benazepril 20 mg p.o. daily.    3) Lipids/HPL: Recent LDL at target levels 76, continue statins. 4)  Weight/Diet: CDE consult in progress, exercise, and carbohydrates information provided.  5) Chronic Care/Health Maintenance:  -Patient  on ACEI and Statin medications and encouraged to continue to follow up with Ophthalmology, Podiatrist at least yearly or according to recommendations, and advised to stay away from smoking. I have recommended yearly flu vaccine and pneumonia vaccination at least every 5 years; moderate intensity exercise for up to 150 minutes weekly; and  sleep for at least 7 hours a day.  I advised patient to maintain close follow up with his PCP for primary care needs.  - Time spent with the patient: 25 min, of which >50% was spent in reviewing his blood glucose logs , discussing his hypo- and hyper-glycemic episodes, reviewing his current and  previous labs and insulin doses and developing a plan to avoid hypo- and hyper-glycemia. Please refer to Patient Instructions for Blood Glucose Monitoring and Insulin/Medications Dosing Guide"  in media tab for additional information. Travis Herrera participated in the discussions, expressed understanding, and voiced agreement with the above plans.  All questions were answered to his satisfaction. he is encouraged to contact clinic should he have any questions or concerns prior to his return visit.   Follow up plan: Return in about 4 months (around 07/06/2018) for meter, and logs, follow up with pre-visit labs, meter, and logs.  Glade Lloyd, MD Phone: [phone]  Fax: [phone]  This note was  partially dictated with voice recognition software. Similar sounding words can be transcribed inadequately or may not  be corrected upon review.  03/05/2018, 1:49 PM

## 2018-03-06 ENCOUNTER — Ambulatory Visit (INDEPENDENT_AMBULATORY_CARE_PROVIDER_SITE_OTHER): Payer: PPO

## 2018-03-06 ENCOUNTER — Encounter: Payer: Self-pay | Admitting: Orthopedic Surgery

## 2018-03-06 ENCOUNTER — Ambulatory Visit: Payer: PPO | Admitting: Orthopedic Surgery

## 2018-03-06 VITALS — BP 115/75 | HR 80 | Ht 70.0 in | Wt 210.0 lb

## 2018-03-06 DIAGNOSIS — M1710 Unilateral primary osteoarthritis, unspecified knee: Secondary | ICD-10-CM | POA: Diagnosis not present

## 2018-03-06 NOTE — Progress Notes (Signed)
NEW PATIENT OFFICE VISIT   Chief Complaint  Patient presents with  . Knee Pain    left      MEDICAL DECISION SECTION  xrays ordered? Yes   My independent reading of xrays: see report  Summary  15 valgus left knee with obliteration of the lateral compartment and several areas of secondary bone change    Encounter Diagnosis  Name Primary?  Marland Kitchen Arthritis of knee Yes     PLAN:  tka  His knee is so bad that I rec left tka  I did aspirate and inject  Surgery in aug  I ll see him 1 time for preop couseling  Hg a1c 7 (rec < 7.7 J of Arthroplasty 2017 Tarabichi et al)  No orders of the defined types were placed in this encounter. Procedure note injection and aspiration left knee joint  Verbal consent was obtained to aspirate and inject the left knee joint   Timeout was completed to confirm the site of aspiration and injection  An 18-gauge needle was used to aspirate the left knee joint from a suprapatellar lateral approach.  The medications used were 40 mg of Depo-Medrol and 1% lidocaine 3 cc  Anesthesia was provided by ethyl chloride and the skin was prepped with alcohol.  After cleaning the skin with alcohol an 18-gauge needle was used to aspirate the right knee joint.  We obtained 32 cc of fluid  We followed this by injection of 40 mg of Depo-Medrol and 3 cc 1% lidocaine.  There were no complications. A sterile bandage was applied.      Chief Complaint  Patient presents with  . Knee Pain    left     62  Yo male retired presents for evaluation of chronic left knee pain   Mr Filsinger c/o lateral knee for many years with no prior surgery or injury previously treated successfully with aspiration and steroid injection.  He now comes in c/o worsening lateral pain, loss of motion and difficulty with activities of daily living including his daily 2 mile walk   He describes the pain as a dull ache which is non radiating and located along th lateral joint line  present fr over 5 years but worse in the last 6 months      Review of Systems  Respiratory: Negative for shortness of breath.   Cardiovascular: Negative for chest pain.  All other systems reviewed and are negative.    Past Medical History:  Diagnosis Date  . Diabetes mellitus without complication ([place]) 2637   insulin started in 2012  . Hyperlipemia 2013  . Hypertension 2010    Past Surgical History:  Procedure Laterality Date  . CATARACT EXTRACTION W/PHACO  07/29/2012   Procedure: CATARACT EXTRACTION PHACO AND INTRAOCULAR LENS PLACEMENT (IOC);  Surgeon: Tonny Branch, MD;  Location: [facility];  Service: Ophthalmology;  Laterality: Left;  CDE=1.66  . CHOLECYSTECTOMY  2007   [facility]  . EYE SURGERY Left 2013   cataract    Family History  Problem Relation Age of Onset  . Cancer Sister   . Early death Father        car accident   . Cancer Brother   . Hypertension Brother   . Diabetes Brother   . Hypertension Brother    Social History   Tobacco Use  . Smoking status: Never Smoker  . Smokeless tobacco: Never Used  Substance Use Topics  . Alcohol use: No  . Drug use: No   No  Known Allergies   Current Meds  Medication Sig  . acetic acid-hydrocortisone (VOSOL-HC) OTIC solution Two drops to right ear 3 times daily for 5 days then as needed  . amLODipine (NORVASC) 10 MG tablet TAKE 1 TABLET BY MOUTH ONCE DAILY  . aspirin EC 81 MG tablet Take 81 mg by mouth daily.  . benazepril (LOTENSIN) 20 MG tablet TAKE 1 TABLET BY MOUTH ONCE DAILY  . glucose blood (ONETOUCH VERIO) test strip One touch verio strips- three times daily testing  . hydrochlorothiazide (HYDRODIURIL) 25 MG tablet TAKE 1 TABLET BY MOUTH DAILY.  Marland Kitchen Insulin Pen Needle 31G X 8 MM MISC 1 each by Does not apply route 2 (two) times daily.  . Lancets (FREESTYLE) lancets Use as instructed bid  . LANTUS SOLOSTAR 100 UNIT/ML Solostar Pen INJECT 50 UNITS SUBCUTANEOUSLY ONCE DAILY IN THE MORNING  . liraglutide (VICTOZA)  18 MG/3ML SOPN INJECT 1.8 MG INTO THE SKIN DAILY AS DIRECTED  . lovastatin (MEVACOR) 20 MG tablet TAKE 1 TABLET BY MOUTH AT BEDTIME  . Omega-3 Fatty Acids (FISH OIL) 1000 MG CAPS Take 1,000 mg by mouth daily.   Glory Rosebush DELICA LANCETS 58N MISC Three times daily testing dx e11.65  . sildenafil (VIAGRA) 100 MG tablet Take 1 tablet (100 mg total) by mouth daily as needed for erectile dysfunction.  . triamterene-hydrochlorothiazide (MAXZIDE-25) 37.5-25 MG tablet TAKE 1 TABLET BY MOUTH ONCE DAILY    BP 115/75   Pulse 80   Ht 5\' 10"  (1.778 m)   Wt 210 lb (95.3 kg)   BMI 30.13 kg/m   Physical Exam  Constitutional: He is oriented to person, place, and time. He appears well-developed and well-nourished.  Vital signs have been reviewed and are stable. Gen. appearance the patient is well-developed and well-nourished with normal grooming and hygiene.   Musculoskeletal:       Arms: Lymphadenopathy:       Right: No inguinal adenopathy present.       Left: No inguinal adenopathy present.  Neurological: He is alert and oriented to person, place, and time. He displays no atrophy and no tremor. No cranial nerve deficit or sensory deficit. He exhibits normal muscle tone. He displays no seizure activity. Gait abnormal. Coordination normal. He displays no Babinski's sign on the right side. He displays no Babinski's sign on the left side.  Reflex Scores:      Tricep reflexes are 2+ on the right side and 2+ on the left side.      Bicep reflexes are 2+ on the right side and 2+ on the left side.      Patellar reflexes are 2+ on the right side and 2+ on the left side.      Achilles reflexes are 2+ on the right side and 2+ on the left side. Skin: Skin is warm and dry. No erythema.  Psychiatric: He has a normal mood and affect. His speech is normal and behavior is normal. Judgment and thought content normal. Cognition and memory are normal.  Vitals reviewed.   Right Knee Exam   Muscle Strength  The  patient has normal right knee strength.  Tenderness  The patient is experiencing no tenderness.   Range of Motion  Extension: normal  Flexion: normal   Tests  McMurray:  Medial - negative Lateral - negative Varus: negative Valgus: negative Drawer:  Anterior - negative    Posterior - negative  Other  Erythema: absent Scars: absent Sensation: normal Pulse: present Swelling: none  Left Knee Exam   Muscle Strength  The patient has normal left knee strength.  Tenderness  The patient is experiencing tenderness in the lateral joint line and patella.  Range of Motion  Extension:  5 normal  Flexion:  90 normal   Tests  McMurray:  Medial - negative Lateral - negative Varus: negative Valgus: negative Drawer:  Anterior - negative     Posterior - negative  Other  Erythema: absent Scars: absent Sensation: normal Pulse: present Swelling: none  Comments:  Obvious valgus         Arther Abbott, MD  03/06/2018 9:59 PM

## 2018-03-08 ENCOUNTER — Ambulatory Visit: Payer: PPO | Admitting: Orthopedic Surgery

## 2018-03-17 ENCOUNTER — Emergency Department (HOSPITAL_COMMUNITY)
Admission: EM | Admit: 2018-03-17 | Discharge: 2018-03-17 | Disposition: A | Discharge status: Home / Self Care | Payer: PPO | Attending: Emergency Medicine | Admitting: Emergency Medicine

## 2018-03-17 ENCOUNTER — Encounter (HOSPITAL_COMMUNITY): Payer: Self-pay | Admitting: *Deleted

## 2018-03-17 ENCOUNTER — Emergency Department (HOSPITAL_COMMUNITY): Payer: PPO

## 2018-03-17 DIAGNOSIS — M66851 Spontaneous rupture of other tendons, right thigh: Secondary | ICD-10-CM | POA: Diagnosis not present

## 2018-03-17 DIAGNOSIS — E1122 Type 2 diabetes mellitus with diabetic chronic kidney disease: Secondary | ICD-10-CM | POA: Diagnosis not present

## 2018-03-17 DIAGNOSIS — Y929 Unspecified place or not applicable: Secondary | ICD-10-CM | POA: Diagnosis not present

## 2018-03-17 DIAGNOSIS — Z79899 Other long term (current) drug therapy: Secondary | ICD-10-CM | POA: Insufficient documentation

## 2018-03-17 DIAGNOSIS — I129 Hypertensive chronic kidney disease with stage 1 through stage 4 chronic kidney disease, or unspecified chronic kidney disease: Secondary | ICD-10-CM | POA: Diagnosis not present

## 2018-03-17 DIAGNOSIS — Z794 Long term (current) use of insulin: Secondary | ICD-10-CM | POA: Diagnosis not present

## 2018-03-17 DIAGNOSIS — Z7982 Long term (current) use of aspirin: Secondary | ICD-10-CM | POA: Insufficient documentation

## 2018-03-17 DIAGNOSIS — S76111A Strain of right quadriceps muscle, fascia and tendon, initial encounter: Secondary | ICD-10-CM

## 2018-03-17 DIAGNOSIS — E785 Hyperlipidemia, unspecified: Secondary | ICD-10-CM | POA: Diagnosis not present

## 2018-03-17 DIAGNOSIS — M25561 Pain in right knee: Secondary | ICD-10-CM | POA: Diagnosis not present

## 2018-03-17 DIAGNOSIS — N182 Chronic kidney disease, stage 2 (mild): Secondary | ICD-10-CM | POA: Diagnosis not present

## 2018-03-17 DIAGNOSIS — Y9389 Activity, other specified: Secondary | ICD-10-CM | POA: Insufficient documentation

## 2018-03-17 DIAGNOSIS — Y33XXXA Other specified events, undetermined intent, initial encounter: Secondary | ICD-10-CM | POA: Diagnosis not present

## 2018-03-17 DIAGNOSIS — Y998 Other external cause status: Secondary | ICD-10-CM | POA: Diagnosis not present

## 2018-03-17 MED ORDER — OXYCODONE-ACETAMINOPHEN 5-325 MG PO TABS: 1.0000 | ORAL_TABLET | ORAL | 0 refills | Status: DC | PRN

## 2018-03-17 MED ORDER — OXYCODONE-ACETAMINOPHEN 5-325 MG PO TABS
2.0000 | ORAL_TABLET | Freq: Once | ORAL | Status: AC
  Administered 2018-03-17: 2 via ORAL
  Filled 2018-03-17: qty 2

## 2018-03-17 NOTE — ED Triage Notes (Signed)
Pt fell after tripping, states it popped to right knee.  Not able to put weight on it.

## 2018-03-18 ENCOUNTER — Encounter (HOSPITAL_COMMUNITY): Payer: Self-pay

## 2018-03-18 ENCOUNTER — Encounter: Payer: Self-pay | Admitting: *Deleted

## 2018-03-18 ENCOUNTER — Telehealth: Payer: Self-pay | Admitting: Orthopedic Surgery

## 2018-03-18 ENCOUNTER — Encounter: Payer: Self-pay | Admitting: Orthopedic Surgery

## 2018-03-18 ENCOUNTER — Encounter (HOSPITAL_COMMUNITY)
Admission: RE | Admit: 2018-03-18 | Discharge: 2018-03-18 | Disposition: A | Discharge status: Home / Self Care | Payer: PPO | Source: Ambulatory Visit | Attending: Orthopedic Surgery | Admitting: Orthopedic Surgery

## 2018-03-18 ENCOUNTER — Ambulatory Visit: Payer: PPO | Admitting: Orthopedic Surgery

## 2018-03-18 VITALS — BP 123/73 | HR 75 | Ht 70.0 in | Wt 206.0 lb

## 2018-03-18 DIAGNOSIS — E119 Type 2 diabetes mellitus without complications: Secondary | ICD-10-CM | POA: Diagnosis not present

## 2018-03-18 DIAGNOSIS — Z7982 Long term (current) use of aspirin: Secondary | ICD-10-CM | POA: Diagnosis not present

## 2018-03-18 DIAGNOSIS — Z794 Long term (current) use of insulin: Secondary | ICD-10-CM | POA: Diagnosis not present

## 2018-03-18 DIAGNOSIS — Z8249 Family history of ischemic heart disease and other diseases of the circulatory system: Secondary | ICD-10-CM | POA: Diagnosis not present

## 2018-03-18 DIAGNOSIS — Z9049 Acquired absence of other specified parts of digestive tract: Secondary | ICD-10-CM | POA: Diagnosis not present

## 2018-03-18 DIAGNOSIS — Z79891 Long term (current) use of opiate analgesic: Secondary | ICD-10-CM | POA: Diagnosis not present

## 2018-03-18 DIAGNOSIS — S76111A Strain of right quadriceps muscle, fascia and tendon, initial encounter: Secondary | ICD-10-CM | POA: Diagnosis not present

## 2018-03-18 DIAGNOSIS — W1840XA Slipping, tripping and stumbling without falling, unspecified, initial encounter: Secondary | ICD-10-CM | POA: Diagnosis not present

## 2018-03-18 DIAGNOSIS — E785 Hyperlipidemia, unspecified: Secondary | ICD-10-CM | POA: Diagnosis not present

## 2018-03-18 DIAGNOSIS — Z9842 Cataract extraction status, left eye: Secondary | ICD-10-CM | POA: Diagnosis not present

## 2018-03-18 DIAGNOSIS — Z961 Presence of intraocular lens: Secondary | ICD-10-CM | POA: Diagnosis not present

## 2018-03-18 DIAGNOSIS — I10 Essential (primary) hypertension: Secondary | ICD-10-CM | POA: Diagnosis not present

## 2018-03-18 NOTE — Progress Notes (Signed)
PREOP CONSULT/REFERRAL emergency room   Chief Complaint  Patient presents with  . New Problem    ER Follow up R knee injury DOI 03/17/18    MEDICAL DECISION SECTION  xrays ordered? no  My independent reading of xrays: X-rays at the hospital right knee.  Large joint effusion.  I also see fragments from the superior portion of the patella indicating possible avulsion.  There may be some low riding of the patella.  There is degenerative arthritis of the joint.   Encounter Diagnosis  Name Primary?  . Quadriceps tendon rupture, right, initial encounter Yes     PLAN:   Surgical procedure planned: Open repair right quadriceps tendon  The procedure has been fully reviewed with the patient; The risks and benefits of surgery have been discussed and explained and understood. Alternative treatment has also been reviewed, questions were encouraged and answered. The postoperative plan is also been reviewed.  Nonsurgical treatment as described in the history and physical section was attempted and unsuccessful and the patient has agreed to proceed with surgical intervention to improve their situation.  No orders of the defined types were placed in this encounter.      Chief Complaint  Patient presents with  . New Problem    ER Follow up R knee injury DOI 03/17/18    62 year old male scheduled for total knee later in the summer presents with a acute injury to his right knee.  He was outside last evening with his children playing in the pool he tripped fell felt a pop and acute pain over the right knee and then went out was unable to weight-bear  He comes in with mild pain at rest over the right patella for 1 day associated with swelling and inability to weight-bear.  He has a quadriceps tendon rupture will require surgery   Review of Systems  Respiratory: Negative for shortness of breath.   Cardiovascular: Negative for chest pain.     Past Medical History:  Diagnosis Date  . Diabetes  mellitus without complication ([place]) 1443   insulin started in 2012  . Hyperlipemia 2013  . Hypertension 2010    Past Surgical History:  Procedure Laterality Date  . CATARACT EXTRACTION W/PHACO  07/29/2012   Procedure: CATARACT EXTRACTION PHACO AND INTRAOCULAR LENS PLACEMENT (IOC);  Surgeon: Tonny Branch, MD;  Location: [facility];  Service: Ophthalmology;  Laterality: Left;  CDE=1.66  . CHOLECYSTECTOMY  2007   [facility]  . EYE SURGERY Left 2013   cataract    Family History  Problem Relation Age of Onset  . Cancer Sister   . Early death Father        car accident   . Cancer Brother   . Hypertension Brother   . Diabetes Brother   . Hypertension Brother    Social History   Tobacco Use  . Smoking status: Never Smoker  . Smokeless tobacco: Never Used  Substance Use Topics  . Alcohol use: No  . Drug use: No   No Known Allergies    Current Meds  Medication Sig  . acetic acid-hydrocortisone (VOSOL-HC) OTIC solution Two drops to right ear 3 times daily for 5 days then as needed  . amLODipine (NORVASC) 10 MG tablet TAKE 1 TABLET BY MOUTH ONCE DAILY  . aspirin EC 81 MG tablet Take 81 mg by mouth daily.  . benazepril (LOTENSIN) 20 MG tablet TAKE 1 TABLET BY MOUTH ONCE DAILY  . glucose blood (ONETOUCH VERIO) test strip One touch verio  strips- three times daily testing  . hydrochlorothiazide (HYDRODIURIL) 25 MG tablet TAKE 1 TABLET BY MOUTH DAILY.  Marland Kitchen Insulin Pen Needle 31G X 8 MM MISC 1 each by Does not apply route 2 (two) times daily.  . Lancets (FREESTYLE) lancets Use as instructed bid  . LANTUS SOLOSTAR 100 UNIT/ML Solostar Pen INJECT 50 UNITS SUBCUTANEOUSLY ONCE DAILY IN THE MORNING  . liraglutide (VICTOZA) 18 MG/3ML SOPN INJECT 1.8 MG INTO THE SKIN DAILY AS DIRECTED  . lovastatin (MEVACOR) 20 MG tablet TAKE 1 TABLET BY MOUTH AT BEDTIME  . Omega-3 Fatty Acids (FISH OIL) 1000 MG CAPS Take 1,000 mg by mouth daily.   Glory Rosebush DELICA LANCETS 09F MISC Three times daily testing dx  e11.65  . oxyCODONE-acetaminophen (PERCOCET/ROXICET) 5-325 MG tablet Take 1-2 tablets by mouth every 4 (four) hours as needed.  . sildenafil (VIAGRA) 100 MG tablet Take 1 tablet (100 mg total) by mouth daily as needed for erectile dysfunction.  . triamterene-hydrochlorothiazide (MAXZIDE-25) 37.5-25 MG tablet TAKE 1 TABLET BY MOUTH ONCE DAILY    BP 123/73   Pulse 75   Ht 5\' 10"  (1.778 m)   Wt 206 lb (93.4 kg)   BMI 29.56 kg/m   Physical Exam  Constitutional: He is oriented to person, place, and time. He appears well-developed and well-nourished.  Vital signs have been reviewed and are stable. Gen. appearance the patient is well-developed and well-nourished with normal grooming and hygiene.   Neurological: He is alert and oriented to person, place, and time. Gait abnormal.  Unable to weight-bear without support  Skin: Skin is warm and dry. No erythema.  Psychiatric: He has a normal mood and affect.  Vitals reviewed.   Ortho Exam  Right knee has a palpable defect at the quadriceps tendon with large joint effusion he has flexion no active extension no active straight leg raise knee is otherwise stable strength 0 out of 5 quadricep strength skin is normal pulse and perfusion normal groin lymph nodes negative sensation normal right leg no pathologic reflexes  Left knee limited range of motion valgus deformity lateral tenderness again was scheduled for total knee that will have to be canceled  Arther Abbott, MD 03/18/2018 9:50 AM

## 2018-03-18 NOTE — Progress Notes (Signed)
No pre cert required for CPT 27385 per Evangela with [employer].

## 2018-03-18 NOTE — Telephone Encounter (Signed)
27385 CPT

## 2018-03-18 NOTE — Telephone Encounter (Addendum)
Call received from [facility] at [facility], requesting CPT code for surgery scheduled for tomorrow, 03/19/18.  Her direct contact ph# is [phone].

## 2018-03-19 ENCOUNTER — Other Ambulatory Visit: Payer: Self-pay | Admitting: Orthopedic Surgery

## 2018-03-19 ENCOUNTER — Encounter (HOSPITAL_COMMUNITY)
Admission: RE | Disposition: A | Discharge status: Home / Self Care | Payer: Self-pay | Source: Ambulatory Visit | Attending: Orthopedic Surgery

## 2018-03-19 ENCOUNTER — Ambulatory Visit (HOSPITAL_COMMUNITY)
Admission: RE | Admit: 2018-03-19 | Discharge: 2018-03-19 | Disposition: A | Discharge status: Home / Self Care | Payer: PPO | Source: Ambulatory Visit | Attending: Orthopedic Surgery | Admitting: Orthopedic Surgery

## 2018-03-19 ENCOUNTER — Other Ambulatory Visit: Payer: Self-pay

## 2018-03-19 ENCOUNTER — Encounter (HOSPITAL_COMMUNITY): Payer: Self-pay

## 2018-03-19 ENCOUNTER — Ambulatory Visit (HOSPITAL_COMMUNITY): Payer: PPO | Admitting: Anesthesiology

## 2018-03-19 ENCOUNTER — Ambulatory Visit (HOSPITAL_COMMUNITY): Payer: PPO

## 2018-03-19 DIAGNOSIS — E785 Hyperlipidemia, unspecified: Secondary | ICD-10-CM | POA: Insufficient documentation

## 2018-03-19 DIAGNOSIS — Z8249 Family history of ischemic heart disease and other diseases of the circulatory system: Secondary | ICD-10-CM | POA: Insufficient documentation

## 2018-03-19 DIAGNOSIS — I10 Essential (primary) hypertension: Secondary | ICD-10-CM | POA: Insufficient documentation

## 2018-03-19 DIAGNOSIS — Z9049 Acquired absence of other specified parts of digestive tract: Secondary | ICD-10-CM | POA: Diagnosis not present

## 2018-03-19 DIAGNOSIS — Z01818 Encounter for other preprocedural examination: Secondary | ICD-10-CM

## 2018-03-19 DIAGNOSIS — Z9889 Other specified postprocedural states: Principal | ICD-10-CM

## 2018-03-19 DIAGNOSIS — Z961 Presence of intraocular lens: Secondary | ICD-10-CM | POA: Insufficient documentation

## 2018-03-19 DIAGNOSIS — S76111D Strain of right quadriceps muscle, fascia and tendon, subsequent encounter: Secondary | ICD-10-CM

## 2018-03-19 DIAGNOSIS — E119 Type 2 diabetes mellitus without complications: Secondary | ICD-10-CM | POA: Insufficient documentation

## 2018-03-19 DIAGNOSIS — Z794 Long term (current) use of insulin: Secondary | ICD-10-CM | POA: Diagnosis not present

## 2018-03-19 DIAGNOSIS — Z7982 Long term (current) use of aspirin: Secondary | ICD-10-CM | POA: Insufficient documentation

## 2018-03-19 DIAGNOSIS — W1840XA Slipping, tripping and stumbling without falling, unspecified, initial encounter: Secondary | ICD-10-CM | POA: Insufficient documentation

## 2018-03-19 DIAGNOSIS — Z9842 Cataract extraction status, left eye: Secondary | ICD-10-CM | POA: Diagnosis not present

## 2018-03-19 DIAGNOSIS — S76111A Strain of right quadriceps muscle, fascia and tendon, initial encounter: Secondary | ICD-10-CM | POA: Diagnosis not present

## 2018-03-19 DIAGNOSIS — Z79891 Long term (current) use of opiate analgesic: Secondary | ICD-10-CM | POA: Diagnosis not present

## 2018-03-19 DIAGNOSIS — R112 Nausea with vomiting, unspecified: Secondary | ICD-10-CM

## 2018-03-19 HISTORY — PX: QUADRICEPS TENDON REPAIR: SHX756

## 2018-03-19 LAB — GLUCOSE, CAPILLARY
GLUCOSE-CAPILLARY: 114 mg/dL — AB (ref 65–99)
GLUCOSE-CAPILLARY: 113 mg/dL — AB (ref 65–99)

## 2018-03-19 SURGERY — REPAIR, TENDON, QUADRICEPS
Anesthesia: General | Laterality: Right

## 2018-03-19 MED ORDER — GABAPENTIN 100 MG PO CAPS: 100.0000 mg | ORAL_CAPSULE | Freq: Three times a day (TID) | ORAL | 2 refills | Status: DC

## 2018-03-19 MED ORDER — PHENYLEPHRINE HCL 10 MG/ML IJ SOLN
INTRAMUSCULAR | Status: DC | PRN
  Administered 2018-03-19: 80 ug via INTRAVENOUS

## 2018-03-19 MED ORDER — ONDANSETRON HCL 4 MG/2ML IJ SOLN
INTRAMUSCULAR | Status: AC
  Filled 2018-03-19: qty 4

## 2018-03-19 MED ORDER — EPHEDRINE SULFATE 50 MG/ML IJ SOLN
INTRAMUSCULAR | Status: DC | PRN
  Administered 2018-03-19: 10 mg via INTRAVENOUS
  Administered 2018-03-19: 5 mg via INTRAVENOUS
  Administered 2018-03-19: 10 mg via INTRAVENOUS

## 2018-03-19 MED ORDER — CEFAZOLIN SODIUM-DEXTROSE 2-4 GM/100ML-% IV SOLN
INTRAVENOUS | Status: AC
  Filled 2018-03-19: qty 100

## 2018-03-19 MED ORDER — SODIUM CHLORIDE 0.9 % IR SOLN
Status: DC | PRN
  Administered 2018-03-19: 1

## 2018-03-19 MED ORDER — MIDAZOLAM HCL 5 MG/5ML IJ SOLN
INTRAMUSCULAR | Status: DC | PRN
  Administered 2018-03-19 (×2): 2 mg via INTRAVENOUS

## 2018-03-19 MED ORDER — MIDAZOLAM HCL 2 MG/2ML IJ SOLN
INTRAMUSCULAR | Status: AC
  Filled 2018-03-19: qty 2

## 2018-03-19 MED ORDER — SODIUM CHLORIDE 0.9 % IJ SOLN
INTRAMUSCULAR | Status: AC
  Filled 2018-03-19: qty 10

## 2018-03-19 MED ORDER — CHLORHEXIDINE GLUCONATE 4 % EX LIQD: 60.0000 mL | Freq: Once | CUTANEOUS | Status: DC

## 2018-03-19 MED ORDER — DEXAMETHASONE SODIUM PHOSPHATE 4 MG/ML IJ SOLN
INTRAMUSCULAR | Status: AC
  Filled 2018-03-19: qty 1

## 2018-03-19 MED ORDER — IBUPROFEN 800 MG PO TABS
800.0000 mg | ORAL_TABLET | Freq: Once | ORAL | Status: AC
  Administered 2018-03-19: 800 mg via ORAL
  Filled 2018-03-19: qty 1

## 2018-03-19 MED ORDER — EPHEDRINE SULFATE 50 MG/ML IJ SOLN
INTRAMUSCULAR | Status: AC
  Filled 2018-03-19: qty 1

## 2018-03-19 MED ORDER — FENTANYL CITRATE (PF) 100 MCG/2ML IJ SOLN
INTRAMUSCULAR | Status: DC | PRN
  Administered 2018-03-19: 25 ug via INTRAVENOUS
  Administered 2018-03-19 (×3): 50 ug via INTRAVENOUS

## 2018-03-19 MED ORDER — HYDROCODONE-ACETAMINOPHEN 5-325 MG PO TABS: 1.0000 | ORAL_TABLET | ORAL | Status: DC | PRN

## 2018-03-19 MED ORDER — LACTATED RINGERS IV SOLN
INTRAVENOUS | Status: DC
  Administered 2018-03-19 (×2): via INTRAVENOUS

## 2018-03-19 MED ORDER — ROPIVACAINE HCL 5 MG/ML IJ SOLN
INTRAMUSCULAR | Status: AC
  Filled 2018-03-19: qty 30

## 2018-03-19 MED ORDER — HYDROCODONE-ACETAMINOPHEN 5-325 MG PO TABS: 1.0000 | ORAL_TABLET | ORAL | 0 refills | Status: AC | PRN

## 2018-03-19 MED ORDER — LIDOCAINE HCL (CARDIAC) PF 100 MG/5ML IV SOSY
PREFILLED_SYRINGE | INTRAVENOUS | Status: DC | PRN
  Administered 2018-03-19: 40 mg via INTRAVENOUS

## 2018-03-19 MED ORDER — PHENYLEPHRINE 40 MCG/ML (10ML) SYRINGE FOR IV PUSH (FOR BLOOD PRESSURE SUPPORT)
PREFILLED_SYRINGE | INTRAVENOUS | Status: AC
  Filled 2018-03-19: qty 10

## 2018-03-19 MED ORDER — PROPOFOL 10 MG/ML IV BOLUS
INTRAVENOUS | Status: AC
  Filled 2018-03-19: qty 20

## 2018-03-19 MED ORDER — POVIDONE-IODINE 10 % EX SWAB: 2.0000 "application " | Freq: Once | CUTANEOUS | Status: DC

## 2018-03-19 MED ORDER — PROPOFOL 10 MG/ML IV BOLUS
INTRAVENOUS | Status: DC | PRN
  Administered 2018-03-19: 150 mg via INTRAVENOUS

## 2018-03-19 MED ORDER — GABAPENTIN 300 MG PO CAPS
300.0000 mg | ORAL_CAPSULE | Freq: Once | ORAL | Status: AC
  Administered 2018-03-19: 300 mg via ORAL
  Filled 2018-03-19: qty 1

## 2018-03-19 MED ORDER — ONDANSETRON HCL 4 MG PO TABS: 4.0000 mg | ORAL_TABLET | Freq: Four times a day (QID) | ORAL | 4 refills | Status: DC

## 2018-03-19 MED ORDER — KETOROLAC TROMETHAMINE 30 MG/ML IJ SOLN: 30.0000 mg | Freq: Once | INTRAMUSCULAR | Status: DC | PRN

## 2018-03-19 MED ORDER — MIDAZOLAM HCL 2 MG/2ML IJ SOLN
INTRAMUSCULAR | Status: AC
Start: 2018-03-19 — End: ?
  Filled 2018-03-19: qty 4

## 2018-03-19 MED ORDER — BUPIVACAINE LIPOSOME 1.3 % IJ SUSP
INTRAMUSCULAR | Status: DC | PRN
  Administered 2018-03-19: 20 mL

## 2018-03-19 MED ORDER — ROCURONIUM BROMIDE 50 MG/5ML IV SOLN
INTRAVENOUS | Status: AC
  Filled 2018-03-19: qty 1

## 2018-03-19 MED ORDER — HYDROMORPHONE HCL 1 MG/ML IJ SOLN
0.2500 mg | INTRAMUSCULAR | Status: DC | PRN
  Administered 2018-03-19: 0.5 mg via INTRAVENOUS
  Filled 2018-03-19: qty 0.5

## 2018-03-19 MED ORDER — CEFAZOLIN SODIUM-DEXTROSE 2-4 GM/100ML-% IV SOLN
2.0000 g | INTRAVENOUS | Status: AC
  Administered 2018-03-19: 2 g via INTRAVENOUS

## 2018-03-19 MED ORDER — ONDANSETRON HCL 4 MG/2ML IJ SOLN
INTRAMUSCULAR | Status: DC | PRN
  Administered 2018-03-19: 4 mg via INTRAVENOUS

## 2018-03-19 MED ORDER — IBUPROFEN 800 MG PO TABS: 800.0000 mg | ORAL_TABLET | Freq: Three times a day (TID) | ORAL | 1 refills | Status: DC | PRN

## 2018-03-19 MED ORDER — FENTANYL CITRATE (PF) 250 MCG/5ML IJ SOLN
INTRAMUSCULAR | Status: AC
  Filled 2018-03-19: qty 5

## 2018-03-19 MED ORDER — HYDROCODONE-ACETAMINOPHEN 7.5-325 MG PO TABS
1.0000 | ORAL_TABLET | Freq: Once | ORAL | Status: AC | PRN
  Administered 2018-03-19: 1 via ORAL
  Filled 2018-03-19: qty 1

## 2018-03-19 MED ORDER — ONDANSETRON HCL 4 MG/2ML IJ SOLN
4.0000 mg | Freq: Once | INTRAMUSCULAR | Status: AC | PRN
  Administered 2018-03-19: 4 mg via INTRAVENOUS
  Filled 2018-03-19: qty 2

## 2018-03-19 MED ORDER — BUPIVACAINE LIPOSOME 1.3 % IJ SUSP
INTRAMUSCULAR | Status: AC
  Filled 2018-03-19: qty 20

## 2018-03-19 MED ORDER — MEPERIDINE HCL 50 MG/ML IJ SOLN: 6.2500 mg | INTRAMUSCULAR | Status: DC | PRN

## 2018-03-19 MED ORDER — ACETAMINOPHEN 500 MG PO TABS: 500.0000 mg | ORAL_TABLET | Freq: Once | ORAL | Status: DC

## 2018-03-19 SURGICAL SUPPLY — 58 items
ANCHOR CORKSCREW 6.5 FIBERWIRE (Anchor) ×2 IMPLANT
ANCHOR SUT CORKSCREW 5X15.5 (Anchor) ×2 IMPLANT
BANDAGE ACE 4X5 VEL STRL LF (GAUZE/BANDAGES/DRESSINGS) ×2 IMPLANT
BANDAGE ACE 6X5 VEL STRL LF (GAUZE/BANDAGES/DRESSINGS) ×2 IMPLANT
BANDAGE ELASTIC 4 LF NS (GAUZE/BANDAGES/DRESSINGS) ×2 IMPLANT
BANDAGE ELASTIC 6 LF NS (GAUZE/BANDAGES/DRESSINGS) ×2 IMPLANT
BANDAGE ESMARK 6X9 LF (GAUZE/BANDAGES/DRESSINGS) ×1 IMPLANT
BIT DRILL 2.8X128 (BIT) ×2 IMPLANT
BLADE SURG SZ10 CARB STEEL (BLADE) ×2 IMPLANT
BNDG COHESIVE 4X5 TAN STRL (GAUZE/BANDAGES/DRESSINGS) ×2 IMPLANT
BNDG ESMARK 6X9 LF (GAUZE/BANDAGES/DRESSINGS) ×2
CHLORAPREP W/TINT 26ML (MISCELLANEOUS) ×2 IMPLANT
CLOTH BEACON ORANGE TIMEOUT ST (SAFETY) ×2 IMPLANT
COVER LIGHT HANDLE STERIS (MISCELLANEOUS) ×4 IMPLANT
CUFF TOURNIQUET SINGLE 34IN LL (TOURNIQUET CUFF) ×2 IMPLANT
DECANTER SPIKE VIAL GLASS SM (MISCELLANEOUS) IMPLANT
DRAPE INCISE IOBAN 44X35 STRL (DRAPES) IMPLANT
DRESSING XEROFORM 5X9 (GAUZE/BANDAGES/DRESSINGS) ×2 IMPLANT
ELECT REM PT RETURN 9FT ADLT (ELECTROSURGICAL) ×2
ELECTRODE REM PT RTRN 9FT ADLT (ELECTROSURGICAL) ×1 IMPLANT
GAUZE XEROFORM 5X9 LF (GAUZE/BANDAGES/DRESSINGS) ×2 IMPLANT
GLOVE BIO SURGEON STRL SZ7 (GLOVE) ×4 IMPLANT
GLOVE BIOGEL PI IND STRL 7.0 (GLOVE) ×2 IMPLANT
GLOVE BIOGEL PI INDICATOR 7.0 (GLOVE) ×2
GLOVE SKINSENSE NS SZ8.0 LF (GLOVE) ×1
GLOVE SKINSENSE STRL SZ8.0 LF (GLOVE) ×1 IMPLANT
GLOVE SS N UNI LF 8.5 STRL (GLOVE) ×2 IMPLANT
GOWN STRL REUS W/ TWL LRG LVL3 (GOWN DISPOSABLE) ×1 IMPLANT
GOWN STRL REUS W/TWL LRG LVL3 (GOWN DISPOSABLE) ×3 IMPLANT
GOWN STRL REUS W/TWL XL LVL3 (GOWN DISPOSABLE) ×2 IMPLANT
IMMOBILIZER KNEE 19 UNV (ORTHOPEDIC SUPPLIES) IMPLANT
INST SET MAJOR BONE (KITS) ×2 IMPLANT
KIT TURNOVER KIT A (KITS) ×2 IMPLANT
MANIFOLD NEPTUNE II (INSTRUMENTS) ×2 IMPLANT
NEEDLE HYPO 21X1.5 SAFETY (NEEDLE) ×2 IMPLANT
NEEDLE MAYO 1/2 CRC TROCAR PT (NEEDLE) ×2 IMPLANT
NEEDLE MAYO 6 CRC TAPER PT (NEEDLE) IMPLANT
NS IRRIG 1000ML POUR BTL (IV SOLUTION) ×2 IMPLANT
PACK BASIC LIMB (CUSTOM PROCEDURE TRAY) ×2 IMPLANT
PAD ABD 5X9 TENDERSORB (GAUZE/BANDAGES/DRESSINGS) ×2 IMPLANT
PAD ARMBOARD 7.5X6 YLW CONV (MISCELLANEOUS) ×2 IMPLANT
PAD CAST 4YDX4 CTTN HI CHSV (CAST SUPPLIES) ×1 IMPLANT
PADDING CAST COTTON 4X4 STRL (CAST SUPPLIES) ×1
PADDING CAST COTTON 6X4 STRL (CAST SUPPLIES) ×2 IMPLANT
PASSER SUT SWANSON 36MM LOOP (INSTRUMENTS) ×2 IMPLANT
SET BASIN LINEN APH (SET/KITS/TRAYS/PACK) ×2 IMPLANT
SPONGE GAUZE 4X4 12PLY STER LF (GAUZE/BANDAGES/DRESSINGS) ×2 IMPLANT
SPONGE LAP 18X18 X RAY DECT (DISPOSABLE) ×4 IMPLANT
STAPLER VISISTAT 35W (STAPLE) ×2 IMPLANT
SUT BRALON NAB BRD #1 30IN (SUTURE) IMPLANT
SUT ETHIBOND 5 LR DA (SUTURE) ×2 IMPLANT
SUT ETHIBOND NAB OS 4 #2 30IN (SUTURE) IMPLANT
SUT MON AB 0 CT1 (SUTURE) ×4 IMPLANT
SUT MON AB 2-0 CT1 36 (SUTURE) ×2 IMPLANT
SUT VIC AB 1 CT1 27 (SUTURE) ×2
SUT VIC AB 1 CT1 27XBRD ANTBC (SUTURE) ×2 IMPLANT
SYR 30ML LL (SYRINGE) ×2 IMPLANT
SYR BULB IRRIGATION 50ML (SYRINGE) ×2 IMPLANT

## 2018-03-19 NOTE — Anesthesia Postprocedure Evaluation (Signed)
Anesthesia Post Note  Patient: Travis Herrera  Procedure(s) Performed: REPAIR QUADRICEP TENDON (Right )  Patient location during evaluation: PACU Anesthesia Type: General Level of consciousness: awake and alert and oriented Pain management: pain level controlled Vital Signs Assessment: post-procedure vital signs reviewed and stable Respiratory status: spontaneous breathing and respiratory function stable Cardiovascular status: stable Postop Assessment: no apparent nausea or vomiting Anesthetic complications: no     Last Vitals:  Vitals:   03/19/18 0800 03/19/18 1015  BP: 116/70 109/72  Pulse: 70 64  Resp: 18 10  Temp:  36.8 C  SpO2: 100% 96%    Last Pain:  Vitals:   03/19/18 1030  TempSrc:   PainSc: 7                  Shenita Trego A

## 2018-03-19 NOTE — Anesthesia Procedure Notes (Signed)
Procedure Name: LMA Insertion Date/Time: 03/19/2018 8:41 AM Performed by: Andree Elk, Angelgabriel Willmore A, CRNA Pre-anesthesia Checklist: Timeout performed, Patient identified, Emergency Drugs available, Suction available and Patient being monitored Patient Re-evaluated:Patient Re-evaluated prior to induction Oxygen Delivery Method: Circle system utilized Preoxygenation: Pre-oxygenation with 100% oxygen Induction Type: IV induction Ventilation: Mask ventilation without difficulty LMA: LMA inserted LMA Size: 5.0 Number of attempts: 1 Placement Confirmation: positive ETCO2 and breath sounds checked- equal and bilateral Tube secured with: Tape Dental Injury: Teeth and Oropharynx as per pre-operative assessment

## 2018-03-19 NOTE — H&P (Signed)
PREOP CONSULT/REFERRAL emergency room         Chief Complaint  Patient presents with  . New Problem      ER Follow up R knee injury DOI 03/17/18      MEDICAL DECISION SECTION  xrays ordered? no   My independent reading of xrays: X-rays at the hospital right knee.  Large joint effusion.  I also see fragments from the superior portion of the patella indicating possible avulsion.  There may be some low riding of the patella.  There is degenerative arthritis of the joint.         Encounter Diagnosis  Name Primary?  . Quadriceps tendon rupture, right, initial encounter Yes        PLAN:    Surgical procedure planned: Open repair right quadriceps tendon   The procedure has been fully reviewed with the patient; The risks and benefits of surgery have been discussed and explained and understood. Alternative treatment has also been reviewed, questions were encouraged and answered. The postoperative plan is also been reviewed.   Nonsurgical treatment as described in the history and physical section was attempted and unsuccessful and the patient has agreed to proceed with surgical intervention to improve their situation.   No orders of the defined types were placed in this encounter.               Chief Complaint  Patient presents with  . New Problem      ER Follow up R knee injury DOI 03/17/18      62 year old male scheduled for total knee later in the summer presents with a acute injury to his right knee.  He was outside last evening with his children playing in the pool he tripped fell felt a pop and acute pain over the right knee and then went out was unable to weight-bear   He comes in with mild pain at rest over the right patella for 1 day associated with swelling and inability to weight-bear.  He has a quadriceps tendon rupture will require surgery     Review of Systems  Respiratory: Negative for shortness of breath.   Cardiovascular: Negative for chest pain.             Past Medical History:  Diagnosis Date  . Diabetes mellitus without complication ([place]) 5056    insulin started in 2012  . Hyperlipemia 2013  . Hypertension 2010           Past Surgical History:  Procedure Laterality Date  . CATARACT EXTRACTION W/PHACO   07/29/2012    Procedure: CATARACT EXTRACTION PHACO AND INTRAOCULAR LENS PLACEMENT (IOC);  Surgeon: Tonny Branch, MD;  Location: [facility];  Service: Ophthalmology;  Laterality: Left;  CDE=1.66  . CHOLECYSTECTOMY   2007    [facility]  . EYE SURGERY Left 2013    cataract           Family History  Problem Relation Age of Onset  . Cancer Sister    . Early death Father          car accident   . Cancer Brother    . Hypertension Brother    . Diabetes Brother    . Hypertension Brother      Social History        Tobacco Use  . Smoking status: Never Smoker  . Smokeless tobacco: Never Used  Substance Use Topics  . Alcohol use: No  . Drug use: No    No Known Allergies  Active Medications      Current Meds  Medication Sig  . acetic acid-hydrocortisone (VOSOL-HC) OTIC solution Two drops to right ear 3 times daily for 5 days then as needed  . amLODipine (NORVASC) 10 MG tablet TAKE 1 TABLET BY MOUTH ONCE DAILY  . aspirin EC 81 MG tablet Take 81 mg by mouth daily.  . benazepril (LOTENSIN) 20 MG tablet TAKE 1 TABLET BY MOUTH ONCE DAILY  . glucose blood (ONETOUCH VERIO) test strip One touch verio strips- three times daily testing  . hydrochlorothiazide (HYDRODIURIL) 25 MG tablet TAKE 1 TABLET BY MOUTH DAILY.  Marland Kitchen Insulin Pen Needle 31G X 8 MM MISC 1 each by Does not apply route 2 (two) times daily.  . Lancets (FREESTYLE) lancets Use as instructed bid  . LANTUS SOLOSTAR 100 UNIT/ML Solostar Pen INJECT 50 UNITS SUBCUTANEOUSLY ONCE DAILY IN THE MORNING  . liraglutide (VICTOZA) 18 MG/3ML SOPN INJECT 1.8 MG INTO THE SKIN DAILY AS DIRECTED  . lovastatin (MEVACOR) 20 MG tablet TAKE 1 TABLET BY MOUTH AT BEDTIME  . Omega-3 Fatty Acids  (FISH OIL) 1000 MG CAPS Take 1,000 mg by mouth daily.   Glory Rosebush DELICA LANCETS 09X MISC Three times daily testing dx e11.65  . oxyCODONE-acetaminophen (PERCOCET/ROXICET) 5-325 MG tablet Take 1-2 tablets by mouth every 4 (four) hours as needed.  . sildenafil (VIAGRA) 100 MG tablet Take 1 tablet (100 mg total) by mouth daily as needed for erectile dysfunction.  . triamterene-hydrochlorothiazide (MAXZIDE-25) 37.5-25 MG tablet TAKE 1 TABLET BY MOUTH ONCE DAILY        BP 123/73   Pulse 75   Ht 5\' 10"  (1.778 m)   Wt 206 lb (93.4 kg)   BMI 29.56 kg/m    Physical Exam  Constitutional: He is oriented to person, place, and time. He appears well-developed and well-nourished.  Vital signs have been reviewed and are stable. Gen. appearance the patient is well-developed and well-nourished with normal grooming and hygiene.   Neurological: He is alert and oriented to person, place, and time. Gait abnormal.  Unable to weight-bear without support  Skin: Skin is warm and dry. No erythema.  Psychiatric: He has a normal mood and affect.  Vitals reviewed.     Ortho Exam   Right knee has a palpable defect at the quadriceps tendon with large joint effusion he has flexion no active extension no active straight leg raise knee is otherwise stable strength 0 out of 5 quadricep strength skin is normal pulse and perfusion normal groin lymph nodes negative sensation normal right leg no pathologic reflexes   Left knee limited range of motion valgus deformity lateral tenderness again was scheduled for total knee that will have to be canceled   Arther Abbott, MD 03/18/2018 9:50 AM

## 2018-03-19 NOTE — Op Note (Addendum)
03/19/2018  10:03 AM  PATIENT:  Travis Herrera  62 y.o. male  PRE-OPERATIVE DIAGNOSIS:  quadriceps rupture right knee  POST-OPERATIVE DIAGNOSIS:  quadriceps rupture right knee  PROCEDURE:  Procedure(s): REPAIR QUADRICEP TENDON (Right) [phone]  FINDINGS: INTRA-TENDONOUS AND TENDON BONE RUPTURE RIGHT TENDON  IM[PLANTS ARTHREX 6.5 suture anchor and Arthrex 5.0 suture anchor  SURGEON:  Surgeon(s) and Role:    Carole Civil, MD - Primary  Surgery was done as follows:  Patient was identified in the preop area the surgical site was confirmed as right knee and marked as such.  Chart review was completed.  Patient was taken to the operating suite for general anesthesia.  He was in the supine position a tourniquet was placed on his right thigh.  He was prepped and draped sterilely.  Timeout was completed  The limb was exsanguinated with a 6 inch Esmarch tourniquet was elevated to 300 mmHg.  A midline incision was made over the patella and quadriceps mechanism and taken down to the extensor mechanism.  The tendon was avulsed from the bone as well as intratendinous rupture was found.  The joint was irrigated there were no loose bodies shows hematoma and clot  This was removed  A drill hole was made on each side of the patella using a 2.8 drill bit followed by a 6.5 anchor and a five-point 0 suture anchor.  The sutures were passed through the tendon.  The tendon and bone were brought together with the knee in hyperextension and the sutures were tied.  The tendon was then oversewn with a #5 Ethibond suture.  The retinacular tear which was found on the medial and lateral side of the knee was repaired with interrupted #1 Vicryl suture  The knee was flexed to 90 degrees there is no tension on the repair  The wound was irrigated and then injected with exparel 20 cc on diluted followed by wound closure with interrupted 0 Monocryl suture.  The skin was reapproximated with staples  Sterile  dressing was applied tourniquet was released and the knee immobilizer was reapplied with the knee in extension   PHYSICIAN ASSISTANT:   ASSISTANTS: BETTY ASHLEY   ANESTHESIA:   general and adductor canal block for postoperative anesthesia  EBL:  50 mL   BLOOD ADMINISTERED:none  DRAINS: none   LOCAL MEDICATIONS USED:  EXPAREL 20 CC UNDILUTED   SPECIMEN:  No Specimen  DISPOSITION OF SPECIMEN:  N/A  COUNTS:  YES  TOURNIQUET:   Total Tourniquet Time Documented: Thigh (Right) - 61 minutes Total: Thigh (Right) - 61 minutes   DICTATION: .Viviann Spare Dictation  PLAN OF CARE: Discharge to home after PACU  PATIENT DISPOSITION:  PACU - hemodynamically stable.   Delay start of Pharmacological VTE agent (>24hrs) due to surgical blood loss or risk of bleeding: not applicable    Postoperative plan  Weightbearing as tolerated in a brace  Staples will be removed postop day 14  On postop day 14 the patient can be placed in a hinged knee brace from 0 to 60 degrees to be advanced 20 to 30 degrees every 2 weeks until full range of motion is obtained

## 2018-03-19 NOTE — Transfer of Care (Signed)
Immediate Anesthesia Transfer of Care Note  Patient: Travis Herrera  Procedure(s) Performed: REPAIR QUADRICEP TENDON (Right )  Patient Location: PACU  Anesthesia Type:General  Level of Consciousness: awake, oriented and patient cooperative  Airway & Oxygen Therapy: Patient Spontanous Breathing and Patient connected to nasal cannula oxygen  Post-op Assessment: Report given to RN and Post -op Vital signs reviewed and stable  Post vital signs: Reviewed and stable  Last Vitals:  Vitals Value Taken Time  BP 109/72 03/19/2018 10:15 AM  Temp    Pulse 74 03/19/2018 10:16 AM  Resp 17 03/19/2018 10:16 AM  SpO2 100 % 03/19/2018 10:16 AM  Vitals shown include unvalidated device data.  Last Pain:  Vitals:   03/19/18 0742  TempSrc: Oral      Patients Stated Pain Goal: 10 (82/95/62 1308)  Complications: No apparent anesthesia complications

## 2018-03-19 NOTE — Brief Op Note (Signed)
03/19/2018  10:03 AM  PATIENT:  Travis Herrera  62 y.o. male  PRE-OPERATIVE DIAGNOSIS:  quadriceps rupture right knee  POST-OPERATIVE DIAGNOSIS:  quadriceps rupture right knee  PROCEDURE:  Procedure(s): REPAIR QUADRICEP TENDON (Right)   FINDINGS: INTRA-TENDONOUS AND TENDON BONE RUPTURE RIGHT TENDON  IM[PLANTS ARTHREX 6.5 suture anchor and Arthrex 5.0 suture anchor  SURGEON:  Surgeon(s) and Role:    Carole Civil, MD - Primary  Surgery was done as follows:  Patient was identified in the preop area the surgical site was confirmed as right knee and marked as such.  Chart review was completed.  Patient was taken to the operating suite for general anesthesia.  He was in the supine position a tourniquet was placed on his right thigh.  He was prepped and draped sterilely.  Timeout was completed  The limb was exsanguinated with a 6 inch Esmarch tourniquet was elevated to 300 mmHg.  A midline incision was made over the patella and quadriceps mechanism and taken down to the extensor mechanism.  The tendon was avulsed from the bone as well as intratendinous rupture was found.  The joint was irrigated there were no loose bodies shows hematoma and clot  This was removed  A drill hole was made on each side of the patella using a 2.8 drill bit followed by a 6.5 anchor and a five-point 0 suture anchor.  The sutures were passed through the tendon.  The tendon and bone were brought together with the knee in hyperextension and the sutures were tied.  The tendon was then oversewn with a #5 Ethibond suture.  The retinacular tear which was found on the medial and lateral side of the knee was repaired with interrupted #1 Vicryl suture  The knee was flexed to 90 degrees there is no tension on the repair  The wound was irrigated and then injected with exparel 20 cc on diluted followed by wound closure with interrupted 0 Monocryl suture.  The skin was reapproximated with staples  Sterile  dressing was applied tourniquet was released and the knee immobilizer was reapplied with the knee in extension   PHYSICIAN ASSISTANT:   ASSISTANTS: BETTY ASHLEY   ANESTHESIA:   general and adductor canal block for postoperative anesthesia  EBL:  50 mL   BLOOD ADMINISTERED:none  DRAINS: none   LOCAL MEDICATIONS USED:  EXPAREL 20 CC UNDILUTED   SPECIMEN:  No Specimen  DISPOSITION OF SPECIMEN:  N/A  COUNTS:  YES  TOURNIQUET:   Total Tourniquet Time Documented: Thigh (Right) - 61 minutes Total: Thigh (Right) - 61 minutes   DICTATION: .Viviann Spare Dictation  PLAN OF CARE: Discharge to home after PACU  PATIENT DISPOSITION:  PACU - hemodynamically stable.   Delay start of Pharmacological VTE agent (>24hrs) due to surgical blood loss or risk of bleeding: not applicable    Postoperative plan  Weightbearing as tolerated in a brace  Staples will be removed postop day 14  On postop day 14 the patient can be placed in a hinged knee brace from 0 to 60 degrees to be advanced 20 to 30 degrees every 2 weeks until full range of motion is obtained

## 2018-03-19 NOTE — Anesthesia Procedure Notes (Signed)
Anesthesia Regional Block: Adductor canal block   Pre-Anesthetic Checklist: ,, timeout performed, Correct Patient, Correct Site, Correct Laterality, Correct Procedure, Correct Position, site marked, Risks and benefits discussed, at surgeon's request and post-op pain management  Laterality: Right  Prep: chloraprep       Needles:  Injection technique: Single-shot  Needle Type: Stimulator Needle - 80      Needle Gauge: 22     Additional Needles:   Procedures:,,,, ultrasound used (permanent image in chart),,,,  Narrative:  Start time: 03/19/2018 8:01 AM End time: 03/19/2018 8:09 AM  Performed by: Personally  Anesthesiologist: Nicanor Alcon, MD  Additional Notes: BP cuff, EKG monitors applied. Sedation begun. Adductor canal located with ultrasound guidance. After  Adductor canal located, anesthetic injected incrementally, slowly , and after neg aspirations. Tolerated well.  Medication: 30cc of Ropivicaine 0.5% with 4mg  of decadron

## 2018-03-19 NOTE — Anesthesia Preprocedure Evaluation (Signed)
Anesthesia Evaluation  Patient identified by MRN, date of birth, ID band Patient awake    Reviewed: Allergy & Precautions, H&P , NPO status , Patient's Chart, lab work & pertinent test results, reviewed documented beta blocker date and time   Airway Mallampati: II  TM Distance: >3 FB Neck ROM: full    Dental no notable dental hx.    Pulmonary neg pulmonary ROS,    Pulmonary exam normal breath sounds clear to auscultation       Cardiovascular Exercise Tolerance: Good hypertension, negative cardio ROS   Rhythm:regular Rate:Normal     Neuro/Psych negative neurological ROS  negative psych ROS   GI/Hepatic negative GI ROS, Neg liver ROS,   Endo/Other  negative endocrine ROSdiabetes, Insulin Dependent  Renal/GU Renal diseasenegative Renal ROS  negative genitourinary   Musculoskeletal   Abdominal   Peds  Hematology negative hematology ROS (+)   Anesthesia Other Findings   Reproductive/Obstetrics negative OB ROS                             Anesthesia Physical Anesthesia Plan  ASA: II  Anesthesia Plan: General   Post-op Pain Management:    Induction:   PONV Risk Score and Plan:   Airway Management Planned:   Additional Equipment:   Intra-op Plan:   Post-operative Plan:   Informed Consent: I have reviewed the patients History and Physical, chart, labs and discussed the procedure including the risks, benefits and alternatives for the proposed anesthesia with the patient or authorized representative who has indicated his/her understanding and acceptance.   Dental Advisory Given  Plan Discussed with: CRNA  Anesthesia Plan Comments:         Anesthesia Quick Evaluation

## 2018-03-19 NOTE — Interval H&P Note (Signed)
History and Physical Interval Note:  03/19/2018 8:14 AM  Travis Herrera  has presented today for surgery, with the diagnosis of quadriceps rupture right knee  The various methods of treatment have been discussed with the patient and family. After consideration of risks, benefits and other options for treatment, the patient has consented to  Procedure(s): REPAIR QUADRICEP TENDON (Right) as a surgical intervention .  The patient's history has been reviewed, patient examined, no change in status, stable for surgery.  I have reviewed the patient's chart and labs.  Questions were answered to the patient's satisfaction.     Arther Abbott

## 2018-03-20 ENCOUNTER — Encounter (HOSPITAL_COMMUNITY): Payer: Self-pay | Admitting: Orthopedic Surgery

## 2018-03-20 ENCOUNTER — Telehealth: Payer: Self-pay | Admitting: Orthopedic Surgery

## 2018-03-20 NOTE — Telephone Encounter (Signed)
Continued - I called patient per Dr Ruthe Mannan staff message to schedule appointment for 04/02/18 ("post op staples, hinge brace 0-60") and patient's wife and designated contact, Izora Gala, relays that she has spoken with Dr Aline Brochure about Travis Herrera not feeling well since surgery. States he had a rough night, had been vomiting, which has eased - said medication helped.  Also states Travis Herrera has been very confused, has been crying, and also fearful.  States he does not want to take the Hydrocodone as it makes him feel funny.  Please advise. [phone]

## 2018-03-20 NOTE — Telephone Encounter (Signed)
I Patient

## 2018-03-20 NOTE — ED Provider Notes (Signed)
[facility] EMERGENCY DEPARTMENT Provider Note   CSN: [id number] Arrival date & time: 03/17/18  1848     History   Chief Complaint Chief Complaint  Patient presents with  . Knee Pain    HPI EARNESTINE TUOHEY is a 62 y.o. male.  HPI  With acute onset of right knee pain.  He was playing with his children when he felt "a pop."  In the right knee.  She has significant pain since then.  He is unable to bear weight on it.  No other acute injury.  Past Medical History:  Diagnosis Date  . Diabetes mellitus without complication ([place]) 6237   insulin started in 2012  . Hyperlipemia 2013  . Hypertension 2010    Patient Active Problem List   Diagnosis Date Noted  . Quadriceps tendon rupture, right, initial encounter   . Otitis externa of right ear 02/20/2018  . Annual physical exam 07/26/2016  . Benign hypertension 07/27/2015  . Osteoarthritis of left knee 05/05/2015  . Erectile dysfunction 10/01/2014  . Mixed hyperlipidemia 05/03/2014  . Overweight (BMI 25.0-29.9) 05/03/2014  . Type 2 diabetes mellitus with stage 2 chronic kidney disease, with long-term current use of insulin ([place]) 12/08/2013    Past Surgical History:  Procedure Laterality Date  . CATARACT EXTRACTION W/PHACO  07/29/2012   Procedure: CATARACT EXTRACTION PHACO AND INTRAOCULAR LENS PLACEMENT (IOC);  Surgeon: Tonny Branch, MD;  Location: [facility];  Service: Ophthalmology;  Laterality: Left;  CDE=1.66  . CHOLECYSTECTOMY  2007   [facility]  . EYE SURGERY Left 2013   cataract  . QUADRICEPS TENDON REPAIR Right 03/19/2018   Procedure: REPAIR QUADRICEP TENDON;  Surgeon: Carole Civil, MD;  Location: [facility];  Service: Orthopedics;  Laterality: Right;        Home Medications    Prior to Admission medications   Medication Sig Start Date End Date Taking? Authorizing Provider  amLODipine (NORVASC) 10 MG tablet TAKE 1 TABLET BY MOUTH ONCE DAILY 01/21/18   Fayrene Helper, MD  aspirin EC 81 MG tablet Take 81 mg by  mouth daily.    [provider]  benazepril (LOTENSIN) 20 MG tablet TAKE 1 TABLET BY MOUTH ONCE DAILY 09/26/17   Fayrene Helper, MD  gabapentin (NEURONTIN) 100 MG capsule Take 1 capsule (100 mg total) by mouth 3 (three) times daily. 03/19/18 03/19/19  Carole Civil, MD  glucose blood [facility] VERIO) test strip One touch verio strips- three times daily testing 02/13/18   Fayrene Helper, MD  hydrochlorothiazide (HYDRODIURIL) 25 MG tablet TAKE 1 TABLET BY MOUTH DAILY. 06/30/16   Fayrene Helper, MD  HYDROcodone-acetaminophen (NORCO/VICODIN) 5-325 MG tablet Take 1 tablet by mouth every 4 (four) hours as needed for up to 7 days for moderate pain. 03/19/18 03/26/18  Carole Civil, MD  ibuprofen (ADVIL,MOTRIN) 800 MG tablet Take 1 tablet (800 mg total) by mouth every 8 (eight) hours as needed. 03/19/18   Carole Civil, MD  Insulin Pen Needle 31G X 8 MM MISC 1 each by Does not apply route 2 (two) times daily. 07/04/17   Fayrene Helper, MD  Lancets (FREESTYLE) lancets Use as instructed bid 05/03/16   Cassandria Anger, MD  LANTUS SOLOSTAR 100 UNIT/ML Solostar Pen INJECT 50 UNITS SUBCUTANEOUSLY ONCE DAILY IN THE MORNING 01/29/18   Cassandria Anger, MD  liraglutide (VICTOZA) 18 MG/3ML SOPN INJECT 1.8 MG INTO THE SKIN DAILY AS DIRECTED Patient taking differently: Inject 1.8 mg into the skin  every morning. INJECT 1.8 MG INTO THE SKIN DAILY AS DIRECTED 11/02/17   Cassandria Anger, MD  lovastatin (MEVACOR) 20 MG tablet TAKE 1 TABLET BY MOUTH AT BEDTIME 09/26/17   Fayrene Helper, MD  Omega-3 Fatty Acids (FISH OIL) 1000 MG CAPS Take 1,000 mg by mouth daily.     [provider]  ondansetron (ZOFRAN) 4 MG tablet Take 1 tablet (4 mg total) by mouth every 6 (six) hours. 03/19/18   Carole Civil, MD  [facility] DELICA LANCETS 60V MISC Three times daily testing dx e11.65 02/13/18   Fayrene Helper, MD  sildenafil (VIAGRA) 100 MG tablet Take 1 tablet (100 mg  total) by mouth daily as needed for erectile dysfunction. 01/19/16   Fayrene Helper, MD  triamterene-hydrochlorothiazide (MAXZIDE-25) 37.5-25 MG tablet TAKE 1 TABLET BY MOUTH ONCE DAILY 09/26/17   Fayrene Helper, MD    Family History Family History  Problem Relation Age of Onset  . Cancer Sister   . Early death Father        car accident   . Cancer Brother   . Hypertension Brother   . Diabetes Brother   . Hypertension Brother     Social History Social History   Tobacco Use  . Smoking status: Never Smoker  . Smokeless tobacco: Never Used  Substance Use Topics  . Alcohol use: No  . Drug use: No     Allergies   Patient has no known allergies.   Review of Systems Review of Systems  All systems reviewed and negative, other than as noted in HPI.  Physical Exam Updated Vital Signs BP 138/83 (BP Location: Right Arm)   Pulse 78   Temp 98.5 F (36.9 C) (Oral)   Resp 18   Ht 5\' 10"  (1.778 m)   Wt 93.4 kg (206 lb)   SpO2 100%   BMI 29.56 kg/m   Physical Exam  Constitutional: He appears well-developed and well-nourished. No distress.  HENT:  Head: Normocephalic and atraumatic.  Eyes: Conjunctivae are normal. Right eye exhibits no discharge. Left eye exhibits no discharge.  Neck: Neck supple.  Cardiovascular: Normal rate, regular rhythm and normal heart sounds. Exam reveals no gallop and no friction rub.  No murmur heard. Pulmonary/Chest: Effort normal and breath sounds normal. No respiratory distress.  Abdominal: Soft. He exhibits no distension. There is no tenderness.  Musculoskeletal: He exhibits no edema or tenderness.  Tenderness to palpation just above the right patella.  The patella itself is nontender.  There does appear to be a void/defect at the expected insertion of the quadriceps onto the patella.  Patient cannot extend the right knee and has significant pain when he attempts to do so.  Neurovascular intact.  Neurological: He is alert.  Skin: Skin  is warm and dry.  Psychiatric: He has a normal mood and affect. His behavior is normal. Thought content normal.  Nursing note and vitals reviewed.    ED Treatments / Results  Labs (all labs ordered are listed, but only abnormal results are displayed) Labs Reviewed - No data to display  EKG None  Radiology Dg Chest 2 View  Result Date: 03/19/2018 CLINICAL DATA:  Preop.  Diabetes, hypertension EXAM: CHEST - 2 VIEW COMPARISON:  None. FINDINGS: Heart and mediastinal contours are within normal limits. No focal opacities or effusions. No acute bony abnormality. IMPRESSION: No active cardiopulmonary disease. Electronically Signed   By: Rolm Baptise M.D.   On: 03/19/2018 07:52    Procedures Procedures (  including critical care time)  Medications Ordered in ED Medications  oxyCODONE-acetaminophen (PERCOCET/ROXICET) 5-325 MG per tablet 2 tablet (2 tablets Oral Given 03/17/18 2053)     Initial Impression / Assessment and Plan / ED Course  I have reviewed the triage vital signs and the nursing notes.  Pertinent labs & imaging results that were available during my care of the patient were reviewed by me and considered in my medical decision making (see chart for details).     62 year old male with acute onset of right knee pain.  Exam is consistent with a right quadriceps tendon rupture.  He has seen Dr. Aline Brochure previously with orthopedics.  He is not on call currently, but pt is appropriate for outpatient follow-up in the office this week.  Knee immobilizer.  Crutches.  As needed pain medication.  Final Clinical Impressions(s) / ED Diagnoses   Final diagnoses:  Acute pain of right knee  Rupture of right quadriceps tendon, initial encounter    ED Discharge Orders        Ordered    oxyCODONE-acetaminophen (PERCOCET/ROXICET) 5-325 MG tablet  Every 4 hours PRN,   Status:  Discontinued     03/17/18 2105       Virgel Manifold, MD 03/20/18 1433

## 2018-03-21 NOTE — Telephone Encounter (Signed)
I called, advised  Will call again tomorrow to check on him.

## 2018-03-21 NOTE — Telephone Encounter (Signed)
I donrt have any advise for that other than reassurance ???

## 2018-03-21 NOTE — Telephone Encounter (Signed)
No I don't.   Stop all opioids take tylenol and ibuprofen increase the ice therapy   May be on gabapentin if so stop that

## 2018-03-21 NOTE — Telephone Encounter (Signed)
I called for update  Using Zofran helps with Nausea  He can not take the hydrocodone, it makes him act confused and he can not tolerate Do you have anything else you can recommend ? The ibuprofen is not helping a lot. She states he is tearful, and not himself.

## 2018-03-22 ENCOUNTER — Telehealth: Payer: Self-pay | Admitting: Family Medicine

## 2018-03-22 NOTE — Telephone Encounter (Signed)
Patients wife left voicemail stating patient had an accident on Sunday resulting in a surgery on tues. She says he will be down for around 3 months and just wanted you to know.

## 2018-03-22 NOTE — Telephone Encounter (Signed)
Noted, sorry to hear. 

## 2018-03-25 ENCOUNTER — Other Ambulatory Visit: Payer: Self-pay | Admitting: Family Medicine

## 2018-03-25 NOTE — Telephone Encounter (Signed)
I spoke to patient today, he is better, has d/c the Gabapentin and has been using the Hydrocodone at bedtime only to help with the pain, he sounded better, states he feels better.  Will let us know if anything changes. To you FYI

## 2018-04-02 ENCOUNTER — Encounter: Payer: Self-pay | Admitting: Orthopedic Surgery

## 2018-04-02 ENCOUNTER — Ambulatory Visit (INDEPENDENT_AMBULATORY_CARE_PROVIDER_SITE_OTHER): Payer: Self-pay | Admitting: Orthopedic Surgery

## 2018-04-02 VITALS — BP 119/77 | HR 87 | Ht 70.0 in | Wt 206.0 lb

## 2018-04-02 DIAGNOSIS — S76111D Strain of right quadriceps muscle, fascia and tendon, subsequent encounter: Secondary | ICD-10-CM | POA: Diagnosis not present

## 2018-04-02 NOTE — Progress Notes (Signed)
Postop visit  Postop visit #1 postop day 14 open quadriceps tendon repair right knee  Current pain medication hydrocodone and ibuprofen  Wound check normal no erythema staples were removed  Patient placed in brace 0-60 locked in extension for weightbearing  Return 2 weeks advance brace another 30 degrees  Encounter Diagnosis  Name Primary?  . Quadriceps tendon rupture, right, subsequent encounter s/p repair 03/19/18 Yes

## 2018-04-05 ENCOUNTER — Other Ambulatory Visit: Payer: Self-pay | Admitting: Orthopedic Surgery

## 2018-04-05 MED ORDER — HYDROCODONE-ACETAMINOPHEN 5-325 MG PO TABS: 1.0000 | ORAL_TABLET | ORAL | 0 refills | Status: DC | PRN

## 2018-04-05 NOTE — Telephone Encounter (Signed)
Hydrocodone-Actaminophen  5/325 mg     PATIENT USES [facility]

## 2018-04-17 ENCOUNTER — Encounter: Payer: Self-pay | Admitting: Orthopedic Surgery

## 2018-04-17 ENCOUNTER — Ambulatory Visit (INDEPENDENT_AMBULATORY_CARE_PROVIDER_SITE_OTHER): Payer: PPO | Admitting: Orthopedic Surgery

## 2018-04-17 VITALS — BP 118/74 | HR 77 | Ht 70.0 in | Wt 206.0 lb

## 2018-04-17 DIAGNOSIS — S76111D Strain of right quadriceps muscle, fascia and tendon, subsequent encounter: Secondary | ICD-10-CM

## 2018-04-17 MED ORDER — HYDROCODONE-ACETAMINOPHEN 5-325 MG PO TABS
1.0000 | ORAL_TABLET | Freq: Four times a day (QID) | ORAL | 0 refills | Status: DC | PRN
Start: 2018-04-17 — End: 2018-06-12

## 2018-04-17 NOTE — Progress Notes (Addendum)
Postop visit  Chief Complaint  Patient presents with  . Post-op Follow-up    right quad tendon repair 03/19/18 has swelling right kene      Postop visit #1 postop day 29   open quadriceps tendon repair right knee  Current pain medication hydrocodone and ibuprofen  Wound check normal no erythema staples were removed  Patient placed in brace 0-60 locked in extension for weightbearing  Return 2 weeks advance brace another 30 degrees  Encounter Diagnosis  Name Primary?  . Quadriceps tendon rupture, right, subsequent encounter s/p repair 03/19/18 Yes   Surgical site clean dry and intact range of motion 5-60  Brace at 60  Hip advance brace to 90  Weight-bear as tolerated  Heel slides 20 - 3 times a day and passive knee extension 3 times a day for 30 minutes Fill Date ID Written Drug Qty Days Prescriber Rx # Pharmacy Refill Daily Dose * [place]ym[place] Type PMP 04/05/2018 2 04/05/2018 Hydrocodone-Acetamin 5-325 Mg  [address] [id number] [id number] 0 28.00 MME Medicare [place] 03/19/2018 2 03/19/2018 Hydrocodone-Acetamin 5-325 Mg  [address] [id number] [facility] ([id number]) 0 30.00 MME Medicare [place] 03/18/2018 1 03/17/2018 Oxycodone-Acetaminophen 5-325  15 [address] [place] [id number] [facility] ([id number]) 0 56.25 MME Comm Ins [place]  Encounter Diagnosis  Name Primary?  . Quadriceps tendon rupture, right, subsequent encounter s/p repair 03/19/18 Yes   Meds ordered this encounter  Medications  . HYDROcodone-acetaminophen (NORCO/VICODIN) 5-325 MG tablet    Sig: Take 1 tablet by mouth every 6 (six) hours as needed for moderate pain.    Dispense:  28 tablet    Refill:  0

## 2018-04-17 NOTE — Addendum Note (Signed)
Addended by: Carole Civil on: 04/17/2018 10:38 AM   Modules accepted: Orders

## 2018-05-01 ENCOUNTER — Ambulatory Visit (INDEPENDENT_AMBULATORY_CARE_PROVIDER_SITE_OTHER): Payer: PPO | Admitting: Orthopedic Surgery

## 2018-05-01 VITALS — BP 102/72 | HR 79

## 2018-05-01 DIAGNOSIS — Z4889 Encounter for other specified surgical aftercare: Secondary | ICD-10-CM

## 2018-05-01 DIAGNOSIS — S76111D Strain of right quadriceps muscle, fascia and tendon, subsequent encounter: Secondary | ICD-10-CM

## 2018-05-01 NOTE — Progress Notes (Signed)
Encounter Diagnoses  Name Primary?  . Quadriceps tendon rupture, right, subsequent encounter s/p repair 03/19/18 Yes  . Aftercare following surgery    Chief complaint follow-up right knee postop day 43   Postop day 43 active range of motion is 0-65 with intact straight leg raise minimal extensor lag  Brace 0-1 20 unlocked weight-bear as tolerated in brace continue exercises follow-up in 3 weeks

## 2018-05-24 ENCOUNTER — Ambulatory Visit (INDEPENDENT_AMBULATORY_CARE_PROVIDER_SITE_OTHER): Payer: PPO | Admitting: Orthopedic Surgery

## 2018-05-24 ENCOUNTER — Encounter: Payer: Self-pay | Admitting: "Endocrinology

## 2018-05-24 VITALS — BP 135/87 | HR 87 | Ht 70.0 in | Wt 206.0 lb

## 2018-05-24 DIAGNOSIS — S76111D Strain of right quadriceps muscle, fascia and tendon, subsequent encounter: Secondary | ICD-10-CM

## 2018-05-24 NOTE — Progress Notes (Signed)
Chief Complaint  Patient presents with  . Follow-up    Recheck on right quad repair, DOS 03-19-18.   66 days postop week #9 right quad repair patient currently ambulatory with a brace weight-bear as tolerated comes in for brace check and advancement  Her range of motion 0 to 90 degrees full straight leg raise ambulatory weightbearing as tolerated with crutches  Encounter Diagnosis  Name Primary?  . Quadriceps tendon rupture, right, subsequent encounter s/p repair 03/19/18 Yes    Follow-up 3 weeks

## 2018-05-24 NOTE — Patient Instructions (Signed)
Continue range of motion exercises

## 2018-06-02 ENCOUNTER — Other Ambulatory Visit: Payer: Self-pay | Admitting: Orthopedic Surgery

## 2018-06-03 DIAGNOSIS — Z794 Long term (current) use of insulin: Secondary | ICD-10-CM | POA: Diagnosis not present

## 2018-06-03 DIAGNOSIS — N182 Chronic kidney disease, stage 2 (mild): Secondary | ICD-10-CM | POA: Diagnosis not present

## 2018-06-03 DIAGNOSIS — E1122 Type 2 diabetes mellitus with diabetic chronic kidney disease: Secondary | ICD-10-CM | POA: Diagnosis not present

## 2018-06-04 ENCOUNTER — Inpatient Hospital Stay: Admit: 2018-06-04 | Payer: PPO | Admitting: Orthopedic Surgery

## 2018-06-04 LAB — COMPLETE METABOLIC PANEL WITH GFR
AG RATIO: 1.6 (calc) (ref 1.0–2.5)
ALBUMIN MSPROF: 4.3 g/dL (ref 3.6–5.1)
ALKALINE PHOSPHATASE (APISO): 88 U/L (ref 40–115)
ALT: 10 U/L (ref 9–46)
AST: 11 U/L (ref 10–35)
BUN / CREAT RATIO: 16 (calc) (ref 6–22)
BUN: 24 mg/dL (ref 7–25)
CALCIUM: 9.4 mg/dL (ref 8.6–10.3)
CO2: 28 mmol/L (ref 20–32)
Chloride: 105 mmol/L (ref 98–110)
Creat: 1.53 mg/dL — ABNORMAL HIGH (ref 0.70–1.33)
GFR, Est African American: 57 mL/min/{1.73_m2} — ABNORMAL LOW (ref >=60)
GFR, Est Non African American: 49 mL/min/{1.73_m2} — ABNORMAL LOW (ref >=60)
GLOBULIN: 2.7 g/dL (ref 1.9–3.7)
Glucose, Bld: 103 mg/dL — ABNORMAL HIGH (ref 65–99)
POTASSIUM: 4.4 mmol/L (ref 3.5–5.3)
SODIUM: 141 mmol/L (ref 135–146)
Total Bilirubin: 0.3 mg/dL (ref 0.2–1.2)
Total Protein: 7 g/dL (ref 6.1–8.1)

## 2018-06-04 LAB — HEMOGLOBIN A1C
EAG (MMOL/L): 7.4 (calc)
Hgb A1c MFr Bld: 6.3 % of total Hgb — ABNORMAL HIGH (ref <5.7)
Mean Plasma Glucose: 134 (calc)

## 2018-06-04 SURGERY — ARTHROPLASTY, KNEE, TOTAL
Anesthesia: Choice | Laterality: Left

## 2018-06-07 ENCOUNTER — Ambulatory Visit: Payer: PPO | Admitting: "Endocrinology

## 2018-06-12 ENCOUNTER — Encounter: Payer: Self-pay | Admitting: "Endocrinology

## 2018-06-12 ENCOUNTER — Ambulatory Visit (INDEPENDENT_AMBULATORY_CARE_PROVIDER_SITE_OTHER): Payer: PPO | Admitting: "Endocrinology

## 2018-06-12 VITALS — BP 129/78 | HR 78 | Ht 70.0 in | Wt 212.0 lb

## 2018-06-12 DIAGNOSIS — E1122 Type 2 diabetes mellitus with diabetic chronic kidney disease: Secondary | ICD-10-CM

## 2018-06-12 DIAGNOSIS — I10 Essential (primary) hypertension: Secondary | ICD-10-CM

## 2018-06-12 DIAGNOSIS — E782 Mixed hyperlipidemia: Secondary | ICD-10-CM

## 2018-06-12 DIAGNOSIS — Z794 Long term (current) use of insulin: Secondary | ICD-10-CM

## 2018-06-12 DIAGNOSIS — N182 Chronic kidney disease, stage 2 (mild): Secondary | ICD-10-CM | POA: Diagnosis not present

## 2018-06-12 NOTE — Patient Instructions (Signed)

## 2018-06-12 NOTE — Progress Notes (Signed)
Endocrinology follow-up note   Subjective:    Patient ID: Travis Herrera, male    DOB: [birth year],    Past Medical History:  Diagnosis Date  . Diabetes mellitus without complication ([place]) 3329   insulin started in 2012  . Hyperlipemia 2013  . Hypertension 2010   Past Surgical History:  Procedure Laterality Date  . CATARACT EXTRACTION W/PHACO  07/29/2012   Procedure: CATARACT EXTRACTION PHACO AND INTRAOCULAR LENS PLACEMENT (IOC);  Surgeon: Tonny Branch, MD;  Location: [facility];  Service: Ophthalmology;  Laterality: Left;  CDE=1.66  . CHOLECYSTECTOMY  2007   [facility]  . EYE SURGERY Left 2013   cataract  . QUADRICEPS TENDON REPAIR Right 03/19/2018   Procedure: REPAIR QUADRICEP TENDON;  Surgeon: Carole Civil, MD;  Location: [facility];  Service: Orthopedics;  Laterality: Right;   Social History   Socioeconomic History  . Marital status: Married    Spouse name: Not on file  . Number of children: Not on file  . Years of education: Not on file  . Highest education level: Not on file  Occupational History  . Not on file  Social Needs  . Financial resource strain: Not on file  . Food insecurity:    Worry: Not on file    Inability: Not on file  . Transportation needs:    Medical: Not on file    Non-medical: Not on file  Tobacco Use  . Smoking status: Never Smoker  . Smokeless tobacco: Never Used  Substance and Sexual Activity  . Alcohol use: No  . Drug use: No  . Sexual activity: Yes    Birth control/protection: None  Lifestyle  . Physical activity:    Days per week: Not on file    Minutes per session: Not on file  . Stress: Not on file  Relationships  . Social connections:    Talks on phone: Not on file    Gets together: Not on file    Attends religious service: Not on file    Active member of club or organization: Not on file    Attends meetings of clubs or organizations: Not on file    Relationship status: Not on file  Other Topics Concern  . Not on file   Social History Narrative  . Not on file   Outpatient Encounter Medications as of 06/12/2018  Medication Sig  . amLODipine (NORVASC) 10 MG tablet TAKE 1 TABLET BY MOUTH ONCE DAILY  . aspirin EC 81 MG tablet Take 81 mg by mouth daily.  . benazepril (LOTENSIN) 20 MG tablet TAKE 1 TABLET BY MOUTH ONCE DAILY  . glucose blood (ONETOUCH VERIO) test strip One touch verio strips- three times daily testing  . hydrochlorothiazide (HYDRODIURIL) 25 MG tablet TAKE 1 TABLET BY MOUTH DAILY.  Marland Kitchen ibuprofen (ADVIL,MOTRIN) 800 MG tablet TAKE 1 TABLET BY MOUTH EVERY 8 HOURS AS NEEDED  . Insulin Pen Needle 31G X 8 MM MISC 1 each by Does not apply route 2 (two) times daily.  . Lancets (FREESTYLE) lancets Use as instructed bid  . LANTUS SOLOSTAR 100 UNIT/ML Solostar Pen INJECT 50 UNITS SUBCUTANEOUSLY ONCE DAILY IN THE MORNING  . liraglutide (VICTOZA) 18 MG/3ML SOPN INJECT 1.8 MG INTO THE SKIN DAILY AS DIRECTED (Patient taking differently: Inject 1.8 mg into the skin every morning. INJECT 1.8 MG INTO THE SKIN DAILY AS DIRECTED)  . lovastatin (MEVACOR) 20 MG tablet TAKE 1 TABLET BY MOUTH AT BEDTIME  . Omega-3 Fatty Acids (FISH OIL) 1000  MG CAPS Take 1,000 mg by mouth daily.   . ondansetron (ZOFRAN) 4 MG tablet Take 1 tablet (4 mg total) by mouth every 6 (six) hours.  Glory Rosebush DELICA LANCETS 53I MISC Three times daily testing dx e11.65  . sildenafil (VIAGRA) 100 MG tablet Take 1 tablet (100 mg total) by mouth daily as needed for erectile dysfunction.  . triamterene-hydrochlorothiazide (MAXZIDE-25) 37.5-25 MG tablet TAKE 1 TABLET BY MOUTH ONCE DAILY  . [DISCONTINUED] HYDROcodone-acetaminophen (NORCO/VICODIN) 5-325 MG tablet Take 1 tablet by mouth every 6 (six) hours as needed for moderate pain.   No facility-administered encounter medications on file as of 06/12/2018.    ALLERGIES: No Known Allergies VACCINATION STATUS: Immunization History  Administered Date(s) Administered  . Influenza Split 07/17/2014  .  Influenza,inj,Quad PF,6+ Mos 08/16/2015, 07/26/2016, 08/02/2017    Diabetes  He presents for his follow-up diabetic visit. He has type 2 diabetes mellitus. Onset time: He was diagnosed at approximate age of 42 years. His disease course has been improving. There are no hypoglycemic associated symptoms. Pertinent negatives for hypoglycemia include no confusion, headaches, pallor or seizures. There are no diabetic associated symptoms. Pertinent negatives for diabetes include no chest pain, no fatigue, no polydipsia, no polyphagia, no polyuria and no weakness. There are no hypoglycemic complications. Symptoms are improving. Diabetic complications include nephropathy. Risk factors for coronary artery disease include diabetes mellitus, dyslipidemia, hypertension, family history and male sex. Current diabetic treatment includes insulin injections and oral agent (monotherapy). He is compliant with treatment most of the time. His weight is decreasing steadily. He is following a generally unhealthy diet. He participates in exercise three times a week. His home blood glucose trend is decreasing steadily. His breakfast blood glucose range is generally 140-180 mg/dl. His dinner blood glucose range is generally 140-180 mg/dl. His overall blood glucose range is 140-180 mg/dl. An ACE inhibitor/angiotensin II receptor blocker is being taken. Eye exam is current.  Hypertension  This is a chronic problem. The current episode started more than 1 year ago. The problem has been gradually improving since onset. The problem is controlled. Pertinent negatives include no chest pain, headaches, neck pain, palpitations or shortness of breath. Risk factors for coronary artery disease include dyslipidemia, diabetes mellitus and male gender. Past treatments include ACE inhibitors. There are no compliance problems.   Hyperlipidemia  This is a chronic problem. The current episode started more than 1 year ago. Exacerbating diseases include  diabetes. Pertinent negatives include no chest pain, myalgias or shortness of breath. Current antihyperlipidemic treatment includes statins. Risk factors for coronary artery disease include dyslipidemia, diabetes mellitus, hypertension and male sex.    Review of Systems  Constitutional: Negative for fatigue and unexpected weight change.  HENT: Negative for dental problem, mouth sores and trouble swallowing.   Eyes: Negative for visual disturbance.  Respiratory: Negative for cough, choking, chest tightness, shortness of breath and wheezing.   Cardiovascular: Negative for chest pain, palpitations and leg swelling.  Gastrointestinal: Negative for abdominal distention, abdominal pain, constipation, diarrhea, nausea and vomiting.  Endocrine: Negative for polydipsia, polyphagia and polyuria.  Genitourinary: Negative for dysuria, flank pain, hematuria and urgency.  Musculoskeletal: Negative for back pain, gait problem, myalgias and neck pain.  Skin: Negative for pallor, rash and wound.  Neurological: Negative for seizures, syncope, weakness, numbness and headaches.  Psychiatric/Behavioral: Negative.  Negative for confusion and dysphoric mood.    Objective:    BP 129/78   Pulse 78   Ht 5\' 10"  (1.778 m)   Wt 212  lb (96.2 kg) Comment: Pt wearing knee brace  BMI 30.42 kg/m   Wt Readings from Last 3 Encounters:  06/12/18 212 lb (96.2 kg)  05/24/18 206 lb (93.4 kg)  04/17/18 206 lb (93.4 kg)    Physical Exam  Constitutional: He is oriented to person, place, and time. He appears well-developed and well-nourished. He is cooperative. No distress.  HENT:  Head: Normocephalic and atraumatic.  Eyes: EOM are normal.  Neck: Normal range of motion. Neck supple. No tracheal deviation present. No thyromegaly present.  Cardiovascular: Normal rate, S1 normal, S2 normal and normal heart sounds. Exam reveals no gallop.  No murmur heard. Pulses:      Dorsalis pedis pulses are 1+ on the right side, and 1+  on the left side.       Posterior tibial pulses are 1+ on the right side, and 1+ on the left side.  Pulmonary/Chest: Effort normal. No respiratory distress. He has no wheezes.  Abdominal: He exhibits no distension. There is no tenderness. There is no guarding and no CVA tenderness.  Musculoskeletal: He exhibits no edema.       Right shoulder: He exhibits no swelling and no deformity.  He recently had a fall accident which resulted in right quadriceps rupture requiring surgical repair,  while he was waiting for left knee replacement.  Neurological: He is alert and oriented to person, place, and time. He has normal strength. No cranial nerve deficit or sensory deficit. Gait normal.  Skin: Skin is warm and dry. No rash noted. No cyanosis. Nails show no clubbing.  Psychiatric: He has a normal mood and affect. His speech is normal. Judgment normal. Cognition and memory are normal.    Results for orders placed or performed during the hospital encounter of 03/19/18  Glucose, capillary  Result Value Ref Range   Glucose-Capillary 114 (H) 65 - 99 mg/dL  Glucose, capillary  Result Value Ref Range   Glucose-Capillary 113 (H) 65 - 99 mg/dL   Diabetic Labs (most recent): Lab Results  Component Value Date   HGBA1C 6.3 (H) 06/03/2018   HGBA1C 7.0 (H) 02/26/2018   HGBA1C 7.0 (H) 10/24/2017   Lipid Panel     Component Value Date/Time   CHOL 131 02/26/2018 0747   TRIG 96 02/26/2018 0747   HDL 33 (L) 02/26/2018 0747   CHOLHDL 4.0 02/26/2018 0747   VLDL 12 01/16/2017 0940   LDLCALC 80 02/26/2018 0747      Assessment & Plan:   1. diabetes mellitus type 2 without complications, unspecified long term insulin use status ([place]) He returns with controlled glycemic profile with A1c of 6.3%, overall improving from 7.8%.      Glucose logs and insulin administration records pertaining to this visit,  to be scanned into patient's records.  Recent labs reviewed, showing mild renal insufficiency. -  Patient remains at a high risk for more acute and chronic complications of diabetes which include CAD, CVA, CKD, retinopathy, and neuropathy. These are all discussed in detail with the patient.  - I have re-counseled the patient on diet management by adopting a carbohydrate restricted / protein rich  Diet. - He is advised to stick to a routine mealtimes to eat 3 meals  a day and avoid unnecessary snacks ( to snack only to correct hypoglycemia).  -  Suggestion is made for him to avoid simple carbohydrates  from his diet including Cakes, Sweet Desserts / Pastries, Ice Cream, Soda (diet and regular), Sweet Tea, Candies, Chips, Cookies, Store Bought  Juices, Alcohol in Excess of  1-2 drinks a day, Artificial Sweeteners, and "Sugar-free" Products. This will help patient to have stable blood glucose profile and potentially avoid unintended weight gain.   - I have approached patient with the following individualized plan to manage diabetes and patient agrees.   -Based on his current response to treatment, he will not require prandial insulin for now.    - I advised him to continue Lantus 50 units daily in the morning with breakfast,  associated with strict monitoring of blood glucose 2 times a day-daily before breakfast and at bedtime. -He is warned not to take insulin without proper monitoring per orders. -Patient is encouraged to call clinic for blood glucose levels less than 70 or above 300 mg /dl. - I will continue  VICTOZA 1.8  mg subcutaneous daily before supper, therapeutically suitable for patient. -His renal function is not improving, mainly due to interval utilization of large dose of Motrin related to his injury and surgery to the right lower extremity.  He expresses willingness to come off of Motrin and use as needed Tylenol instead.  If his renal function continues to worsen, he may need nephrology evaluation.    - He has used large dose of NSAIDs for arthritis in the past.  He is advised to  avoid both over-the-counter medications as well as to maintain adequate hydration.  - Patient specific target  for A1c; LDL, HDL, Triglycerides, and  Waist Circumference were discussed in detail.  2) BP/HTN: His blood pressure is controlled to target.    He is advised to continue his current blood pressure medications including benazepril 20 mg p.o. daily.    3) Lipids/HPL: Recent lipid panel showed controlled LDL at 76.  He will continue to benefit from statin therapy, advised to continue lovastatin 20 mg p.o. nightly.   4)  Weight/Diet: CDE consult in progress, exercise, and carbohydrates information provided.  5) Chronic Care/Health Maintenance:  -Patient  on ACEI and Statin medications and encouraged to continue to follow up with Ophthalmology, Podiatrist at least yearly or according to recommendations, and advised to stay away from smoking. I have recommended yearly flu vaccine and pneumonia vaccination at least every 5 years; moderate intensity exercise for up to 150 minutes weekly; and  sleep for at least 7 hours a day.  I advised patient to maintain close follow up with his PCP for primary care needs. - Time spent with the patient: 25 min, of which >50% was spent in reviewing his blood glucose logs , discussing his hypo- and hyper-glycemic episodes, reviewing his current and  previous labs and insulin doses and developing a plan to avoid hypo- and hyper-glycemia. Please refer to Patient Instructions for Blood Glucose Monitoring and Insulin/Medications Dosing Guide"  in media tab for additional information. Travis Herrera participated in the discussions, expressed understanding, and voiced agreement with the above plans.  All questions were answered to his satisfaction. he is encouraged to contact clinic should he have any questions or concerns prior to his return visit.   Follow up plan: Return in about 6 months (around 12/13/2018) for Follow up with Pre-visit Labs, Meter, and  Logs.  Glade Lloyd, MD Phone: [phone]  Fax: [phone]  This note was partially dictated with voice recognition software. Similar sounding words can be transcribed inadequately or may not  be corrected upon review.  06/12/2018, 12:07 PM

## 2018-06-14 ENCOUNTER — Encounter: Payer: Self-pay | Admitting: Orthopedic Surgery

## 2018-06-14 ENCOUNTER — Ambulatory Visit (INDEPENDENT_AMBULATORY_CARE_PROVIDER_SITE_OTHER): Payer: PPO | Admitting: Orthopedic Surgery

## 2018-06-14 VITALS — BP 138/73 | HR 76 | Ht 70.0 in | Wt 212.0 lb

## 2018-06-14 DIAGNOSIS — S76111D Strain of right quadriceps muscle, fascia and tendon, subsequent encounter: Secondary | ICD-10-CM

## 2018-06-14 NOTE — Progress Notes (Signed)
No chief complaint on file.  Encounter Diagnosis  Name Primary?  . Quadriceps tendon rupture, right, subsequent encounter s/p repair 03/19/18 Yes    POD # 39  62 year old male week #12 status post quadriceps tendon rupture and repair right knee  Patient is ambulatory with a T scope brace his range of motion is 0 to 85 degrees  Recommend physical therapy to improve range of motion.  Okay to go to a cane and I placed him in an economy hinged brace

## 2018-06-14 NOTE — Addendum Note (Signed)
Addended byCandice Camp on: 06/14/2018 09:35 AM   Modules accepted: Orders

## 2018-06-19 ENCOUNTER — Ambulatory Visit (HOSPITAL_COMMUNITY): Payer: PPO | Attending: Orthopedic Surgery | Admitting: Physical Therapy

## 2018-06-19 ENCOUNTER — Encounter (HOSPITAL_COMMUNITY): Payer: Self-pay | Admitting: Physical Therapy

## 2018-06-19 ENCOUNTER — Other Ambulatory Visit: Payer: Self-pay

## 2018-06-19 DIAGNOSIS — R262 Difficulty in walking, not elsewhere classified: Secondary | ICD-10-CM | POA: Diagnosis not present

## 2018-06-19 DIAGNOSIS — M25561 Pain in right knee: Secondary | ICD-10-CM | POA: Diagnosis not present

## 2018-06-19 DIAGNOSIS — M6281 Muscle weakness (generalized): Secondary | ICD-10-CM

## 2018-06-19 DIAGNOSIS — M25661 Stiffness of right knee, not elsewhere classified: Secondary | ICD-10-CM | POA: Diagnosis not present

## 2018-06-19 NOTE — Patient Instructions (Addendum)
Knee Extension (Sitting)    Place _2___ pound weight on right ankle and straighten knee fully, lower slowly. Repeat _10___ times per set. Do _1___ sets per session. Do ___3_ sessions per day.  http://orth.[email or web address].us/732   Copyright  VHI. All rights reserved.  Strengthening: Quadriceps Set    Tighten muscles on top of thighs by pushing knees down into surface. Hold _5___ seconds. Repeat __10__ times per set. Do __1__ sets per session. Do __3__ sessions per day.  http://orth.[email or web address].us/602   Copyright  VHI. All rights reserved.  Dorsiflexion: Self-Mobilization (Sitting)    Feet flat, other foot forward, slide right  foot back until gentle stretch is felt. Keep entire foot on floor. Hold __5__ seconds. Relax. Repeat 10____ times per set. Do _1___ sets per session. Do ____2 sessions per day.  http://orth.[email or web address].us/82   Copyright  VHI. All rights reserved.  Self-Mobilization: Heel Slide (Supine)    Slide right heel toward buttocks until a gentle stretch is felt. Hold ___5_ seconds. Relax. Repeat __10__ times per set. Do __1__ sets per session. Do __2__ sessions per day.  http://orth.[email or web address].us/710   Copyright  VHI. All rights reserved.  Strengthening: Straight Leg Raise (Phase 1)    Tighten muscles on front of right thigh, then lift leg __18__ inches from surface, keeping knee locked.  Repeat _10___ times per set. Do 1____ sets per session. Do _2___ sessions per day.  http://orth.exer.us/614   Copyright  VHI. All rights reserved.  Heel Raise: Bilateral (Standing)    Rise on balls of feet. Repeat _101___ times per set. Do __1__ sets per session. Do 2____ sessions per day.  http://[email or web address].[email or web address]   Copyright  VHI. All rights reserved.

## 2018-06-19 NOTE — Therapy (Addendum)
[place], [place], [address] Phone: [phone]   Fax:  [phone]  Physical Therapy Evaluation  Patient Details  Name: Travis Herrera MRN: [id number] Date of Birth: 07/03/1956 Referring Provider: Arther Abbott   Encounter Date: 06/19/2018  PT End of Session - 06/19/18 0939    Visit Number  1    Number of Visits  16    Date for PT Re-Evaluation  08/18/18    Authorization - Visit Number  1    Authorization - Number of Visits  16    PT Start Time  1093    PT Stop Time  0935    PT Time Calculation (min)  40 min    Activity Tolerance  Patient tolerated treatment well    Behavior During Therapy  [facility] for tasks assessed/performed       Past Medical History:  Diagnosis Date  . Diabetes mellitus without complication ([place]) 2355   insulin started in 2012  . Hyperlipemia 2013  . Hypertension 2010    Past Surgical History:  Procedure Laterality Date  . CATARACT EXTRACTION W/PHACO  07/29/2012   Procedure: CATARACT EXTRACTION PHACO AND INTRAOCULAR LENS PLACEMENT (IOC);  Surgeon: Tonny Branch, MD;  Location: [facility];  Service: Ophthalmology;  Laterality: Left;  CDE=1.66  . CHOLECYSTECTOMY  2007   [facility]  . EYE SURGERY Left 2013   cataract  . QUADRICEPS TENDON REPAIR Right 03/19/2018   Procedure: REPAIR QUADRICEP TENDON;  Surgeon: Carole Civil, MD;  Location: [facility];  Service: Orthopedics;  Laterality: Right;    There were no vitals filed for this visit.   Subjective Assessment - 06/19/18 0917    Subjective  Travis Herrera states that he fell and ruptured his right quadricep.  His repair was completed on 03/29/2018.  He states that he was going to have a TKR on his left knee this year; therefore both knees are painful.    HE is now being referred to skilled therapy to increase his motion.      Pertinent History  OA,HTN, DM    How long can you sit comfortably?  no problem     How long can you stand  comfortably?  10-15 minutes    How long can you walk comfortably?  walks with a cane able to walk for 10-15 minutes     Patient Stated Goals  walk without a cane,  go up and down steps reciprocally, be able to walk and stand longer.      Currently in Pain?  No/denies   only when exercising.  Pain level goes up to a 6/10    Pain Location  Knee    Pain Orientation  Right    Pain Descriptors / Indicators  Aching    Pain Type  Acute pain    Pain Onset  More than a month ago    Pain Frequency  Intermittent    Aggravating Factors   bending     Pain Relieving Factors  rest     Effect of Pain on Daily Activities  limits    Multiple Pain Sites  Yes    Pain Score  --   Goes up to a 8/10 with WB    Pain Location  Knee    Pain Orientation  Left    Pain Descriptors / Indicators  Aching    Pain Type  Chronic pain    Pain Onset  More than a month ago  Pain Frequency  Constant    Aggravating Factors   walking    Pain Relieving Factors  rest    Effect of Pain on Daily Activities  limits         [facility] PT Assessment - 06/19/18 0001      Assessment   Medical Diagnosis  ruptured Rt quadricep    Referring Provider  Arther Abbott    Onset Date/Surgical Date  03/29/18    Prior Therapy  none      Precautions   Precautions  Knee    Required Braces or Orthoses  Other Brace/Splint    Other Brace/Splint  B knee brace      Restrictions   Weight Bearing Restrictions  No   WBAT     Balance Screen   Has the patient fallen in the past 6 months  Yes    How many times?  1    Has the patient had a decrease in activity level because of a fear of falling?   No    Is the patient reluctant to leave their home because of a fear of falling?   No      Home Film/video editor residence      Prior Function   Level of Independence  Independent    Vocation  Retired    Leisure  playing with children, going to the gym      Cognition   Overall Cognitive Status  Within  Functional Limits for tasks assessed      Observation/Other Assessments   Focus on Therapeutic Outcomes (FOTO)   36      Observation/Other Assessments-Edema    Edema  --   increased edema in RT knee; atrophy along medial aspect      Functional Tests   Functional tests  Single leg stance      Single Leg Stance   Comments  5 seconds B       ROM / Strength   AROM / PROM / Strength  AROM;Strength      AROM   AROM Assessment Site  Knee    Right/Left Knee  Right    8-90     Strength   Strength Assessment Site  Hip;Knee;Ankle    Right/Left Hip  Right;Left    Right Hip Flexion  4/5    Right Hip Extension  3+/5    Right Hip ABduction  5/5    Left Hip Flexion  3/5    Left Hip Extension  3-/5    Left Hip ABduction  5/5    Right/Left Knee  Right;Left    Right Knee Flexion  3+/5    Right Knee Extension  3+/5    Left Knee Flexion  3+/5    Left Knee Extension  3+/5    Right/Left Ankle  Right;Left    Right Ankle Dorsiflexion  5/5    Left Ankle Dorsiflexion  5/5      Ambulation/Gait   Ambulation Distance (Feet)  414 Feet    Assistive device  Straight cane    Gait Pattern  Step-through pattern    Gait Comments  3'                Objective measurements completed on examination: See above findings.      [facility] Adult PT Treatment/Exercise - 06/19/18 0001      Exercises   Exercises  Knee/Hip      Knee/Hip Exercises: Standing   Heel Raises  Both;10  reps      Knee/Hip Exercises: Seated   Long Arc Quad  Right;10 reps    Heel Slides  Right;5 reps      Knee/Hip Exercises: Supine   Quad Sets  Both;10 reps    Heel Slides  Right;10 reps    Straight Leg Raises  Both;5 reps             PT Education - 06/19/18 0939    Education Details  HEP    Person(s) Educated  Patient    Methods  Explanation    Comprehension  Verbalized understanding;Returned demonstration       PT Short Term Goals - 06/19/18 1210      PT SHORT TERM GOAL #1   Title  PT pain in right  knee to be no greater than a 4/10 to allow pt to stand for 30 minutes to be able to complete household duties     Time  4    Period  Weeks    Status  New    Target Date  07/17/18      PT SHORT TERM GOAL #2   Title  Pt strength in both knees to improve one grade to allow pt to go up and down steps in a reciprocal manner    Time  4    Period  Weeks    Status  New      PT SHORT TERM GOAL #3   Title  Pt to be able to bend his right knee to 100 degrees to allow him to complete a functional squat to pick items off the ground     Time  4    Period  Weeks    Status  New        PT Long Term Goals - 06/19/18 1213      PT LONG TERM GOAL #1   Title  PT pain in his right knee to be no greater than a 2/10 to allow pt to stand for 45 minutes to perform church duties.     Time  8    Period  Weeks    Status  New      PT LONG TERM GOAL #2   Title  Pt Rt knee ROM to be to 120 degrees to allow him to don and doff his own shoes.     Time  8    Period  Weeks    Status  New      PT LONG TERM GOAL #3   Title  PT strength in both LE to be improved to a 4+/5 to allow pt to rise up from a low lying couch with ease.     Time  8    Period  Weeks    Status  New      PT LONG TERM GOAL #4   Title  Pt to be able to walk for over an hour to return to working out in the gym.     Time  8    Period  Weeks      PT LONG TERM GOAL #5   Title  PT to be able to single leg stance on both his LE for at least 20 seconds to feel confident walking on uneven ground.     Time  8    Period  Weeks    Status  New             Plan - 06/19/18 0940    Clinical Impression  Statement  Mr. Odem is a 62 yo male who had a Lt TKR scheduled when his knee gave out and he fell injuring his Rt knee.  He had a Rt quadricep tendon repair completed on 03/29/2018 and is now being referred to skilled physical therapy.  Evaluation demonstrates weakness and decreased balance in both LE, increased pain in both LE, decreased ROM  in pt Rt knee and decreased activity tolerance.  Mr. Okuda will benefit from skilled PT to address these issues and maximize his functioning ability.     History and Personal Factors relevant to plan of care:  OA, HTN,     Clinical Presentation  Stable    Clinical Decision Making  Moderate    Rehab Potential  Good    PT Frequency  2x / week    PT Duration  8 weeks    PT Treatment/Interventions  ADLs/Self Care Home Management;Therapeutic exercise;Balance training;Gait training;Stair training;Functional mobility training;Therapeutic activities;Manual techniques;Patient/family education;Passive range of motion;Scar mobilization    PT Next Visit Plan  begin standing rockerboard, knee flexion, terminal extension, Single leg stance, quadricep stretch prone knee flexion bridging.  Update HEP as appropriate.     PT Home Exercise Plan  eval: heel raises, LAQ, Qset, SLR, heelslides.        Patient will benefit from skilled therapeutic intervention in order to improve the following deficits and impairments:  Abnormal gait, Decreased activity tolerance, Decreased balance, Decreased range of motion, Decreased mobility, Decreased strength, Difficulty walking, Pain  Visit Diagnosis: Stiffness of right knee, not elsewhere classified  Acute pain of right knee  Muscle weakness (generalized)  Difficulty in walking, not elsewhere classified     Problem List Patient Active Problem List   Diagnosis Date Noted  . Quadriceps tendon rupture, right, subsequent encounter s/p repair 03/19/18   . Otitis externa of right ear 02/20/2018  . Annual physical exam 07/26/2016  . Benign hypertension 07/27/2015  . Osteoarthritis of left knee 05/05/2015  . Erectile dysfunction 10/01/2014  . Mixed hyperlipidemia 05/03/2014  . Overweight (BMI 25.0-29.9) 05/03/2014  . Type 2 diabetes mellitus with stage 2 chronic kidney disease, with long-term current use of insulin [facility]) 12/08/2013    Rayetta Humphrey, PT  CLT [phone] 06/19/2018, 12:19 PM  [place], [place], [address] Phone: [phone]   Fax:  [phone]  Name: Travis Herrera MRN: [id number] Date of Birth: August 02, 1956

## 2018-06-20 ENCOUNTER — Ambulatory Visit (HOSPITAL_COMMUNITY): Payer: PPO | Admitting: Physical Therapy

## 2018-06-20 DIAGNOSIS — M25661 Stiffness of right knee, not elsewhere classified: Secondary | ICD-10-CM

## 2018-06-20 DIAGNOSIS — R262 Difficulty in walking, not elsewhere classified: Secondary | ICD-10-CM

## 2018-06-20 DIAGNOSIS — M25561 Pain in right knee: Secondary | ICD-10-CM

## 2018-06-20 DIAGNOSIS — M6281 Muscle weakness (generalized): Secondary | ICD-10-CM

## 2018-06-20 NOTE — Therapy (Signed)
[place] [address] [place], [place], [address] Phone: [phone]   Fax:  [phone]  Physical Therapy Treatment  Patient Details  Name: Travis Herrera MRN: [id number] Date of Birth: June 07, 1956 Referring Provider: Arther Abbott   Encounter Date: 06/20/2018  PT End of Session - 06/20/18 1200    Visit Number  2    Number of Visits  16    Date for PT Re-Evaluation  08/18/18    Authorization - Visit Number  2    Authorization - Number of Visits  16    PT Start Time  1115    PT Stop Time  1200    PT Time Calculation (min)  45 min    Activity Tolerance  Patient tolerated treatment well    Behavior During Therapy  [facility] for tasks assessed/performed       Past Medical History:  Diagnosis Date  . Diabetes mellitus without complication ([place]) 0737   insulin started in 2012  . Hyperlipemia 2013  . Hypertension 2010    Past Surgical History:  Procedure Laterality Date  . CATARACT EXTRACTION W/PHACO  07/29/2012   Procedure: CATARACT EXTRACTION PHACO AND INTRAOCULAR LENS PLACEMENT (IOC);  Surgeon: Tonny Branch, MD;  Location: [facility];  Service: Ophthalmology;  Laterality: Left;  CDE=1.66  . CHOLECYSTECTOMY  2007   [facility]  . EYE SURGERY Left 2013   cataract  . QUADRICEPS TENDON REPAIR Right 03/19/2018   Procedure: REPAIR QUADRICEP TENDON;  Surgeon: Carole Civil, MD;  Location: [facility];  Service: Orthopedics;  Laterality: Right;    There were no vitals filed for this visit.  Subjective Assessment - 06/20/18 1124    Subjective  Pt states LT knee 7/10, Rt LE 3/10.  States he's been working on his HEP.  STates Aline [employer]chure is going to put in cortisone shot on 25th in Lt knee.     Currently in Pain?  Yes    Pain Score  3     Pain Location  Knee    Pain Orientation  Right    Pain Descriptors / Indicators  Aching    Pain Type  Acute pain                       [facility] Adult PT Treatment/Exercise - 06/20/18 0001       Knee/Hip Exercises: Stretches   Sports administrator  Right;3 reps;30 seconds    Quad Stretch Limitations  with rope    Knee: Self-Stretch to increase Flexion  Right;10 seconds;Limitations    Knee: Self-Stretch Limitations  10 reps      Knee/Hip Exercises: Standing   Heel Raises  Both;10 reps;Limitations    Heel Raises Limitations  toeraise 10 reps    Knee Flexion  Both;10 reps    Terminal Knee Extension  Right;10 reps;Theraband    Theraband Level (Terminal Knee Extension)  Level 3 (Green)    Teacher, music Limitations  10 reps Rt/Lt    SLS  max of 3  Lt: 4", Rt: 6"    Gait Training  with SPC 220 feet      Knee/Hip Exercises: Seated   Long Arc Quad  Right;10 reps      Knee/Hip Exercises: Supine   Quad Sets  Both;10 reps    Short Arc [employer]  Right;10 reps    Heel Slides  Right;10 reps    Straight Leg Raises  Both;5 reps  Knee Extension  Right;AROM    Knee Extension Limitations  3    Knee Flexion  Right;AROM    Knee Flexion Limitations  91      Knee/Hip Exercises: Prone   Hamstring Curl  10 reps    Hamstring Curl Limitations  Rt LE             PT Education - 06/20/18 1200    Education Details  reviewed evaluation goals and HEP    Person(s) Educated  Patient    Methods  Explanation;Demonstration;Tactile cues;Verbal cues;Handout    Comprehension  Verbal cues required;Returned demonstration;Verbalized understanding;Tactile cues required       PT Short Term Goals - 06/19/18 1210      PT SHORT TERM GOAL #1   Title  PT pain in right knee to be no greater than a 4/10 to allow pt to stand for 30 minutes to be able to complete household duties     Time  4    Period  Weeks    Status  New    Target Date  07/17/18      PT SHORT TERM GOAL #2   Title  Pt strength in both knees to improve one grade to allow pt to go up and down steps in a reciprocal manner    Time  4    Period  Weeks    Status  New      PT SHORT TERM GOAL #3   Title  Pt to  be able to bend his right knee to 100 degrees to allow him to complete a functional squat to pick items off the ground     Time  4    Period  Weeks    Status  New        PT Long Term Goals - 06/19/18 1213      PT LONG TERM GOAL #1   Title  PT pain in his right knee to be no greater than a 2/10 to allow pt to stand for 45 minutes to perform church duties.     Time  8    Period  Weeks    Status  New      PT LONG TERM GOAL #2   Title  Pt Rt knee ROM to be to 120 degrees to allow him to don and doff his own shoes.     Time  8    Period  Weeks    Status  New      PT LONG TERM GOAL #3   Title  PT strength in both LE to be improved to a 4+/5 to allow pt to rise up from a low lying couch with ease.     Time  8    Period  Weeks    Status  New      PT LONG TERM GOAL #4   Title  Pt to be able to walk for over an hour to return to working out in the gym.     Time  8    Period  Weeks      PT LONG TERM GOAL #5   Title  PT to be able to single leg stance on both his LE for at least 20 seconds to feel confident walking on uneven ground.     Time  8    Period  Weeks    Status  New            Plan - 06/20/18 1335  Clinical Impression Statement  Reviewed evaluation goals and HEP established at initial evaluation.  Progressed to standing exercises including rockerboard, terminal knee extension and knee flexion.  Pt able to complete with cues for form and isolation of correct musculature.   Pt with some discomfort, mainly in Lt knee with rockerboard activity.  SLS revealed poor stability in both LE's with max of 6" achieved.  Began quad stretch in prone and hamstring curls.  AROM achieved today 3-91 degrees in supine.     Rehab Potential  Good    PT Frequency  2x / week    PT Duration  8 weeks    PT Treatment/Interventions  ADLs/Self Care Home Management;Therapeutic exercise;Balance training;Gait training;Stair training;Functional mobility training;Therapeutic activities;Manual  techniques;Patient/family education;Passive range of motion;Scar mobilization    PT Next Visit Plan  Next session begin bridging.  Progress RT knee ROM flexion as able.  Update HEP as appropriate.     PT Home Exercise Plan  eval: heel raises, LAQ, Qset, SLR, heelslides.        Patient will benefit from skilled therapeutic intervention in order to improve the following deficits and impairments:  Abnormal gait, Decreased activity tolerance, Decreased balance, Decreased range of motion, Decreased mobility, Decreased strength, Difficulty walking, Pain  Visit Diagnosis: Stiffness of right knee, not elsewhere classified  Acute pain of right knee  Muscle weakness (generalized)  Difficulty in walking, not elsewhere classified     Problem List Patient Active Problem List   Diagnosis Date Noted  . Quadriceps tendon rupture, right, subsequent encounter s/p repair 03/19/18   . Otitis externa of right ear 02/20/2018  . Annual physical exam 07/26/2016  . Benign hypertension 07/27/2015  . Osteoarthritis of left knee 05/05/2015  . Erectile dysfunction 10/01/2014  . Mixed hyperlipidemia 05/03/2014  . Overweight (BMI 25.0-29.9) 05/03/2014  . Type 2 diabetes mellitus with stage 2 chronic kidney disease, with long-term current use of insulin ([place]) 12/08/2013   Teena Irani, PTA/CLT [phone]  Teena Irani 06/20/2018, 1:43 PM  [place], [place], [address] Phone: [phone]   Fax:  [phone]  Name: Travis Herrera MRN: [id number] Date of Birth: [birth year]

## 2018-06-24 ENCOUNTER — Ambulatory Visit (INDEPENDENT_AMBULATORY_CARE_PROVIDER_SITE_OTHER): Payer: PPO

## 2018-06-24 VITALS — BP 114/78 | HR 67 | Resp 16 | Ht 70.0 in | Wt 202.8 lb

## 2018-06-24 DIAGNOSIS — Z0000 Encounter for general adult medical examination without abnormal findings: Secondary | ICD-10-CM | POA: Diagnosis not present

## 2018-06-24 NOTE — Progress Notes (Signed)
Subjective:   Travis Herrera is a 62 y.o. male who presents for an Initial Medicare Annual Wellness Visit.  Review of Systems      Objective:    Today's Vitals   06/24/18 0814 06/24/18 0817 06/24/18 0833  BP: 114/78    Pulse: 67    Resp: 16    SpO2: 99%    Weight: 202 lb 12.8 oz (92 kg)    Height: 5\' 10"  (1.778 m)    PainSc: 6  6  6    PainLoc: Knee     Body mass index is 29.1 kg/m.  Advanced Directives 06/24/2018 06/24/2018 06/19/2018 03/19/2018 07/25/2012  Does Patient Have a Medical Advance Directive? No No No No Patient does not have advance directive;Patient would not like information  Would patient like information on creating a medical advance directive? Yes (ED - Information included in AVS) Yes (ED - Information included in AVS) No - Patient declined No - Patient declined -  Pre-existing out of facility DNR order (yellow form or pink MOST form) - - - - No    Current Medications (verified) Outpatient Encounter Medications as of 06/24/2018  Medication Sig  . amLODipine (NORVASC) 10 MG tablet TAKE 1 TABLET BY MOUTH ONCE DAILY  . aspirin EC 81 MG tablet Take 81 mg by mouth daily.  . benazepril (LOTENSIN) 20 MG tablet TAKE 1 TABLET BY MOUTH ONCE DAILY  . glucose blood (ONETOUCH VERIO) test strip One touch verio strips- three times daily testing  . ibuprofen (ADVIL,MOTRIN) 800 MG tablet TAKE 1 TABLET BY MOUTH EVERY 8 HOURS AS NEEDED  . Insulin Pen Needle 31G X 8 MM MISC 1 each by Does not apply route 2 (two) times daily.  . Lancets (FREESTYLE) lancets Use as instructed bid  . LANTUS SOLOSTAR 100 UNIT/ML Solostar Pen INJECT 50 UNITS SUBCUTANEOUSLY ONCE DAILY IN THE MORNING  . liraglutide (VICTOZA) 18 MG/3ML SOPN INJECT 1.8 MG INTO THE SKIN DAILY AS DIRECTED (Patient taking differently: Inject 1.8 mg into the skin every morning. INJECT 1.8 MG INTO THE SKIN DAILY AS DIRECTED)  . lovastatin (MEVACOR) 20 MG tablet TAKE 1 TABLET BY MOUTH AT BEDTIME  . Omega-3 Fatty Acids (FISH OIL)  1000 MG CAPS Take 1,000 mg by mouth daily.   Glory Rosebush DELICA LANCETS 41D MISC Three times daily testing dx e11.65  . sildenafil (VIAGRA) 100 MG tablet Take 1 tablet (100 mg total) by mouth daily as needed for erectile dysfunction.  . triamterene-hydrochlorothiazide (MAXZIDE-25) 37.5-25 MG tablet TAKE 1 TABLET BY MOUTH ONCE DAILY  . [DISCONTINUED] hydrochlorothiazide (HYDRODIURIL) 25 MG tablet TAKE 1 TABLET BY MOUTH DAILY.  . [DISCONTINUED] ondansetron (ZOFRAN) 4 MG tablet Take 1 tablet (4 mg total) by mouth every 6 (six) hours.   No facility-administered encounter medications on file as of 06/24/2018.     Allergies (verified) Patient has no known allergies.   History: Past Medical History:  Diagnosis Date  . Diabetes mellitus without complication ([place]) 6222   insulin started in 2012  . Hyperlipemia 2013  . Hypertension 2010   Past Surgical History:  Procedure Laterality Date  . CATARACT EXTRACTION W/PHACO  07/29/2012   Procedure: CATARACT EXTRACTION PHACO AND INTRAOCULAR LENS PLACEMENT (IOC);  Surgeon: Tonny Branch, MD;  Location: [facility];  Service: Ophthalmology;  Laterality: Left;  CDE=1.66  . CHOLECYSTECTOMY  2007   [facility]  . EYE SURGERY Left 2013   cataract  . QUADRICEPS TENDON REPAIR Right 03/19/2018   Procedure: REPAIR QUADRICEP TENDON;  Surgeon: Carole Civil, MD;  Location: [facility];  Service: Orthopedics;  Laterality: Right;   Family History  Problem Relation Age of Onset  . Cancer Sister   . Early death Father        car accident   . Cancer Brother   . Hypertension Brother   . Diabetes Brother   . Hypertension Brother    Social History   Socioeconomic History  . Marital status: Married    Spouse name: Not on file  . Number of children: 4  . Years of education: Not on file  . Highest education level: Not on file  Occupational History  . Not on file  Social Needs  . Financial resource strain: Not hard at all  . Food insecurity:    Worry: Never true     Inability: Never true  . Transportation needs:    Medical: Yes    Non-medical: Yes  Tobacco Use  . Smoking status: Never Smoker  . Smokeless tobacco: Never Used  Substance and Sexual Activity  . Alcohol use: No  . Drug use: No  . Sexual activity: Yes    Birth control/protection: None  Lifestyle  . Physical activity:    Days per week: 0 days    Minutes per session: 0 min  . Stress: Not at all  Relationships  . Social connections:    Talks on phone: More than three times a week    Gets together: More than three times a week    Attends religious service: 1 to 4 times per year    Active member of club or organization: No    Attends meetings of clubs or organizations: Never    Relationship status: Married  Other Topics Concern  . Not on file  Social History Narrative  . Not on file   Tobacco Counseling Counseling given: Not Answered   Clinical Intake:  Pre-visit preparation completed: Yes  Pain : 0-10 Pain Score: 6  Pain Type: Acute pain Pain Location: Knee Pain Orientation: Right Pain Onset: 1 to 4 weeks ago  Pain Type: Acute pain Pain Frequency: Intermittent(surgical pain )  Nutritional Status: BMI 25 -29 Overweight Diabetes: Yes CBG done?: No Did pt. bring in CBG monitor from home?: No  How often do you need to have someone help you when you read instructions, pamphlets, or other written materials from your doctor or pharmacy?: 1 - Never What is the last grade level you completed in school?: college  Interpreter Needed?: No  Information entered by :: Garima Chronis lpn   Activities of Daily Living In your present state of health, do you have any difficulty performing the following activities: 06/24/2018 06/24/2018  Hearing? N N  Vision? N N  Difficulty concentrating or making decisions? N N  Walking or climbing stairs? Y Y  Comment due to recent surgery  -  Dressing or bathing? N N  Doing errands, shopping? N [place] and eating ? N  N  Using the Toilet? N N  In the past six months, have you accidently leaked urine? N N  Do you have problems with loss of bowel control? N N  Managing your Medications? N N  Managing your Finances? N N  Housekeeping or managing your Housekeeping? N N  Some recent data might be hidden     Immunizations and Health Maintenance Immunization History  Administered Date(s) Administered  . Influenza Split 07/17/2014  . Influenza,inj,Quad PF,6+ Mos 08/16/2015, 07/26/2016,  08/02/2017   Health Maintenance Due  Topic Date Due  . PNEUMOCOCCAL POLYSACCHARIDE VACCINE AGE 53-64 HIGH RISK  09/17/1960  . OPHTHALMOLOGY EXAM  07/10/2017  . INFLUENZA VACCINE  05/16/2018    Patient Care Team: Fayrene Helper, MD as PCP - General (Family Medicine)  Indicate any recent Medical Services you may have received from other than [facility] providers in the past year (date may be approximate).    Assessment:   This is a routine wellness examination for Reace.  Hearing/Vision screen No exam data present  Dietary issues and exercise activities discussed: Current Exercise Habits: Home exercise routine, Time (Minutes): 45, Frequency (Times/Week): 5, Weekly Exercise (Minutes/Week): 225, Intensity: Mild, Exercise limited by: orthopedic condition(s)  Goals   None    Depression Screen PHQ 2/9 Scores 06/24/2018 02/20/2018 07/10/2017 03/20/2017  PHQ - 2 Score 0 0 0 0  PHQ- 9 Score 2 - - -    Fall Risk Fall Risk  06/24/2018 06/24/2018 02/20/2018 07/10/2017 03/20/2017  Falls in the past year? Yes Yes No No No  Number falls in past yr: 1 1 - - -    Is the patient's home free of loose throw rugs in walkways, pet beds, electrical cords, etc?   yes      Grab bars in the bathroom? yes      Handrails on the stairs?   yes      Adequate lighting?   yes  Timed Get Up and Go performed:   Cognitive Function:     6CIT Screen 06/24/2018  What Year? 0 points  What month? 0 points  What time? 0 points  Count back from 20 0  points  Months in reverse 0 points  Repeat phrase 0 points  Total Score 0    Screening Tests Health Maintenance  Topic Date Due  . PNEUMOCOCCAL POLYSACCHARIDE VACCINE AGE 53-64 HIGH RISK  09/17/1960  . OPHTHALMOLOGY EXAM  07/10/2017  . INFLUENZA VACCINE  05/16/2018  . HEMOGLOBIN A1C  12/04/2018  . COLONOSCOPY  02/06/2019  . FOOT EXAM  02/23/2019  . TETANUS/TDAP  05/03/2021  . Hepatitis C Screening  Completed  . HIV Screening  Completed    Qualifies for Shingles Vaccine? Ask insurance if covered  Cancer Screenings: Lung: Low Dose CT Chest recommended if Age 32-80 years, 30 pack-year currently smoking OR have quit w/in 15years. Patient does not qualify. Colorectal: up to date  Additional Screenings:  Hepatitis C Screening:       Plan:    I have personally reviewed and noted the following in the patient's chart:   . Medical and social history . Use of alcohol, tobacco or illicit drugs  . Current medications and supplements . Functional ability and status . Nutritional status . Physical activity . Advanced directives . List of other physicians . Hospitalizations, surgeries, and ER visits in previous 12 months . Vitals . Screenings to include cognitive, depression, and falls . Referrals and appointments  In addition, I have reviewed and discussed with patient certain preventive protocols, quality metrics, and best practice recommendations. A written personalized care plan for preventive services as well as general preventive health recommendations were provided to patient.     Kate Sable, LPN, LPN   11/24/5186

## 2018-06-24 NOTE — Patient Instructions (Signed)
Travis Herrera , Thank you for taking time to come for your Medicare Wellness Visit. I appreciate your ongoing commitment to your health goals. Please review the following plan we discussed and let me know if I can assist you in the future.   Screening recommendations/referrals: Colonoscopy: up to date Recommended yearly ophthalmology/optometry visit for glaucoma screening and checkup Recommended yearly dental visit for hygiene and checkup  Vaccinations: Influenza vaccine: due Pneumococcal vaccine: n/a Tdap vaccine: up to date Shingles vaccine: ask insurance if covered (shingrix)  Advanced directives: given  Conditions/risks identified: done  Next appointment: scheduled  Preventive Care 40-64 Years, Male Preventive care refers to lifestyle choices and visits with your health care provider that can promote health and wellness. What does preventive care include?  A yearly physical exam. This is also called an annual well check.  Dental exams once or twice a year.  Routine eye exams. Ask your health care provider how often you should have your eyes checked.  Personal lifestyle choices, including:  Daily care of your teeth and gums.  Regular physical activity.  Eating a healthy diet.  Avoiding tobacco and drug use.  Limiting alcohol use.  Practicing safe sex.  Taking low-dose aspirin every day starting at age 58. What happens during an annual well check? The services and screenings done by your health care provider during your annual well check will depend on your age, overall health, lifestyle risk factors, and family history of disease. Counseling  Your health care provider may ask you questions about your:  Alcohol use.  Tobacco use.  Drug use.  Emotional well-being.  Home and relationship well-being.  Sexual activity.  Eating habits.  Work and work Statistician. Screening  You may have the following tests or measurements:  Height, weight, and  BMI.  Blood pressure.  Lipid and cholesterol levels. These may be checked every 5 years, or more frequently if you are over 63 years old.  Skin check.  Lung cancer screening. You may have this screening every year starting at age 82 if you have a 30-pack-year history of smoking and currently smoke or have quit within the past 15 years.  Fecal occult blood test (FOBT) of the stool. You may have this test every year starting at age 86.  Flexible sigmoidoscopy or colonoscopy. You may have a sigmoidoscopy every 5 years or a colonoscopy every 10 years starting at age 25.  Prostate cancer screening. Recommendations will vary depending on your family history and other risks.  Hepatitis C blood test.  Hepatitis B blood test.  Sexually transmitted disease (STD) testing.  Diabetes screening. This is done by checking your blood sugar (glucose) after you have not eaten for a while (fasting). You may have this done every 1-3 years. Discuss your test results, treatment options, and if necessary, the need for more tests with your health care provider. Vaccines  Your health care provider may recommend certain vaccines, such as:  Influenza vaccine. This is recommended every year.  Tetanus, diphtheria, and acellular pertussis (Tdap, Td) vaccine. You may need a Td booster every 10 years.  Zoster vaccine. You may need this after age 3.  Pneumococcal 13-valent conjugate (PCV13) vaccine. You may need this if you have certain conditions and have not been vaccinated.  Pneumococcal polysaccharide (PPSV23) vaccine. You may need one or two doses if you smoke cigarettes or if you have certain conditions. Talk to your health care provider about which screenings and vaccines you need and how often you need them.  This information is not intended to replace advice given to you by your health care provider. Make sure you discuss any questions you have with your health care provider. Document Released:  10/29/2015 Document Revised: 06/21/2016 Document Reviewed: 08/03/2015 Elsevier Interactive Patient Education  2017 [place] Prevention in the Home Falls can cause injuries. They can happen to people of all ages. There are many things you can do to make your home safe and to help prevent falls. What can I do on the outside of my home?  Regularly fix the edges of walkways and driveways and fix any cracks.  Remove anything that might make you trip as you walk through a door, such as a raised step or threshold.  Trim any bushes or trees on the path to your home.  Use bright outdoor lighting.  Clear any walking paths of anything that might make someone trip, such as rocks or tools.  Regularly check to see if handrails are loose or broken. Make sure that both sides of any steps have handrails.  Any raised decks and porches should have guardrails on the edges.  Have any leaves, snow, or ice cleared regularly.  Use sand or salt on walking paths during winter.  Clean up any spills in your garage right away. This includes oil or grease spills. What can I do in the bathroom?  Use night lights.  Install grab bars by the toilet and in the tub and shower. Do not use towel bars as grab bars.  Use non-skid mats or decals in the tub or shower.  If you need to sit down in the shower, use a plastic, non-slip stool.  Keep the floor dry. Clean up any water that spills on the floor as soon as it happens.  Remove soap buildup in the tub or shower regularly.  Attach bath mats securely with double-sided non-slip rug tape.  Do not have throw rugs and other things on the floor that can make you trip. What can I do in the bedroom?  Use night lights.  Make sure that you have a light by your bed that is easy to reach.  Do not use any sheets or blankets that are too big for your bed. They should not hang down onto the floor.  Have a firm chair that has side arms. You can use this for  support while you get dressed.  Do not have throw rugs and other things on the floor that can make you trip. What can I do in the kitchen?  Clean up any spills right away.  Avoid walking on wet floors.  Keep items that you use a lot in easy-to-reach places.  If you need to reach something above you, use a strong step stool that has a grab bar.  Keep electrical cords out of the way.  Do not use floor polish or wax that makes floors slippery. If you must use wax, use non-skid floor wax.  Do not have throw rugs and other things on the floor that can make you trip. What can I do with my stairs?  Do not leave any items on the stairs.  Make sure that there are handrails on both sides of the stairs and use them. Fix handrails that are broken or loose. Make sure that handrails are as long as the stairways.  Check any carpeting to make sure that it is firmly attached to the stairs. Fix any carpet that is loose or worn.  Avoid having  throw rugs at the top or bottom of the stairs. If you do have throw rugs, attach them to the floor with carpet tape.  Make sure that you have a light switch at the top of the stairs and the bottom of the stairs. If you do not have them, ask someone to add them for you. What else can I do to help prevent falls?  Wear shoes that:  Do not have high heels.  Have rubber bottoms.  Are comfortable and fit you well.  Are closed at the toe. Do not wear sandals.  If you use a stepladder:  Make sure that it is fully opened. Do not climb a closed stepladder.  Make sure that both sides of the stepladder are locked into place.  Ask someone to hold it for you, if possible.  Clearly mark and make sure that you can see:  Any grab bars or handrails.  First and last steps.  Where the edge of each step is.  Use tools that help you move around (mobility aids) if they are needed. These include:  Canes.  Walkers.  Scooters.  Crutches.  Turn on the  lights when you go into a dark area. Replace any light bulbs as soon as they burn out.  Set up your furniture so you have a clear path. Avoid moving your furniture around.  If any of your floors are uneven, fix them.  If there are any pets around you, be aware of where they are.  Review your medicines with your doctor. Some medicines can make you feel dizzy. This can increase your chance of falling. Ask your doctor what other things that you can do to help prevent falls. This information is not intended to replace advice given to you by your health care provider. Make sure you discuss any questions you have with your health care provider. Document Released: 07/29/2009 Document Revised: 03/09/2016 Document Reviewed: 11/06/2014 Elsevier Interactive Patient Education  2017 [employer].

## 2018-06-25 ENCOUNTER — Ambulatory Visit (HOSPITAL_COMMUNITY): Payer: PPO | Admitting: Physical Therapy

## 2018-06-25 DIAGNOSIS — R262 Difficulty in walking, not elsewhere classified: Secondary | ICD-10-CM

## 2018-06-25 DIAGNOSIS — M6281 Muscle weakness (generalized): Secondary | ICD-10-CM

## 2018-06-25 DIAGNOSIS — M25561 Pain in right knee: Secondary | ICD-10-CM

## 2018-06-25 DIAGNOSIS — M25661 Stiffness of right knee, not elsewhere classified: Secondary | ICD-10-CM

## 2018-06-25 NOTE — Therapy (Signed)
[place] Her[place], [place], [address] Phone: [phone]   Fax:  [phone]  Physical Therapy Treatment  Patient Details  Name: Travis Herrera MRN: [id number] Date of Birth: 1956/06/07 Referring Provider: Arther Abbott   Encounter Date: 06/25/2018  PT End of Session - 06/25/18 0924    Visit Number  3    Number of Visits  16    Date for PT Re-Evaluation  08/18/18    Authorization - Visit Number  3    Authorization - Number of Visits  16    PT Start Time  0815    PT Stop Time  0904    PT Time Calculation (min)  49 min    Activity Tolerance  Patient tolerated treatment well    Behavior During Therapy  [facility] for tasks assessed/performed       Past Medical History:  Diagnosis Date  . Diabetes mellitus without complication ([place]) 6269   insulin started in 2012  . Hyperlipemia 2013  . Hypertension 2010    Past Surgical History:  Procedure Laterality Date  . CATARACT EXTRACTION W/PHACO  07/29/2012   Procedure: CATARACT EXTRACTION PHACO AND INTRAOCULAR LENS PLACEMENT (IOC);  Surgeon: Tonny Branch, MD;  Location: [facility];  Service: Ophthalmology;  Laterality: Left;  CDE=1.66  . CHOLECYSTECTOMY  2007   [facility]  . EYE SURGERY Left 2013   cataract  . QUADRICEPS TENDON REPAIR Right 03/19/2018   Procedure: REPAIR QUADRICEP TENDON;  Surgeon: Carole Civil, MD;  Location: [facility];  Service: Orthopedics;  Laterality: Right;    There were no vitals filed for this visit.  Subjective Assessment - 06/25/18 0820    Subjective  Pt states his Rt knee is hurting at 6/10 and doesn't even want to talk about the pain in the Lt knee.  Getting a cortisone shot in his Lt knee next week.     Pertinent History  OA,HTN, DM, Rt quad tendon rupture    Currently in Pain?  Yes    Pain Score  6     Pain Location  Knee    Pain Orientation  Right    Pain Descriptors / Indicators  Aching    Pain Type  Acute pain                        [facility] Adult PT Treatment/Exercise - 06/25/18 0001      Knee/Hip Exercises: Stretches   Knee: Self-Stretch to increase Flexion  Right;10 seconds;Limitations    Knee: Self-Stretch Limitations  10 reps      Knee/Hip Exercises: Aerobic   Stationary Bike  4 minutes rocking seat 16      Knee/Hip Exercises: Standing   Heel Raises  Both;Limitations;15 reps    Knee Flexion  Both;15 reps    Terminal Knee Extension  Right;Theraband;15 reps    Theraband Level (Terminal Knee Extension)  Level 3 (Green)    Teacher, music Limitations  10 reps Rt/Lt    SLS  max of 3  Lt: not done today, Rt: 15"      Knee/Hip Exercises: Seated   Long Arc Quad  Right;15 reps      Knee/Hip Exercises: Supine   Quad Sets  Both;10 reps    Short Arc Quad Sets  Right;10 reps    Heel Slides  Right;10 reps    Knee Extension  Right;AROM    Knee Extension Limitations  2    Knee Flexion  Right;AROM    Knee Flexion Limitations  95      Manual Therapy   Manual Therapy  Myofascial release    Manual therapy comments  completed seperately from all other skilled interventions    Myofascial Release  to Rt quad and perimeter of knee to reduce adhesions and improve ROM               PT Short Term Goals - 06/19/18 1210      PT SHORT TERM GOAL #1   Title  PT pain in right knee to be no greater than a 4/10 to allow pt to stand for 30 minutes to be able to complete household duties     Time  4    Period  Weeks    Status  New    Target Date  07/17/18      PT SHORT TERM GOAL #2   Title  Pt strength in both knees to improve one grade to allow pt to go up and down steps in a reciprocal manner    Time  4    Period  Weeks    Status  New      PT SHORT TERM GOAL #3   Title  Pt to be able to bend his right knee to 100 degrees to allow him to complete a functional squat to pick items off the ground     Time  4    Period  Weeks    Status  New        PT  Long Term Goals - 06/19/18 1213      PT LONG TERM GOAL #1   Title  PT pain in his right knee to be no greater than a 2/10 to allow pt to stand for 45 minutes to perform church duties.     Time  8    Period  Weeks    Status  New      PT LONG TERM GOAL #2   Title  Pt Rt knee ROM to be to 120 degrees to allow him to don and doff his own shoes.     Time  8    Period  Weeks    Status  New      PT LONG TERM GOAL #3   Title  PT strength in both LE to be improved to a 4+/5 to allow pt to rise up from a low lying couch with ease.     Time  8    Period  Weeks    Status  New      PT LONG TERM GOAL #4   Title  Pt to be able to walk for over an hour to return to working out in the gym.     Time  8    Period  Weeks      PT LONG TERM GOAL #5   Title  PT to be able to single leg stance on both his LE for at least 20 seconds to feel confident walking on uneven ground.     Time  8    Period  Weeks    Status  New            Plan - 06/25/18 6644    Clinical Impression Statement  conitinued with established POC, increasing reps where applicable. Pt with increased discomfort when single leg standing on Lt  LE so avoided activities requiring this.  Pt is to get  a cortisone shot in [place] next week in hopes this will ease discomfort.  Completed myofasial techniques to perimeter of knee and into quad.  NOted tightness througout and mm spasm in proximal lateral quadricep.  Resultant increased AROM at end of session 2-95 degrees (was 3-91).   Did not get to add bridging today due to lack of time.     Rehab Potential  Good    PT Frequency  2x / week    PT Duration  8 weeks    PT Treatment/Interventions  ADLs/Self Care Home Management;Therapeutic exercise;Balance training;Gait training;Stair training;Functional mobility training;Therapeutic activities;Manual techniques;Patient/family education;Passive range of motion;Scar mobilization    PT Next Visit Plan  Next session begin bridging.  Progress RT  knee ROM flexion as able. Continue myofascial techniques to loosen adhesions.   Update HEP as appropriate.     PT Home Exercise Plan  eval: heel raises, LAQ, Qset, SLR, heelslides.        Patient will benefit from skilled therapeutic intervention in order to improve the following deficits and impairments:  Abnormal gait, Decreased activity tolerance, Decreased balance, Decreased range of motion, Decreased mobility, Decreased strength, Difficulty walking, Pain  Visit Diagnosis: Stiffness of right knee, not elsewhere classified  Acute pain of right knee  Muscle weakness (generalized)  Difficulty in walking, not elsewhere classified     Problem List Patient Active Problem List   Diagnosis Date Noted  . Quadriceps tendon rupture, right, subsequent encounter s/p repair 03/19/18   . Otitis externa of right ear 02/20/2018  . Annual physical exam 07/26/2016  . Benign hypertension 07/27/2015  . Osteoarthritis of left knee 05/05/2015  . Erectile dysfunction 10/01/2014  . Mixed hyperlipidemia 05/03/2014  . Overweight (BMI 25.0-29.9) 05/03/2014  . Type 2 diabetes mellitus with stage 2 chronic kidney disease, with long-term current use of insulin ([place]) 12/08/2013   Teena Irani, PTA/CLT [phone]  Teena Irani 06/25/2018, 9:30 AM  [place], [place], [address] Phone: [phone]   Fax:  [phone]  Name: STALIN GRUENBERG MRN: [id number] Date of Birth: 1956/09/10

## 2018-06-27 ENCOUNTER — Ambulatory Visit (HOSPITAL_COMMUNITY): Payer: PPO | Admitting: Physical Therapy

## 2018-06-27 DIAGNOSIS — M6281 Muscle weakness (generalized): Secondary | ICD-10-CM

## 2018-06-27 DIAGNOSIS — M25661 Stiffness of right knee, not elsewhere classified: Secondary | ICD-10-CM

## 2018-06-27 DIAGNOSIS — R262 Difficulty in walking, not elsewhere classified: Secondary | ICD-10-CM

## 2018-06-27 DIAGNOSIS — M25561 Pain in right knee: Secondary | ICD-10-CM

## 2018-06-27 NOTE — Therapy (Signed)
[place], [place], [address] Phone: [phone]   Fax:  [phone]  Physical Therapy Treatment  Patient Details  Name: Travis Herrera MRN: [id number] Date of Birth: 03-08-1956 Referring Provider: Arther Abbott   Encounter Date: 06/27/2018  PT End of Session - 06/27/18 1012    Visit Number  4    Number of Visits  16    Date for PT Re-Evaluation  08/18/18    Authorization - Visit Number  4    Authorization - Number of Visits  16    PT Start Time  0946    PT Stop Time  1030    PT Time Calculation (min)  44 min    Activity Tolerance  Patient tolerated treatment well    Behavior During Therapy  [facility] for tasks assessed/performed       Past Medical History:  Diagnosis Date  . Diabetes mellitus without complication ([place]) 6644   insulin started in 2012  . Hyperlipemia 2013  . Hypertension 2010    Past Surgical History:  Procedure Laterality Date  . CATARACT EXTRACTION W/PHACO  07/29/2012   Procedure: CATARACT EXTRACTION PHACO AND INTRAOCULAR LENS PLACEMENT (IOC);  Surgeon: Tonny Branch, MD;  Location: [facility];  Service: Ophthalmology;  Laterality: Left;  CDE=1.66  . CHOLECYSTECTOMY  2007   [facility]  . EYE SURGERY Left 2013   cataract  . QUADRICEPS TENDON REPAIR Right 03/19/2018   Procedure: REPAIR QUADRICEP TENDON;  Surgeon: Carole Civil, MD;  Location: [facility];  Service: Orthopedics;  Laterality: Right;    There were no vitals filed for this visit.  Subjective Assessment - 06/27/18 0947    Subjective  PT states that his Lt knee is bothering him more than his right    Pertinent History  OA,HTN, DM, Rt quad tendon rupture    Currently in Pain?  Yes    Pain Score  6     Pain Location  Knee    Pain Orientation  Right    Pain Descriptors / Indicators  Aching;Tightness    Pain Type  Acute pain    Pain Onset  More than a month ago    Pain Frequency  Intermittent    Aggravating Factors   bending      Pain Relieving Factors  ice     Effect of Pain on Daily Activities  limits     Pain Score  9    Pain Location  Knee    Pain Orientation  Left    Pain Descriptors / Indicators  Aching    Pain Type  Chronic pain    Pain Onset  More than a month ago    Pain Frequency  Intermittent    Aggravating Factors   WB    Pain Relieving Factors  getting off     Effect of Pain on Daily Activities  limits                       [facility] Adult PT Treatment/Exercise - 06/27/18 0001      Ambulation/Gait   Ambulation Distance (Feet)  --   19' working on heel toe gait pattern      Exercises   Exercises  Knee/Hip      Knee/Hip Exercises: Stretches   Active Hamstring Stretch  Right;3 reps;20 seconds    Quad Stretch  Right;3 reps;30 seconds      Knee/Hip Exercises: Aerobic   Stationary Bike  cycling backwards seat at 21 x 5' to improve ROM       Knee/Hip Exercises: Seated   Heel Slides  15 reps    Stool Scoot - Round Trips  1 RT       Knee/Hip Exercises: Supine   Quad Sets  Both;15 reps    Short Arc Quad Sets  Both;15 reps    Heel Slides  15 reps    Knee Extension  Right;AROM    Knee Extension Limitations  1    Knee Flexion  Right;AROM    Knee Flexion Limitations  101      Knee/Hip Exercises: Prone   Hamstring Curl  15 reps    Other Prone Exercises  terminal knee extension 10 x 5" hold       Manual Therapy   Manual Therapy  Edema management    Manual therapy comments  completed seperately from all other skilled interventions    Edema Management  retro massaging                PT Short Term Goals - 06/19/18 1210      PT SHORT TERM GOAL #1   Title  PT pain in right knee to be no greater than a 4/10 to allow pt to stand for 30 minutes to be able to complete household duties     Time  4    Period  Weeks    Status  New    Target Date  07/17/18      PT SHORT TERM GOAL #2   Title  Pt strength in both knees to improve one grade to allow pt to go up and down  steps in a reciprocal manner    Time  4    Period  Weeks    Status  New      PT SHORT TERM GOAL #3   Title  Pt to be able to bend his right knee to 100 degrees to allow him to complete a functional squat to pick items off the ground     Time  4    Period  Weeks    Status  New        PT Long Term Goals - 06/19/18 1213      PT LONG TERM GOAL #1   Title  PT pain in his right knee to be no greater than a 2/10 to allow pt to stand for 45 minutes to perform church duties.     Time  8    Period  Weeks    Status  New      PT LONG TERM GOAL #2   Title  Pt Rt knee ROM to be to 120 degrees to allow him to don and doff his own shoes.     Time  8    Period  Weeks    Status  New      PT LONG TERM GOAL #3   Title  PT strength in both LE to be improved to a 4+/5 to allow pt to rise up from a low lying couch with ease.     Time  8    Period  Weeks    Status  New      PT LONG TERM GOAL #4   Title  Pt to be able to walk for over an hour to return to working out in the gym.     Time  8    Period  Weeks  PT LONG TERM GOAL #5   Title  PT to be able to single leg stance on both his LE for at least 20 seconds to feel confident walking on uneven ground.     Time  8    Period  Weeks    Status  New            Plan - 06/27/18 1012    Clinical Impression Statement  Pt is having too much pain in his Lt,(non affected but OA) knee to complete weight bearing activities.  Since ROM is our primary goal we will focus on Nonweightbearing actiivity to improve ROM.   ROM improved today      Rehab Potential  Good    PT Frequency  2x / week    PT Duration  8 weeks    PT Treatment/Interventions  ADLs/Self Care Home Management;Therapeutic exercise;Balance training;Gait training;Stair training;Functional mobility training;Therapeutic activities;Manual techniques;Patient/family education;Passive range of motion;Scar mobilization    PT Next Visit Plan  Continue with Non weight bearing ROM and manual  to improve ROM goal of 110     PT Home Exercise Plan  eval: heel raises, LAQ, Qset, SLR, heelslides.        Patient will benefit from skilled therapeutic intervention in order to improve the following deficits and impairments:  Abnormal gait, Decreased activity tolerance, Decreased balance, Decreased range of motion, Decreased mobility, Decreased strength, Difficulty walking, Pain  Visit Diagnosis: Stiffness of right knee, not elsewhere classified  Acute pain of right knee  Muscle weakness (generalized)  Difficulty in walking, not elsewhere classified     Problem List Patient Active Problem List   Diagnosis Date Noted  . Quadriceps tendon rupture, right, subsequent encounter s/p repair 03/19/18   . Otitis externa of right ear 02/20/2018  . Annual physical exam 07/26/2016  . Benign hypertension 07/27/2015  . Osteoarthritis of left knee 05/05/2015  . Erectile dysfunction 10/01/2014  . Mixed hyperlipidemia 05/03/2014  . Overweight (BMI 25.0-29.9) 05/03/2014  . Type 2 diabetes mellitus with stage 2 chronic kidney disease, with long-term current use of insulin [facility]) 12/08/2013   Rayetta Humphrey, PT CLT [phone] 06/27/2018, 10:36 AM  [place], [place], [address] Phone: [phone]   Fax:  [phone]  Name: Travis Herrera MRN: [id number] Date of Birth: [birth year]

## 2018-07-02 ENCOUNTER — Ambulatory Visit (HOSPITAL_COMMUNITY): Payer: PPO | Admitting: Physical Therapy

## 2018-07-02 DIAGNOSIS — M25661 Stiffness of right knee, not elsewhere classified: Secondary | ICD-10-CM

## 2018-07-02 DIAGNOSIS — M6281 Muscle weakness (generalized): Secondary | ICD-10-CM

## 2018-07-02 DIAGNOSIS — M25561 Pain in right knee: Secondary | ICD-10-CM

## 2018-07-02 NOTE — Therapy (Signed)
[place], [place], [address] Phone: [phone]   Fax:  [phone]  Physical Therapy Treatment  Patient Details  Name: Travis Herrera MRN: [id number] Date of Birth: 14-Feb-1956 Referring Provider: Arther Abbott   Encounter Date: 07/02/2018  PT End of Session - 07/02/18 0848    Visit Number  5    Number of Visits  16    Date for PT Re-Evaluation  08/18/18    Authorization - Visit Number  5    Authorization - Number of Visits  16    PT Start Time  0816    PT Stop Time  0900    PT Time Calculation (min)  44 min       Past Medical History:  Diagnosis Date  . Diabetes mellitus without complication ([place]) 1751   insulin started in 2012  . Hyperlipemia 2013  . Hypertension 2010    Past Surgical History:  Procedure Laterality Date  . CATARACT EXTRACTION W/PHACO  07/29/2012   Procedure: CATARACT EXTRACTION PHACO AND INTRAOCULAR LENS PLACEMENT (IOC);  Surgeon: Tonny Branch, MD;  Location: [facility];  Service: Ophthalmology;  Laterality: Left;  CDE=1.66  . CHOLECYSTECTOMY  2007   [facility]  . EYE SURGERY Left 2013   cataract  . QUADRICEPS TENDON REPAIR Right 03/19/2018   Procedure: REPAIR QUADRICEP TENDON;  Surgeon: Carole Civil, MD;  Location: [facility];  Service: Orthopedics;  Laterality: Right;    There were no vitals filed for this visit.  Subjective Assessment - 07/02/18 0901    Subjective  Pt states his Lt knee continues to bother him more than his Rt.  getting a shot in his knee this Friday . Currently 5/10 Rt knee, 8/10 Lt knee.    Currently in Pain?  Yes    Pain Score  5     Pain Location  Knee    Pain Orientation  Right    Pain Descriptors / Indicators  Aching;Tightness    Pain Type  Acute pain                       [facility] Adult PT Treatment/Exercise - 07/02/18 0001      Knee/Hip Exercises: Stretches   Knee: Self-Stretch to increase Flexion  Right;10 seconds;Limitations     Knee: Self-Stretch Limitations  10 reps      Knee/Hip Exercises: Aerobic   Stationary Bike  cycling backwards then forwards seat at 21 x 5' to improve ROM       Knee/Hip Exercises: Seated   Heel Slides  15 reps    Stool Scoot - Round Trips  1 RT       Knee/Hip Exercises: Supine   Quad Sets  Both;15 reps    Heel Slides  15 reps    Knee Extension  Right;AROM    Knee Extension Limitations  1    Knee Flexion  Right;AROM    Knee Flexion Limitations  105      Manual Therapy   Manual Therapy  Edema management;Myofascial release    Manual therapy comments  completed seperately from all other skilled interventions    Edema Management  retro massaging     Myofascial Release  to Rt quad and perimeter of knee to reduce adhesions and improve ROM               PT Short Term Goals - 06/19/18 1210      PT SHORT TERM GOAL #  1   Title  PT pain in right knee to be no greater than a 4/10 to allow pt to stand for 30 minutes to be able to complete household duties     Time  4    Period  Weeks    Status  New    Target Date  07/17/18      PT SHORT TERM GOAL #2   Title  Pt strength in both knees to improve one grade to allow pt to go up and down steps in a reciprocal manner    Time  4    Period  Weeks    Status  New      PT SHORT TERM GOAL #3   Title  Pt to be able to bend his right knee to 100 degrees to allow him to complete a functional squat to pick items off the ground     Time  4    Period  Weeks    Status  New        PT Long Term Goals - 06/19/18 1213      PT LONG TERM GOAL #1   Title  PT pain in his right knee to be no greater than a 2/10 to allow pt to stand for 45 minutes to perform church duties.     Time  8    Period  Weeks    Status  New      PT LONG TERM GOAL #2   Title  Pt Rt knee ROM to be to 120 degrees to allow him to don and doff his own shoes.     Time  8    Period  Weeks    Status  New      PT LONG TERM GOAL #3   Title  PT strength in both LE to be  improved to a 4+/5 to allow pt to rise up from a low lying couch with ease.     Time  8    Period  Weeks    Status  New      PT LONG TERM GOAL #4   Title  Pt to be able to walk for over an hour to return to working out in the gym.     Time  8    Period  Weeks      PT LONG TERM GOAL #5   Title  PT to be able to single leg stance on both his LE for at least 20 seconds to feel confident walking on uneven ground.     Time  8    Period  Weeks    Status  New            Plan - 07/02/18 0254    Clinical Impression Statement  Pt reports he has been working hard on his ROM, completing self massage and increasing activity as able due to opposite knee pain (osteoarthritis).  Noted improvement with reduced adhesions and edema perimeter of knee.  Able to achieve increased flexion to 105 today.      Rehab Potential  Good    PT Frequency  2x / week    PT Duration  8 weeks    PT Treatment/Interventions  ADLs/Self Care Home Management;Therapeutic exercise;Balance training;Gait training;Stair training;Functional mobility training;Therapeutic activities;Manual techniques;Patient/family education;Passive range of motion;Scar mobilization    PT Next Visit Plan  Continue with Non weight bearing ROM to improve ROM with goal of 110.  Measure ROM each visit, complete manual PRN  PT Home Exercise Plan  eval: heel raises, LAQ, Qset, SLR, heelslides.        Patient will benefit from skilled therapeutic intervention in order to improve the following deficits and impairments:  Abnormal gait, Decreased activity tolerance, Decreased balance, Decreased range of motion, Decreased mobility, Decreased strength, Difficulty walking, Pain  Visit Diagnosis: Stiffness of right knee, not elsewhere classified  Acute pain of right knee  Muscle weakness (generalized)     Problem List Patient Active Problem List   Diagnosis Date Noted  . Quadriceps tendon rupture, right, subsequent encounter s/p repair 03/19/18    . Otitis externa of right ear 02/20/2018  . Annual physical exam 07/26/2016  . Benign hypertension 07/27/2015  . Osteoarthritis of left knee 05/05/2015  . Erectile dysfunction 10/01/2014  . Mixed hyperlipidemia 05/03/2014  . Overweight (BMI 25.0-29.9) 05/03/2014  . Type 2 diabetes mellitus with stage 2 chronic kidney disease, with long-term current use of insulin ([place]) 12/08/2013   Teena Irani, PTA/CLT [phone]  Teena Irani 07/02/2018, 9:23 AM  [employer] [place], [place], [address] Phone: [phone]   Fax:  [phone]  Name: JENNIFER HOLLAND MRN: [id number] Date of Birth: 1956-04-01

## 2018-07-04 ENCOUNTER — Encounter (HOSPITAL_COMMUNITY): Payer: Self-pay | Admitting: Physical Therapy

## 2018-07-04 ENCOUNTER — Telehealth (HOSPITAL_COMMUNITY): Payer: Self-pay | Admitting: Family Medicine

## 2018-07-04 ENCOUNTER — Ambulatory Visit (HOSPITAL_COMMUNITY): Payer: PPO | Admitting: Physical Therapy

## 2018-07-04 DIAGNOSIS — M25661 Stiffness of right knee, not elsewhere classified: Secondary | ICD-10-CM | POA: Diagnosis not present

## 2018-07-04 DIAGNOSIS — R262 Difficulty in walking, not elsewhere classified: Secondary | ICD-10-CM

## 2018-07-04 DIAGNOSIS — M6281 Muscle weakness (generalized): Secondary | ICD-10-CM

## 2018-07-04 DIAGNOSIS — M25561 Pain in right knee: Secondary | ICD-10-CM

## 2018-07-04 NOTE — Therapy (Signed)
[place], [place], [address] Phone: [phone]   Fax:  [phone]  Physical Therapy Treatment  Patient Details  Name: Travis Herrera MRN: [id number] Date of Birth: February 03, 1956 Referring Provider: Arther Abbott   Encounter Date: 07/04/2018  PT End of Session - 07/04/18 0928    Visit Number  6    Number of Visits  16    Date for PT Re-Evaluation  08/18/18    Authorization - Visit Number  6    Authorization - Number of Visits  16    PT Start Time  0900    PT Stop Time  0945    PT Time Calculation (min)  45 min    Activity Tolerance  Patient tolerated treatment well    Behavior During Therapy  [facility] for tasks assessed/performed       Past Medical History:  Diagnosis Date  . Diabetes mellitus without complication ([place]) 7902   insulin started in 2012  . Hyperlipemia 2013  . Hypertension 2010    Past Surgical History:  Procedure Laterality Date  . CATARACT EXTRACTION W/PHACO  07/29/2012   Procedure: CATARACT EXTRACTION PHACO AND INTRAOCULAR LENS PLACEMENT (IOC);  Surgeon: Tonny Branch, MD;  Location: [facility];  Service: Ophthalmology;  Laterality: Left;  CDE=1.66  . CHOLECYSTECTOMY  2007   [facility]  . EYE SURGERY Left 2013   cataract  . QUADRICEPS TENDON REPAIR Right 03/19/2018   Procedure: REPAIR QUADRICEP TENDON;  Surgeon: Carole Civil, MD;  Location: [facility];  Service: Orthopedics;  Laterality: Right;    There were no vitals filed for this visit.  Subjective Assessment - 07/04/18 0901    Subjective  PT states that he feels like he is doing better.      Currently in Pain?  Yes    Pain Score  4     Pain Location  Knee    Pain Orientation  Right    Pain Descriptors / Indicators  Aching    Pain Type  Chronic pain    Pain Onset  More than a month ago    Pain Frequency  Constant    Aggravating Factors   weight bearing     Pain Relieving Factors  ice, meds.    Effect of Pain on Daily Activities   limits                        [facility] Adult PT Treatment/Exercise - 07/04/18 0001      Exercises   Exercises  Knee/Hip      Knee/Hip Exercises: Stretches   Active Hamstring Stretch  Right;3 reps;20 seconds    Quad Stretch  Right;3 reps;30 seconds      Knee/Hip Exercises: Aerobic   Stationary Bike  cycling forwards  at 20 x 5' to improve ROM       Knee/Hip Exercises: Field seismologist for Strengthening   Other Machine  bodycraft leg press 2 PL       Knee/Hip Exercises: Seated   Stool Scoot - Round Trips  2 RT       Knee/Hip Exercises: Supine   Quad Sets  Right;10 reps    Short Arc Quad Sets  Strengthening;Right;10 reps;Limitations    Short Arc Quad Sets Limitations  3#, 5# 10x each     Heel Slides  Right;10 reps    Straight Leg Raises  Right;10 reps    Straight Leg Raises Limitations  slight extension lag.  Knee/Hip Exercises: Prone   Hamstring Curl  10 reps    Hamstring Curl Limitations  5#               PT Short Term Goals - 07/04/18 0945      PT SHORT TERM GOAL #1   Title  PT pain in right knee to be no greater than a 4/10 to allow pt to stand for 30 minutes to be able to complete household duties     Time  4    Period  Weeks    Status  On-going      PT SHORT TERM GOAL #2   Title  Pt strength in both knees to improve one grade to allow pt to go up and down steps in a reciprocal manner    Time  4    Period  Weeks    Status  On-going      PT SHORT TERM GOAL #3   Title  Pt to be able to bend his right knee to 100 degrees to allow him to complete a functional squat to pick items off the ground     Time  4    Period  Weeks    Status  Achieved        PT Long Term Goals - 07/04/18 0945      PT LONG TERM GOAL #1   Title  PT pain in his right knee to be no greater than a 2/10 to allow pt to stand for 45 minutes to perform church duties.     Time  8    Period  Weeks    Status  On-going      PT LONG TERM GOAL #2   Title  Pt Rt knee ROM to  be to 120 degrees to allow him to don and doff his own shoes.     Time  8    Period  Weeks    Status  On-going      PT LONG TERM GOAL #3   Title  PT strength in both LE to be improved to a 4+/5 to allow pt to rise up from a low lying couch with ease.     Time  8    Period  Weeks    Status  On-going      PT LONG TERM GOAL #4   Title  Pt to be able to walk for over an hour to return to working out in the gym.     Time  8    Period  Weeks    Status  On-going      PT LONG TERM GOAL #5   Title  PT to be able to single leg stance on both his LE for at least 20 seconds to feel confident walking on uneven ground.     Time  8    Period  Weeks    Status  On-going            Plan - 07/04/18 1610    Clinical Impression Statement  PT continues to improve in gait, ROM and strength.  ROM improved to 0-115.  Pt has slight extension lag with SLR at this time due to weakness in terminal extension of quadriceps.  Progressed strengthening of RT LE per flowsheet.     Rehab Potential  Good    PT Frequency  2x / week    PT Duration  8 weeks    PT Treatment/Interventions  ADLs/Self Care Home Management;Therapeutic  exercise;Balance training;Gait training;Stair training;Functional mobility training;Therapeutic activities;Manual techniques;Patient/family education;Passive range of motion;Scar mobilization    PT Next Visit Plan  Due to Left knee severe OA we will need to strengthen in nonweight  positions.    PT Home Exercise Plan  eval: heel raises, LAQ, Qset, SLR, heelslides.        Patient will benefit from skilled therapeutic intervention in order to improve the following deficits and impairments:  Abnormal gait, Decreased activity tolerance, Decreased balance, Decreased range of motion, Decreased mobility, Decreased strength, Difficulty walking, Pain  Visit Diagnosis: Stiffness of right knee, not elsewhere classified  Acute pain of right knee  Muscle weakness (generalized)  Difficulty in  walking, not elsewhere classified     Problem List Patient Active Problem List   Diagnosis Date Noted  . Quadriceps tendon rupture, right, subsequent encounter s/p repair 03/19/18   . Otitis externa of right ear 02/20/2018  . Annual physical exam 07/26/2016  . Benign hypertension 07/27/2015  . Osteoarthritis of left knee 05/05/2015  . Erectile dysfunction 10/01/2014  . Mixed hyperlipidemia 05/03/2014  . Overweight (BMI 25.0-29.9) 05/03/2014  . Type 2 diabetes mellitus with stage 2 chronic kidney disease, with long-term current use of insulin [facility]) 12/08/2013    Rayetta Humphrey, PT CLT [phone] 07/04/2018, 9:48 AM  [place], [place], [address] Phone: [phone]   Fax:  [phone]  Name: LADARRIAN ASENCIO MRN: [id number] Date of Birth: 1956-03-17

## 2018-07-09 ENCOUNTER — Ambulatory Visit (HOSPITAL_COMMUNITY): Payer: PPO | Admitting: Physical Therapy

## 2018-07-09 DIAGNOSIS — M6281 Muscle weakness (generalized): Secondary | ICD-10-CM

## 2018-07-09 DIAGNOSIS — M25661 Stiffness of right knee, not elsewhere classified: Secondary | ICD-10-CM

## 2018-07-09 DIAGNOSIS — M25561 Pain in right knee: Secondary | ICD-10-CM

## 2018-07-09 DIAGNOSIS — R262 Difficulty in walking, not elsewhere classified: Secondary | ICD-10-CM

## 2018-07-09 NOTE — Therapy (Signed)
[place], [place], [address] Phone: [phone]   Fax:  [phone]  Physical Therapy Treatment  Patient Details  Name: Travis Herrera MRN: [id number] Date of Birth: 01-03-1956 Referring Provider: Arther Abbott   Encounter Date: 07/09/2018  PT End of Session - 07/09/18 0825    Visit Number  7    Number of Visits  16    Date for PT Re-Evaluation  08/18/18    Authorization - Visit Number  7    Authorization - Number of Visits  16    PT Start Time  0820    PT Stop Time  0900    PT Time Calculation (min)  40 min    Activity Tolerance  Patient tolerated treatment well    Behavior During Therapy  [facility] for tasks assessed/performed       Past Medical History:  Diagnosis Date  . Diabetes mellitus without complication ([place]) 2992   insulin started in 2012  . Hyperlipemia 2013  . Hypertension 2010    Past Surgical History:  Procedure Laterality Date  . CATARACT EXTRACTION W/PHACO  07/29/2012   Procedure: CATARACT EXTRACTION PHACO AND INTRAOCULAR LENS PLACEMENT (IOC);  Surgeon: Tonny Branch, MD;  Location: [facility];  Service: Ophthalmology;  Laterality: Left;  CDE=1.66  . CHOLECYSTECTOMY  2007   [facility]  . EYE SURGERY Left 2013   cataract  . QUADRICEPS TENDON REPAIR Right 03/19/2018   Procedure: REPAIR QUADRICEP TENDON;  Surgeon: Carole Civil, MD;  Location: [facility];  Service: Orthopedics;  Laterality: Right;    There were no vitals filed for this visit.  Subjective Assessment - 07/09/18 0822    Subjective  Pt states he gets his injection in his Lt knee tomorrow.  Reports 3/10 pain in Rt superior knee.     Currently in Pain?  Yes    Pain Score  3     Pain Location  Knee    Pain Orientation  Right                       [facility] Adult PT Treatment/Exercise - 07/09/18 0001      Knee/Hip Exercises: Stretches   Sports administrator  Right;3 reps;30 seconds    Knee: Self-Stretch to increase  Flexion  Right;10 seconds;Limitations    Knee: Self-Stretch Limitations  10 reps      Knee/Hip Exercises: Machines for Strengthening   Other Machine  bodycraft leg press 2 PL       Knee/Hip Exercises: Seated   Stool Scoot - Round Trips  2 RT       Knee/Hip Exercises: Supine   Quad Sets  Right;10 reps    Short Arc Quad Sets  Strengthening;Right;10 reps;Limitations    Short Arc Quad Sets Limitations  15X 5# weight    Heel Slides  10 reps    Straight Leg Raises  Right;15 reps    Knee Extension  Right;AROM    Knee Extension Limitations  1    Knee Flexion  Right;AROM    Knee Flexion Limitations  115      Knee/Hip Exercises: Prone   Hamstring Curl  15 reps    Hamstring Curl Limitations  5#      Manual Therapy   Manual Therapy  Myofascial release    Manual therapy comments  completed seperately from all other skilled interventions    Myofascial Release  to Rt quad and perimeter of knee to reduce  adhesions and improve ROM               PT Short Term Goals - 07/04/18 0945      PT SHORT TERM GOAL #1   Title  PT pain in right knee to be no greater than a 4/10 to allow pt to stand for 30 minutes to be able to complete household duties     Time  4    Period  Weeks    Status  On-going      PT SHORT TERM GOAL #2   Title  Pt strength in both knees to improve one grade to allow pt to go up and down steps in a reciprocal manner    Time  4    Period  Weeks    Status  On-going      PT SHORT TERM GOAL #3   Title  Pt to be able to bend his right knee to 100 degrees to allow him to complete a functional squat to pick items off the ground     Time  4    Period  Weeks    Status  Achieved        PT Long Term Goals - 07/04/18 0945      PT LONG TERM GOAL #1   Title  PT pain in his right knee to be no greater than a 2/10 to allow pt to stand for 45 minutes to perform church duties.     Time  8    Period  Weeks    Status  On-going      PT LONG TERM GOAL #2   Title  Pt Rt knee  ROM to be to 120 degrees to allow him to don and doff his own shoes.     Time  8    Period  Weeks    Status  On-going      PT LONG TERM GOAL #3   Title  PT strength in both LE to be improved to a 4+/5 to allow pt to rise up from a low lying couch with ease.     Time  8    Period  Weeks    Status  On-going      PT LONG TERM GOAL #4   Title  Pt to be able to walk for over an hour to return to working out in the gym.     Time  8    Period  Weeks    Status  On-going      PT LONG TERM GOAL #5   Title  PT to be able to single leg stance on both his LE for at least 20 seconds to feel confident walking on uneven ground.     Time  8    Period  Weeks    Status  On-going            Plan - 07/09/18 1409    Clinical Impression Statement  Continued focus on AROM with increase of 1-115 degrees today following myofascial to scar and perimeter.  Pt with continued improvement in functional strength and ROM of Rt LE.  Lt LE prohibits pateint from completing stair negotiation reciprocally and gait without AD at this time.      Rehab Potential  Good    PT Frequency  2x / week    PT Duration  8 weeks    PT Treatment/Interventions  ADLs/Self Care Home Management;Therapeutic exercise;Balance training;Gait training;Stair training;Functional mobility training;Therapeutic activities;Manual techniques;Patient/family  education;Passive range of motion;Scar mobilization    PT Next Visit Plan  Midpoint progress note to be completed.  Discuss further therapy as related to progress made.     PT Home Exercise Plan  eval: heel raises, LAQ, Qset, SLR, heelslides.        Patient will benefit from skilled therapeutic intervention in order to improve the following deficits and impairments:  Abnormal gait, Decreased activity tolerance, Decreased balance, Decreased range of motion, Decreased mobility, Decreased strength, Difficulty walking, Pain  Visit Diagnosis: Stiffness of right knee, not elsewhere  classified  Acute pain of right knee  Muscle weakness (generalized)  Difficulty in walking, not elsewhere classified     Problem List Patient Active Problem List   Diagnosis Date Noted  . Quadriceps tendon rupture, right, subsequent encounter s/p repair 03/19/18   . Otitis externa of right ear 02/20/2018  . Annual physical exam 07/26/2016  . Benign hypertension 07/27/2015  . Osteoarthritis of left knee 05/05/2015  . Erectile dysfunction 10/01/2014  . Mixed hyperlipidemia 05/03/2014  . Overweight (BMI 25.0-29.9) 05/03/2014  . Type 2 diabetes mellitus with stage 2 chronic kidney disease, with long-term current use of insulin ([place]) 12/08/2013   Teena Irani, PTA/CLT [phone]  Teena Irani 07/09/2018, 2:14 PM  [place], [place], [address] Phone: [phone]   Fax:  [phone]  Name: Travis Herrera MRN: [id number] Date of Birth: 1955-12-29

## 2018-07-10 ENCOUNTER — Telehealth (HOSPITAL_COMMUNITY): Payer: Self-pay | Admitting: Family Medicine

## 2018-07-10 ENCOUNTER — Encounter: Payer: Self-pay | Admitting: Orthopedic Surgery

## 2018-07-10 ENCOUNTER — Encounter (HOSPITAL_COMMUNITY): Payer: Self-pay | Admitting: Physical Therapy

## 2018-07-10 ENCOUNTER — Ambulatory Visit: Payer: PPO | Admitting: Orthopedic Surgery

## 2018-07-10 ENCOUNTER — Encounter

## 2018-07-10 VITALS — BP 119/67 | HR 74 | Ht 70.0 in | Wt 212.0 lb

## 2018-07-10 DIAGNOSIS — S76111D Strain of right quadriceps muscle, fascia and tendon, subsequent encounter: Secondary | ICD-10-CM | POA: Diagnosis not present

## 2018-07-10 DIAGNOSIS — M1712 Unilateral primary osteoarthritis, left knee: Secondary | ICD-10-CM

## 2018-07-10 NOTE — Therapy (Signed)
[place], [place], [address] Phone: [phone]   Fax:  [phone]  Patient Details  Name: Travis Herrera MRN: [id number] Date of Birth: [birth year] Referring Provider:  Aline Brochure Encounter Date: 07/10/2018   PHYSICAL THERAPY DISCHARGE SUMMARY  Visits from Start of Care: 7 PT comes to the office after MD visit.  States MD stated to stop therapy at this time as he has received an injection in his opposite knee.  Current functional level related to goals / functional outcomes:  ROM WFL; improved strength  Remaining deficits: Pain, swelling and continued weakness    Education / Equipment: HEP Plan: Patient agrees to discharge.  Patient goals were partially met. Patient is being discharged due to the physician's request.  ?????      Rayetta Humphrey, PT CLT [phone] 07/10/2018, 9:38 AM  [place] [address] [place], [place], [address] Phone: [phone]   Fax:  [phone]

## 2018-07-10 NOTE — Progress Notes (Signed)
Chief Complaint  Patient presents with  . Knee Pain    left knee painful     62 year old male with chronic left knee pain was scheduled for left total knee but tore his right quadriceps tendon  His right knee is now bending 115 degrees he does complain of some swelling at night  His left knee is painful along the lateral joint line no history of recent trauma  Review of systems patient does complain of intermittent chills at night says he used to be hot at night now he is cold he has not had any fever  BP 119/67   Pulse 74   Ht 5\' 10"  (1.778 m)   Wt 212 lb (96.2 kg)   BMI 30.42 kg/m  Physical Exam  Constitutional: He is oriented to person, place, and time. He appears well-developed and well-nourished.  Vital signs have been reviewed and are stable. Gen. appearance the patient is well-developed and well-nourished with normal grooming and hygiene.   Cardiovascular:  Pulses:      Dorsalis pedis pulses are 2+ on the right side, and 2+ on the left side.  Musculoskeletal:       Left knee: He exhibits decreased range of motion, effusion and abnormal alignment. He exhibits no swelling, no ecchymosis, no LCL laxity, normal meniscus and no MCL laxity. Tenderness found. Lateral joint line tenderness noted.       Legs: Neurological: He is alert and oriented to person, place, and time. Gait abnormal.  Slight limp on gait favors the left side  Skin: Skin is warm and dry. No erythema.  Psychiatric: He has a normal mood and affect.  Vitals reviewed.  Encounter Diagnoses  Name Primary?  . Quadriceps tendon rupture, right, subsequent encounter s/p repair 03/19/18   . Primary osteoarthritis of left knee Yes    Procedure note left knee injection verbal consent was obtained to inject left knee joint  Timeout was completed to confirm the site of injection  The medications used were 40 mg of Depo-Medrol and 1% lidocaine 3 cc  Anesthesia was provided by ethyl chloride and the skin was prepped  with alcohol.  After cleaning the skin with alcohol a 20-gauge needle was used to inject the left knee joint. There were no complications. A sterile bandage was applied.  Follow-up as needed okay to get injections every 2 months if requested

## 2018-07-10 NOTE — Patient Instructions (Signed)

## 2018-07-10 NOTE — Telephone Encounter (Signed)
07/10/18  pt stopped by the office and said he just saw Dr. Aline Brochure and he told him to stop therapy

## 2018-07-11 ENCOUNTER — Ambulatory Visit (HOSPITAL_COMMUNITY): Payer: PPO | Admitting: Physical Therapy

## 2018-07-16 ENCOUNTER — Encounter (HOSPITAL_COMMUNITY): Payer: PPO | Admitting: Physical Therapy

## 2018-07-18 ENCOUNTER — Encounter (HOSPITAL_COMMUNITY): Payer: PPO | Admitting: Physical Therapy

## 2018-07-19 ENCOUNTER — Ambulatory Visit (INDEPENDENT_AMBULATORY_CARE_PROVIDER_SITE_OTHER): Payer: PPO

## 2018-07-19 DIAGNOSIS — Z23 Encounter for immunization: Secondary | ICD-10-CM | POA: Diagnosis not present

## 2018-07-22 ENCOUNTER — Other Ambulatory Visit: Payer: Self-pay | Admitting: Family Medicine

## 2018-07-23 ENCOUNTER — Encounter (HOSPITAL_COMMUNITY): Payer: PPO | Admitting: Physical Therapy

## 2018-07-24 ENCOUNTER — Ambulatory Visit: Payer: PPO | Admitting: Family Medicine

## 2018-07-25 ENCOUNTER — Encounter (HOSPITAL_COMMUNITY): Payer: PPO | Admitting: Physical Therapy

## 2018-07-26 ENCOUNTER — Encounter: Payer: Self-pay | Admitting: Orthopedic Surgery

## 2018-07-26 ENCOUNTER — Ambulatory Visit (INDEPENDENT_AMBULATORY_CARE_PROVIDER_SITE_OTHER): Payer: PPO | Admitting: Orthopedic Surgery

## 2018-07-26 VITALS — BP 105/69 | HR 77 | Ht 70.0 in | Wt 213.0 lb

## 2018-07-26 DIAGNOSIS — M1712 Unilateral primary osteoarthritis, left knee: Secondary | ICD-10-CM | POA: Diagnosis not present

## 2018-07-26 DIAGNOSIS — S76111D Strain of right quadriceps muscle, fascia and tendon, subsequent encounter: Secondary | ICD-10-CM

## 2018-07-26 NOTE — Progress Notes (Signed)
POST OP VISIT   Patient ID: Travis Herrera, male   DOB: [birth year], 62 y.o.   MRN: [id number]  Chief Complaint  Patient presents with  . Knee Pain    Left knee     Encounter Diagnosis  Name Primary?  . Quadriceps tendon rupture, right, subsequent encounter s/p repair 03/19/18 Yes    Mr. Mathieson is not doing as well as would like with his left knee injection he has osteoarthritis with planned total knee replacement until the ruptured his extensor mechanism.  His left knee is still hurting  His quadriceps tendon rupture has improved he still using a cane he is flexing 120 degrees he wants to have surgery next year we will see him at that time

## 2018-08-06 ENCOUNTER — Encounter: Payer: Self-pay | Admitting: Family Medicine

## 2018-08-06 ENCOUNTER — Ambulatory Visit (INDEPENDENT_AMBULATORY_CARE_PROVIDER_SITE_OTHER): Payer: PPO | Admitting: Family Medicine

## 2018-08-06 VITALS — BP 114/78 | HR 70 | Resp 16 | Ht 70.0 in | Wt 211.1 lb

## 2018-08-06 DIAGNOSIS — I10 Essential (primary) hypertension: Secondary | ICD-10-CM | POA: Diagnosis not present

## 2018-08-06 DIAGNOSIS — E66811 Obesity, class 1: Secondary | ICD-10-CM

## 2018-08-06 DIAGNOSIS — Z794 Long term (current) use of insulin: Secondary | ICD-10-CM

## 2018-08-06 DIAGNOSIS — E669 Obesity, unspecified: Secondary | ICD-10-CM

## 2018-08-06 DIAGNOSIS — E782 Mixed hyperlipidemia: Secondary | ICD-10-CM | POA: Diagnosis not present

## 2018-08-06 DIAGNOSIS — N182 Chronic kidney disease, stage 2 (mild): Secondary | ICD-10-CM

## 2018-08-06 DIAGNOSIS — E1122 Type 2 diabetes mellitus with diabetic chronic kidney disease: Secondary | ICD-10-CM | POA: Diagnosis not present

## 2018-08-06 DIAGNOSIS — N528 Other male erectile dysfunction: Secondary | ICD-10-CM | POA: Diagnosis not present

## 2018-08-06 MED ORDER — SILDENAFIL CITRATE 100 MG PO TABS: 100.0000 mg | ORAL_TABLET | Freq: Every day | ORAL | 5 refills | Status: DC | PRN

## 2018-08-06 NOTE — Assessment & Plan Note (Signed)
Unchanged, rx for viagra 100 mg as needed, uses 50 mg

## 2018-08-06 NOTE — Assessment & Plan Note (Signed)
Managed by endo and excellent control Travis Herrera is reminded of the importance of commitment to daily physical activity for 30 minutes or more, as able and the need to limit carbohydrate intake to 30 to 60 grams per meal to help with blood sugar control.   The need to take medication as prescribed, test blood sugar as directed, and to call between visits if there is a concern that blood sugar is uncontrolled is also discussed.   Travis Herrera is reminded of the importance of daily foot exam, annual eye examination, and good blood sugar, blood pressure and cholesterol control.  Diabetic Labs Latest Ref Rng & Units 06/03/2018 02/26/2018 10/24/2017 07/03/2017 03/14/2017  HbA1c <5.7 % of total Hgb 6.3(H) 7.0(H) 7.0(H) 7.8(H) 7.7(H)  Microalbumin Not estab mg/dL - - - - -  Micro/Creat Ratio <30 mcg/mg creat - - - - -  Chol <200 mg/dL - 131 131 - -  HDL >40 mg/dL - 33(L) 33(L) - -  Calc LDL mg/dL (calc) - 80 82 - -  Triglycerides <150 mg/dL - 96 77 - -  Creatinine 0.70 - 1.33 mg/dL 1.53(H) 1.48(H) 1.35(H) 1.38(H) 1.44(H)   BP/Weight 08/06/2018 07/26/2018 07/10/2018 06/24/2018 06/14/2018 1/76/1607 12/20/1060  Systolic BP 694 854 627 035 009 381 829  Diastolic BP 78 69 67 78 73 78 87  Wt. (Lbs) 211.12 213 212 202.8 212 212 206  BMI 30.29 30.56 30.42 29.1 30.42 30.42 29.56   Foot/eye exam completion dates Latest Ref Rng & Units 02/20/2018 01/18/2017  Eye Exam No Retinopathy - -  Foot Form Completion - Done Done

## 2018-08-06 NOTE — Progress Notes (Signed)
Travis Herrera     MRN: [id number]      DOB: [birth year]   HPI Travis Herrera is here for follow up and re-evaluation of chronic medical conditions, medication management and review of any available recent lab and radiology data.  Preventive health is updated, specifically  Cancer screening and Immunization.  Refuses pneumonia vaccine due to bad reaction in the past Questions or concerns regarding consultations or procedures which the PT has had in the interim are  addressed. The PT reports  adverse reactions to opioid where he hallucinated and became violent physically potentially and verbally Denies polyuria, polydipsia, blurred vision , or hypoglycemic episodes.   ROS Denies recent fever or chills. Denies sinus pressure, nasal congestion, ear pain or sore throat. Denies chest congestion, productive cough or wheezing. Denies chest pains, palpitations and leg swelling Denies abdominal pain, nausea, vomiting,diarrhea or constipation.   Denies dysuria, frequency, hesitancy or incontinence. C/o pai and stiffness in right knee Denies headaches, seizures, numbness, or tingling. Denies depression, anxiety or insomnia. Denies skin break down or rash.   PE  BP 114/78   Pulse 70   Resp 16   Ht 5\' 10"  (1.778 m)   Wt 211 lb 1.9 oz (95.8 kg)   SpO2 99%   BMI 30.29 kg/m   Patient alert and oriented and in no cardiopulmonary distress.  HEENT: No facial asymmetry, EOMI,   oropharynx pink and moist.  Neck supple no JVD, no mass.  Chest: Clear to auscultation bilaterally.  CVS: S1, S2 no murmurs, no S3.Regular rate.  ABD: Soft non tender.   Ext: No edema  MS: Adequate ROM spine, shoulders, hips and reduced in  knees.  Skin: Intact, no ulcerations or rash noted.  Psych: Good eye contact, normal affect. Memory intact not anxious or depressed appearing.  CNS: CN 2-12 intact, power,  normal throughout.no focal deficits noted.   Assessment & Plan  Type 2 diabetes mellitus with  stage 2 chronic kidney disease, with long-term current use of insulin ([facility]) Managed by endo and excellent control Travis Herrera is reminded of the importance of commitment to daily physical activity for 30 minutes or more, as able and the need to limit carbohydrate intake to 30 to 60 grams per meal to help with blood sugar control.   The need to take medication as prescribed, test blood sugar as directed, and to call between visits if there is a concern that blood sugar is uncontrolled is also discussed.   Travis Herrera is reminded of the importance of daily foot exam, annual eye examination, and good blood sugar, blood pressure and cholesterol control.  Diabetic Labs Latest Ref Rng & Units 06/03/2018 02/26/2018 10/24/2017 07/03/2017 03/14/2017  HbA1c <5.7 % of total Hgb 6.3(H) 7.0(H) 7.0(H) 7.8(H) 7.7(H)  Microalbumin Not estab mg/dL - - - - -  Micro/Creat Ratio <30 mcg/mg creat - - - - -  Chol <200 mg/dL - 131 131 - -  HDL >40 mg/dL - 33(L) 33(L) - -  Calc LDL mg/dL (calc) - 80 82 - -  Triglycerides <150 mg/dL - 96 77 - -  Creatinine 0.70 - 1.33 mg/dL 1.53(H) 1.48(H) 1.35(H) 1.38(H) 1.44(H)   BP/Weight 08/06/2018 07/26/2018 07/10/2018 06/24/2018 06/14/2018 2/77/8242 12/18/3612  Systolic BP 431 540 086 761 950 932 671  Diastolic BP 78 69 67 78 73 78 87  Wt. (Lbs) 211.12 213 212 202.8 212 212 206  BMI 30.29 30.56 30.42 29.1 30.42 30.42 29.56   Foot/eye exam completion dates Latest  Ref Rng & Units 02/20/2018 01/18/2017  Eye Exam No Retinopathy - -  Foot Form Completion - Done Done        Erectile dysfunction Unchanged, rx for viagra 100 mg as needed, uses 50 mg  Obesity (BMI 30.0-34.9) Unchanged Patient re-educated about  the importance of commitment to a  minimum of 150 minutes of exercise per week.  The importance of healthy food choices with portion control discussed. Encouraged to start a food diary, count calories and to consider  joining a support group. Sample diet sheets offered. Goals set  by the patient for the next several months.   Weight /BMI 08/06/2018 07/26/2018 07/10/2018  WEIGHT 211 lb 1.9 oz 213 lb 212 lb  HEIGHT 5\' 10"  5\' 10"  5\' 10"   BMI 30.29 kg/m2 30.56 kg/m2 30.42 kg/m2      Mixed hyperlipidemia Hyperlipidemia:Low fat diet discussed and encouraged.   Lipid Panel  Lab Results  Component Value Date   CHOL 131 02/26/2018   HDL 33 (L) 02/26/2018   LDLCALC 80 02/26/2018   TRIG 96 02/26/2018   CHOLHDL 4.0 02/26/2018  Updated lab needed at/ before next visit.

## 2018-08-06 NOTE — Assessment & Plan Note (Signed)
Hyperlipidemia:Low fat diet discussed and encouraged.   Lipid Panel  Lab Results  Component Value Date   CHOL 131 02/26/2018   HDL 33 (L) 02/26/2018   LDLCALC 80 02/26/2018   TRIG 96 02/26/2018   CHOLHDL 4.0 02/26/2018  Updated lab needed at/ before next visit.

## 2018-08-06 NOTE — Patient Instructions (Signed)
Annual physical exam May 21 or after, call if you need me before  Congrats on excellent blood sugar with eating change   Fasting lipid, cmp abnd EGFR  Novemeber 15 or shortly after.  Make sure that you drink a lot of water    Please continue to work on therapy , you will continue to heal

## 2018-08-06 NOTE — Assessment & Plan Note (Signed)
Unchanged Patient re-educated about  the importance of commitment to a  minimum of 150 minutes of exercise per week.  The importance of healthy food choices with portion control discussed. Encouraged to start a food diary, count calories and to consider  joining a support group. Sample diet sheets offered. Goals set by the patient for the next several months.   Weight /BMI 08/06/2018 07/26/2018 07/10/2018  WEIGHT 211 lb 1.9 oz 213 lb 212 lb  HEIGHT 5\' 10"  5\' 10"  5\' 10"   BMI 30.29 kg/m2 30.56 kg/m2 30.42 kg/m2

## 2018-08-07 ENCOUNTER — Other Ambulatory Visit: Payer: Self-pay

## 2018-08-07 ENCOUNTER — Telehealth: Payer: Self-pay | Admitting: Family Medicine

## 2018-08-07 MED ORDER — SILDENAFIL CITRATE 100 MG PO TABS: 100.0000 mg | ORAL_TABLET | Freq: Every day | ORAL | 5 refills | Status: DC | PRN

## 2018-08-07 NOTE — Telephone Encounter (Signed)
Spoke with patient. He stated he received medication.

## 2018-08-07 NOTE — Telephone Encounter (Signed)
Pt called and said that the medicine is not at [facility] --please call in sildenafil

## 2018-08-30 DIAGNOSIS — I10 Essential (primary) hypertension: Secondary | ICD-10-CM | POA: Diagnosis not present

## 2018-08-30 DIAGNOSIS — E782 Mixed hyperlipidemia: Secondary | ICD-10-CM | POA: Diagnosis not present

## 2018-08-30 LAB — COMPLETE METABOLIC PANEL WITH GFR
AG RATIO: 1.4 (calc) (ref 1.0–2.5)
ALBUMIN MSPROF: 4.3 g/dL (ref 3.6–5.1)
ALT: 16 U/L (ref 9–46)
AST: 17 U/L (ref 10–35)
Alkaline phosphatase (APISO): 81 U/L (ref 40–115)
BUN/Creatinine Ratio: 19 (calc) (ref 6–22)
BUN: 36 mg/dL — ABNORMAL HIGH (ref 7–25)
CALCIUM: 9.4 mg/dL (ref 8.6–10.3)
CO2: 27 mmol/L (ref 20–32)
CREATININE: 1.93 mg/dL — AB (ref 0.70–1.33)
Chloride: 106 mmol/L (ref 98–110)
GFR, EST NON AFRICAN AMERICAN: 37 mL/min/{1.73_m2} — AB (ref >=60)
GFR, Est African American: 43 mL/min/{1.73_m2} — ABNORMAL LOW (ref >=60)
GLOBULIN: 3.1 g/dL (ref 1.9–3.7)
Glucose, Bld: 104 mg/dL — ABNORMAL HIGH (ref 65–99)
POTASSIUM: 4.5 mmol/L (ref 3.5–5.3)
SODIUM: 140 mmol/L (ref 135–146)
Total Bilirubin: 0.3 mg/dL (ref 0.2–1.2)
Total Protein: 7.4 g/dL (ref 6.1–8.1)

## 2018-08-30 LAB — LIPID PANEL
CHOL/HDL RATIO: 4.6 (calc) (ref <5.0)
CHOLESTEROL: 143 mg/dL (ref <200)
HDL: 31 mg/dL — AB (ref >40)
LDL CHOLESTEROL (CALC): 91 mg/dL
Non-HDL Cholesterol (Calc): 112 mg/dL (calc) (ref <130)
Triglycerides: 116 mg/dL (ref <150)

## 2018-09-02 ENCOUNTER — Telehealth: Payer: Self-pay

## 2018-09-02 DIAGNOSIS — N182 Chronic kidney disease, stage 2 (mild): Principal | ICD-10-CM

## 2018-09-02 DIAGNOSIS — Z794 Long term (current) use of insulin: Principal | ICD-10-CM

## 2018-09-02 DIAGNOSIS — E1122 Type 2 diabetes mellitus with diabetic chronic kidney disease: Secondary | ICD-10-CM

## 2018-09-02 NOTE — Telephone Encounter (Signed)
-----   Message from Fayrene Helper, MD sent at 09/01/2018  6:35 AM EST ----- Your cholesterol and liver function are good You need to increase water intake. Please ensure you drink 64 ounces water daily You need to repeat non fasting kidney function test first week in December, the order is mailed to you.If your  Kidney function is not improved, I will refer you to a kidney specialist

## 2018-09-18 ENCOUNTER — Other Ambulatory Visit: Payer: Self-pay | Admitting: Family Medicine

## 2018-10-21 DIAGNOSIS — H11421 Conjunctival edema, right eye: Secondary | ICD-10-CM | POA: Diagnosis not present

## 2018-12-06 DIAGNOSIS — N182 Chronic kidney disease, stage 2 (mild): Secondary | ICD-10-CM | POA: Diagnosis not present

## 2018-12-06 DIAGNOSIS — E1122 Type 2 diabetes mellitus with diabetic chronic kidney disease: Secondary | ICD-10-CM | POA: Diagnosis not present

## 2018-12-06 DIAGNOSIS — Z794 Long term (current) use of insulin: Secondary | ICD-10-CM | POA: Diagnosis not present

## 2018-12-07 LAB — COMPLETE METABOLIC PANEL WITHOUT GFR
AG Ratio: 1.5 (calc) (ref 1.0–2.5)
ALT: 15 U/L (ref 9–46)
AST: 16 U/L (ref 10–35)
Albumin: 4.1 g/dL (ref 3.6–5.1)
Alkaline phosphatase (APISO): 98 U/L (ref 35–144)
BUN/Creatinine Ratio: 17 (calc) (ref 6–22)
BUN: 30 mg/dL — ABNORMAL HIGH (ref 7–25)
CO2: 27 mmol/L (ref 20–32)
Calcium: 9.3 mg/dL (ref 8.6–10.3)
Chloride: 105 mmol/L (ref 98–110)
Creat: 1.81 mg/dL — ABNORMAL HIGH (ref 0.70–1.25)
GFR, Est African American: 46 mL/min/1.73m2 — ABNORMAL LOW (ref >=60)
GFR, Est Non African American: 40 mL/min/1.73m2 — ABNORMAL LOW (ref >=60)
Globulin: 2.8 g/dL (ref 1.9–3.7)
Glucose, Bld: 121 mg/dL — ABNORMAL HIGH (ref 65–99)
Potassium: 4.4 mmol/L (ref 3.5–5.3)
Sodium: 138 mmol/L (ref 135–146)
Total Bilirubin: 0.3 mg/dL (ref 0.2–1.2)
Total Protein: 6.9 g/dL (ref 6.1–8.1)

## 2018-12-07 LAB — T4, FREE: Free T4: 1.3 ng/dL (ref 0.8–1.8)

## 2018-12-07 LAB — HEMOGLOBIN A1C
Hgb A1c MFr Bld: 7.5 % of total Hgb — ABNORMAL HIGH (ref <5.7)
Mean Plasma Glucose: 169 (calc)
eAG (mmol/L): 9.3 (calc)

## 2018-12-07 LAB — MICROALBUMIN / CREATININE URINE RATIO
CREATININE, URINE: 136 mg/dL (ref 20–320)
MICROALB UR: 1.3 mg/dL
MICROALB/CREAT RATIO: 10 ug/mg{creat} (ref <30)

## 2018-12-07 LAB — TSH: TSH: 1.96 m[IU]/L (ref 0.40–4.50)

## 2018-12-07 LAB — VITAMIN D 25 HYDROXY (VIT D DEFICIENCY, FRACTURES): Vit D, 25-Hydroxy: 17 ng/mL — ABNORMAL LOW (ref 30–100)

## 2018-12-13 ENCOUNTER — Ambulatory Visit: Payer: PPO | Admitting: "Endocrinology

## 2018-12-13 ENCOUNTER — Encounter: Payer: Self-pay | Admitting: "Endocrinology

## 2018-12-13 VITALS — BP 114/73 | HR 78 | Resp 12 | Ht 70.0 in | Wt 220.1 lb

## 2018-12-13 DIAGNOSIS — E1122 Type 2 diabetes mellitus with diabetic chronic kidney disease: Secondary | ICD-10-CM | POA: Diagnosis not present

## 2018-12-13 DIAGNOSIS — N182 Chronic kidney disease, stage 2 (mild): Secondary | ICD-10-CM | POA: Diagnosis not present

## 2018-12-13 DIAGNOSIS — Z794 Long term (current) use of insulin: Secondary | ICD-10-CM | POA: Diagnosis not present

## 2018-12-13 DIAGNOSIS — I10 Essential (primary) hypertension: Secondary | ICD-10-CM | POA: Diagnosis not present

## 2018-12-13 DIAGNOSIS — E782 Mixed hyperlipidemia: Secondary | ICD-10-CM | POA: Diagnosis not present

## 2018-12-13 MED ORDER — INSULIN GLARGINE 100 UNIT/ML SOLOSTAR PEN: 54.0000 [IU] | PEN_INJECTOR | Freq: Every day | SUBCUTANEOUS | 2 refills | Status: DC

## 2018-12-13 NOTE — Progress Notes (Signed)
Endocrinology follow-up note   Subjective:    Patient ID: Travis Herrera, male    DOB: 1956/01/17,    Past Medical History:  Diagnosis Date  . Diabetes mellitus without complication ([place]) 7253   insulin started in 2012  . Hyperlipemia 2013  . Hypertension 2010   Past Surgical History:  Procedure Laterality Date  . CATARACT EXTRACTION W/PHACO  07/29/2012   Procedure: CATARACT EXTRACTION PHACO AND INTRAOCULAR LENS PLACEMENT (IOC);  Surgeon: Tonny Branch, MD;  Location: [facility];  Service: Ophthalmology;  Laterality: Left;  CDE=1.66  . CHOLECYSTECTOMY  2007   [facility]  . EYE SURGERY Left 2013   cataract  . QUADRICEPS TENDON REPAIR Right 03/19/2018   Procedure: REPAIR QUADRICEP TENDON;  Surgeon: Carole Civil, MD;  Location: [facility];  Service: Orthopedics;  Laterality: Right;   Social History   Socioeconomic History  . Marital status: Married    Spouse name: Not on file  . Number of children: 4  . Years of education: Not on file  . Highest education level: Not on file  Occupational History  . Not on file  Social Needs  . Financial resource strain: Not hard at all  . Food insecurity:    Worry: Never true    Inability: Never true  . Transportation needs:    Medical: Yes    Non-medical: Yes  Tobacco Use  . Smoking status: Never Smoker  . Smokeless tobacco: Never Used  Substance and Sexual Activity  . Alcohol use: No  . Drug use: No  . Sexual activity: Yes    Birth control/protection: None  Lifestyle  . Physical activity:    Days per week: 0 days    Minutes per session: 0 min  . Stress: Not at all  Relationships  . Social connections:    Talks on phone: More than three times a week    Gets together: More than three times a week    Attends religious service: 1 to 4 times per year    Active member of club or organization: No    Attends meetings of clubs or organizations: Never    Relationship status: Married  Other Topics Concern  . Not on file  Social  History Narrative  . Not on file   Outpatient Encounter Medications as of 12/13/2018  Medication Sig  . amLODipine (NORVASC) 10 MG tablet TAKE 1 TABLET BY MOUTH ONCE DAILY  . aspirin EC 81 MG tablet Take 81 mg by mouth daily.  . benazepril (LOTENSIN) 20 MG tablet TAKE 1 TABLET BY MOUTH ONCE DAILY  . glucose blood (ONETOUCH VERIO) test strip One touch verio strips- three times daily testing  . Insulin Glargine (LANTUS SOLOSTAR) 100 UNIT/ML Solostar Pen Inject 54 Units into the skin daily with breakfast.  . Insulin Pen Needle 31G X 8 MM MISC 1 each by Does not apply route 2 (two) times daily.  . Lancets (FREESTYLE) lancets Use as instructed bid  . liraglutide (VICTOZA) 18 MG/3ML SOPN INJECT 1.8 MG INTO THE SKIN DAILY AS DIRECTED (Patient taking differently: Inject 1.8 mg into the skin every morning. INJECT 1.8 MG INTO THE SKIN DAILY AS DIRECTED)  . lovastatin (MEVACOR) 20 MG tablet TAKE 1 TABLET BY MOUTH AT BEDTIME  . ONETOUCH DELICA LANCETS 66Y MISC Three times daily testing dx e11.65  . Saw Palmetto 1000 MG CAPS Take 1,000 mg by mouth daily.  . sildenafil (VIAGRA) 100 MG tablet Take 1 tablet (100 mg total) by mouth daily  as needed for erectile dysfunction.  . triamterene-hydrochlorothiazide (MAXZIDE-25) 37.5-25 MG tablet TAKE 1 TABLET BY MOUTH ONCE DAILY  . [DISCONTINUED] LANTUS SOLOSTAR 100 UNIT/ML Solostar Pen INJECT 50 UNITS SUBCUTANEOUSLY ONCE DAILY IN THE MORNING  . [DISCONTINUED] Omega-3 Fatty Acids (FISH OIL) 1000 MG CAPS Take 1,000 mg by mouth daily.    No facility-administered encounter medications on file as of 12/13/2018.    ALLERGIES: No Known Allergies VACCINATION STATUS: Immunization History  Administered Date(s) Administered  . Influenza Split 07/17/2014  . Influenza,inj,Quad PF,6+ Mos 08/16/2015, 07/26/2016, 08/02/2017, 07/19/2018    Diabetes  He presents for his follow-up diabetic visit. He has type 2 diabetes mellitus. Onset time: He was diagnosed at approximate age  of 49 years. His disease course has been worsening. There are no hypoglycemic associated symptoms. Pertinent negatives for hypoglycemia include no confusion, headaches, pallor or seizures. There are no diabetic associated symptoms. Pertinent negatives for diabetes include no chest pain, no fatigue, no polydipsia, no polyphagia, no polyuria and no weakness. There are no hypoglycemic complications. Symptoms are worsening. Diabetic complications include nephropathy. Risk factors for coronary artery disease include diabetes mellitus, dyslipidemia, hypertension, family history, male sex and sedentary lifestyle. Current diabetic treatments: He is on Lantus 50 units daily at breakfast and Victoza 1.8 mg subcutaneously daily. He is compliant with treatment most of the time. His weight is increasing steadily. He is following a generally unhealthy diet. He rarely participates in exercise. His home blood glucose trend is increasing steadily. His breakfast blood glucose range is generally 140-180 mg/dl. His bedtime blood glucose range is generally 180-200 mg/dl. His overall blood glucose range is 140-180 mg/dl. An ACE inhibitor/angiotensin II receptor blocker is being taken. Eye exam is current.  Hypertension  This is a chronic problem. The current episode started more than 1 year ago. The problem has been gradually improving since onset. The problem is controlled. Pertinent negatives include no chest pain, headaches, neck pain, palpitations or shortness of breath. Risk factors for coronary artery disease include dyslipidemia, diabetes mellitus and male gender. Past treatments include ACE inhibitors. There are no compliance problems.   Hyperlipidemia  This is a chronic problem. The current episode started more than 1 year ago. Exacerbating diseases include diabetes. Pertinent negatives include no chest pain, myalgias or shortness of breath. Current antihyperlipidemic treatment includes statins. Risk factors for coronary  artery disease include dyslipidemia, diabetes mellitus, hypertension and male sex.    Review of Systems  Constitutional: Negative for fatigue and unexpected weight change.  HENT: Negative for dental problem, mouth sores and trouble swallowing.   Eyes: Negative for visual disturbance.  Respiratory: Negative for cough, choking, chest tightness, shortness of breath and wheezing.   Cardiovascular: Negative for chest pain, palpitations and leg swelling.  Gastrointestinal: Negative for abdominal distention, abdominal pain, constipation, diarrhea, nausea and vomiting.  Endocrine: Negative for polydipsia, polyphagia and polyuria.  Genitourinary: Negative for dysuria, flank pain, hematuria and urgency.  Musculoskeletal: Negative for back pain, gait problem, myalgias and neck pain.  Skin: Negative for pallor, rash and wound.  Neurological: Negative for seizures, syncope, weakness, numbness and headaches.  Psychiatric/Behavioral: Negative.  Negative for confusion and dysphoric mood.    Objective:    BP 114/73   Pulse 78   Resp 12   Ht 5\' 10"  (1.778 m)   Wt 220 lb 1 oz (99.8 kg)   SpO2 99% Comment: room air  BMI 31.58 kg/m   Wt Readings from Last 3 Encounters:  12/13/18 220 lb 1 oz (99.8  kg)  08/06/18 211 lb 1.9 oz (95.8 kg)  07/26/18 213 lb (96.6 kg)    Physical Exam  Constitutional: He is oriented to person, place, and time. He appears well-developed and well-nourished. He is cooperative. No distress.  HENT:  Head: Normocephalic and atraumatic.  Eyes: EOM are normal.  Neck: Normal range of motion. Neck supple. No tracheal deviation present. No thyromegaly present.  Cardiovascular: Normal rate, S1 normal, S2 normal and normal heart sounds. Exam reveals no gallop.  No murmur heard. Pulses:      Dorsalis pedis pulses are 1+ on the right side and 1+ on the left side.       Posterior tibial pulses are 1+ on the right side and 1+ on the left side.  Pulmonary/Chest: Effort normal. No  respiratory distress. He has no wheezes.  Abdominal: He exhibits no distension. There is no abdominal tenderness. There is no guarding and no CVA tenderness.  Musculoskeletal:        General: No edema.     Right shoulder: He exhibits no swelling and no deformity.     Comments: He is awaiting for elective left knee replacement.  Neurological: He is alert and oriented to person, place, and time. He has normal strength. No cranial nerve deficit or sensory deficit. Gait normal.  Skin: Skin is warm and dry. No rash noted. No cyanosis. Nails show no clubbing.  Psychiatric: He has a normal mood and affect. His speech is normal. Judgment normal. Cognition and memory are normal.    Results for orders placed or performed in visit on 08/06/18  Lipid panel  Result Value Ref Range   Cholesterol 143 <200 mg/dL   HDL 31 (L) >40 mg/dL   Triglycerides 116 <150 mg/dL   LDL Cholesterol (Calc) 91 mg/dL (calc)   Total CHOL/HDL Ratio 4.6 <5.0 (calc)   Non-HDL Cholesterol (Calc) 112 <130 mg/dL (calc)  COMPLETE METABOLIC PANEL WITH GFR  Result Value Ref Range   Glucose, Bld 104 (H) 65 - 99 mg/dL   BUN 36 (H) 7 - 25 mg/dL   Creat 1.93 (H) 0.70 - 1.33 mg/dL   GFR, Est Non African American 37 (L) > OR = 60 mL/min/1.31m2   GFR, Est African American 43 (L) > OR = 60 mL/min/1.18m2   BUN/Creatinine Ratio 19 6 - 22 (calc)   Sodium 140 135 - 146 mmol/L   Potassium 4.5 3.5 - 5.3 mmol/L   Chloride 106 98 - 110 mmol/L   CO2 27 20 - 32 mmol/L   Calcium 9.4 8.6 - 10.3 mg/dL   Total Protein 7.4 6.1 - 8.1 g/dL   Albumin 4.3 3.6 - 5.1 g/dL   Globulin 3.1 1.9 - 3.7 g/dL (calc)   AG Ratio 1.4 1.0 - 2.5 (calc)   Total Bilirubin 0.3 0.2 - 1.2 mg/dL   Alkaline phosphatase (APISO) 81 40 - 115 U/L   AST 17 10 - 35 U/L   ALT 16 9 - 46 U/L   Diabetic Labs (most recent): Lab Results  Component Value Date   HGBA1C 7.5 (H) 12/06/2018   HGBA1C 6.3 (H) 06/03/2018   HGBA1C 7.0 (H) 02/26/2018   Lipid Panel     Component  Value Date/Time   CHOL 143 08/30/2018 0758   TRIG 116 08/30/2018 0758   HDL 31 (L) 08/30/2018 0758   CHOLHDL 4.6 08/30/2018 0758   VLDL 12 01/16/2017 0940   LDLCALC 91 08/30/2018 0758      Assessment & Plan:   1. diabetes  mellitus type 2 without complications, unspecified long term insulin use status [facility]) Mr. Mckowen returns with higher A1c of 7.5% increasing from 6.3%.   This is mainly due to his inactivity related to his fall injuries through the winter.      Glucose logs and insulin administration records pertaining to this visit,  to be scanned into patient's records.  Recent labs reviewed, showing mild renal insufficiency. - Patient remains at a high risk for more acute and chronic complications of diabetes which include CAD, CVA, CKD, retinopathy, and neuropathy. These are all discussed in detail with the patient.  - I have re-counseled the patient on diet management by adopting a carbohydrate restricted / protein rich  Diet. - He is advised to stick to a routine mealtimes to eat 3 meals  a day and avoid unnecessary snacks ( to snack only to correct hypoglycemia). - Patient admits there is a room for improvement in his diet and drink choices. -  Suggestion is made for him to avoid simple carbohydrates  from his diet including Cakes, Sweet Desserts / Pastries, Ice Cream, Soda (diet and regular), Sweet Tea, Candies, Chips, Cookies, Store Bought Juices, Alcohol in Excess of  1-2 drinks a day, Artificial Sweeteners, and "Sugar-free" Products. This will help patient to have stable blood glucose profile and potentially avoid unintended weight gain.  - I have approached patient with the following individualized plan to manage diabetes and patient agrees.   -Based on his presentation, he will not require prandial insulin for now.    -He is advised to increase his Lantus to 54 units daily in the morning with breakfast,   associated with strict monitoring of blood glucose 2 times a day-daily  before breakfast and at bedtime. -He is warned not to take insulin without proper monitoring per orders. -Patient is encouraged to call clinic for blood glucose levels less than 70 or above 300 mg /dl. -He will continue to benefit from Victoza treatment.  He is advised to continue continue  VICTOZA 1.8  mg subcutaneous daily before supper, therapeutically suitable for patient. -His renal function is stable since last visit, takes only Tylenol when he needs it at this time.  If renal function does not improve by next visit, he will be considered for nephrology referral.  - He is advised to avoid large doses of NSAIDs, and advised to maintain adequate hydration.    - Patient specific target  for A1c; LDL, HDL, Triglycerides, and  Waist Circumference were discussed in detail.  2) BP/HTN: His blood pressure is controlled to target.   He is advised to continue his current blood pressure medications including benazepril 20 mg p.o. daily.    3) Lipids/HPL: Recent lipid panel showed controlled LDL at 76.  He will continue to benefit from statin therapy, advised to continue lovastatin 20 mg p.o. nightly.    4)  Weight/Diet: CDE consult in progress, exercise, and carbohydrates information provided.  5) Chronic Care/Health Maintenance:  -Patient  on ACEI and Statin medications and encouraged to continue to follow up with Ophthalmology, Podiatrist at least yearly or according to recommendations, and advised to stay away from smoking. I have recommended yearly flu vaccine and pneumonia vaccination at least every 5 years; moderate intensity exercise for up to 150 minutes weekly; and  sleep for at least 7 hours a day.  I advised patient to maintain close follow up with his PCP for primary care needs.  - Time spent with the patient: 25 min, of which >  50% was spent in reviewing his blood glucose logs , discussing his hypoglycemia and hyperglycemia episodes, reviewing his current and  previous labs / studies and  medications  doses and developing a plan to avoid hypoglycemia and hyperglycemia. Please refer to Patient Instructions for Blood Glucose Monitoring and Insulin/Medications Dosing Guide"  in media tab for additional information. Travis Herrera participated in the discussions, expressed understanding, and voiced agreement with the above plans.  All questions were answered to his satisfaction. he is encouraged to contact clinic should he have any questions or concerns prior to his return visit.   Follow up plan: Return in about 6 months (around 06/13/2019) for Follow up with Pre-visit Labs, Meter, and Logs.  Glade Lloyd, MD Phone: [phone]  Fax: [phone]  This note was partially dictated with voice recognition software. Similar sounding words can be transcribed inadequately or may not  be corrected upon review.  12/13/2018, 1:16 PM

## 2018-12-13 NOTE — Patient Instructions (Signed)

## 2019-01-13 ENCOUNTER — Telehealth: Payer: Self-pay | Admitting: Orthopedic Surgery

## 2019-01-13 NOTE — Telephone Encounter (Signed)
Per patient his knee has fluid buildup again and would like to have it drained. When would you like for him to come in or do you want to speak with him?  Please advise

## 2019-01-15 NOTE — Telephone Encounter (Signed)
9:40 Friday IN PERSON

## 2019-01-17 ENCOUNTER — Encounter: Payer: Self-pay | Admitting: Orthopedic Surgery

## 2019-01-17 ENCOUNTER — Other Ambulatory Visit: Payer: Self-pay

## 2019-01-17 ENCOUNTER — Ambulatory Visit (INDEPENDENT_AMBULATORY_CARE_PROVIDER_SITE_OTHER): Payer: PPO | Admitting: Orthopedic Surgery

## 2019-01-17 VITALS — BP 120/83 | HR 70 | Temp 97.6°F | Ht 70.0 in | Wt 220.0 lb

## 2019-01-17 DIAGNOSIS — L814 Other melanin hyperpigmentation: Secondary | ICD-10-CM | POA: Diagnosis not present

## 2019-01-17 DIAGNOSIS — L820 Inflamed seborrheic keratosis: Secondary | ICD-10-CM | POA: Diagnosis not present

## 2019-01-17 DIAGNOSIS — M1712 Unilateral primary osteoarthritis, left knee: Secondary | ICD-10-CM | POA: Diagnosis not present

## 2019-01-17 DIAGNOSIS — L578 Other skin changes due to chronic exposure to nonionizing radiation: Secondary | ICD-10-CM | POA: Diagnosis not present

## 2019-01-17 DIAGNOSIS — D1801 Hemangioma of skin and subcutaneous tissue: Secondary | ICD-10-CM | POA: Diagnosis not present

## 2019-01-17 DIAGNOSIS — L570 Actinic keratosis: Secondary | ICD-10-CM | POA: Diagnosis not present

## 2019-01-17 DIAGNOSIS — M25462 Effusion, left knee: Secondary | ICD-10-CM | POA: Diagnosis not present

## 2019-01-17 DIAGNOSIS — L821 Other seborrheic keratosis: Secondary | ICD-10-CM | POA: Diagnosis not present

## 2019-01-17 DIAGNOSIS — Z1231 Encounter for screening mammogram for malignant neoplasm of breast: Secondary | ICD-10-CM | POA: Diagnosis not present

## 2019-01-17 NOTE — Patient Instructions (Signed)
Ice   rest   You have received an injection of steroids into the joint. 15% of patients will have increased pain within the 24 hours postinjection.   This is transient and will go away.   We recommend that you use ice packs on the injection site for 20 minutes every 2 hours and extra strength Tylenol 2 tablets every 8 as needed until the pain resolves.  If you continue to have pain after taking the Tylenol and using the ice please call the office for further instructions.

## 2019-01-17 NOTE — Progress Notes (Signed)
Chief Complaint  Patient presents with  . Knee Pain   63 year old male with left knee pain and effusion history of osteoarthritis with effusion responded well to aspiration injection  No fever no rash on review of systems  Physical Exam Constitutional:      Appearance: He is well-developed.  HENT:     Head: Normocephalic and atraumatic.  Eyes:     Conjunctiva/sclera: Conjunctivae normal.     Pupils: Pupils are equal, round, and reactive to light.  Neck:     Musculoskeletal: Normal range of motion and neck supple.  Cardiovascular:     Rate and Rhythm: Normal rate and regular rhythm.  Pulmonary:     Effort: Pulmonary effort is normal.  Skin:    General: Skin is warm and dry.  Neurological:     Mental Status: He is alert and oriented to person, place, and time.     Cranial Nerves: No cranial nerve deficit.     Motor: No abnormal muscle tone.     Coordination: Coordination normal.     Deep Tendon Reflexes: Reflexes are normal and symmetric. Reflexes normal.  Psychiatric:        Behavior: Behavior normal.        Thought Content: Thought content normal.        Judgment: Judgment normal.    Left knee valgus alignment  Flexion contracture 5 degrees flexion 85 degrees  Effusion  Lateral joint line tenderness medial and suprapatellar pouch soft tissue tenderness   Encounter Diagnoses  Name Primary?  . Primary osteoarthritis of left knee Yes  . Effusion of knee joint, left     Aspiration injection left knee  Procedure note injection and aspiration left knee joint  Verbal consent was obtained to aspirate and inject the left knee joint   Timeout was completed to confirm the site of aspiration and injection  An 18-gauge needle was used to aspirate the left knee joint from a suprapatellar lateral approach.  The medications used were 40 mg of Depo-Medrol and 1% lidocaine 3 cc  Anesthesia was provided by ethyl chloride and the skin was prepped with alcohol.  After  cleaning the skin with alcohol an 18-gauge needle was used to aspirate the right knee joint.  We obtained 32 cc of fluid, clear fluid  We followed this by injection of 40 mg of Depo-Medrol and 3 cc 1% lidocaine.  There were no complications. A sterile bandage was applied.   Plan ice rest follow-up as needed

## 2019-01-20 ENCOUNTER — Telehealth: Payer: Self-pay | Admitting: *Deleted

## 2019-01-20 ENCOUNTER — Other Ambulatory Visit: Payer: Self-pay

## 2019-01-20 MED ORDER — GLUCOSE BLOOD VI STRP: ORAL_STRIP | 3 refills | Status: DC

## 2019-01-20 NOTE — Telephone Encounter (Signed)
Refill sent.

## 2019-01-20 NOTE — Telephone Encounter (Signed)
[facility] faxed over prescription refill for ONETOUCHVERIO TES qty 300

## 2019-01-27 ENCOUNTER — Other Ambulatory Visit: Payer: Self-pay | Admitting: Family Medicine

## 2019-03-12 ENCOUNTER — Encounter: Payer: Self-pay | Admitting: Family Medicine

## 2019-03-12 ENCOUNTER — Other Ambulatory Visit: Payer: Self-pay

## 2019-03-12 ENCOUNTER — Ambulatory Visit (INDEPENDENT_AMBULATORY_CARE_PROVIDER_SITE_OTHER): Payer: PPO | Admitting: Family Medicine

## 2019-03-12 VITALS — BP 120/70 | HR 82 | Temp 98.6°F | Resp 12 | Ht 70.0 in | Wt 229.0 lb

## 2019-03-12 DIAGNOSIS — E1122 Type 2 diabetes mellitus with diabetic chronic kidney disease: Secondary | ICD-10-CM

## 2019-03-12 DIAGNOSIS — J302 Other seasonal allergic rhinitis: Secondary | ICD-10-CM | POA: Diagnosis not present

## 2019-03-12 DIAGNOSIS — Z0000 Encounter for general adult medical examination without abnormal findings: Secondary | ICD-10-CM

## 2019-03-12 DIAGNOSIS — Z794 Long term (current) use of insulin: Secondary | ICD-10-CM

## 2019-03-12 DIAGNOSIS — I10 Essential (primary) hypertension: Secondary | ICD-10-CM

## 2019-03-12 DIAGNOSIS — E66811 Obesity, class 1: Secondary | ICD-10-CM

## 2019-03-12 DIAGNOSIS — E119 Type 2 diabetes mellitus without complications: Secondary | ICD-10-CM

## 2019-03-12 DIAGNOSIS — Z125 Encounter for screening for malignant neoplasm of prostate: Secondary | ICD-10-CM

## 2019-03-12 DIAGNOSIS — E669 Obesity, unspecified: Secondary | ICD-10-CM

## 2019-03-12 DIAGNOSIS — IMO0001 Reserved for inherently not codable concepts without codable children: Secondary | ICD-10-CM

## 2019-03-12 DIAGNOSIS — Z1211 Encounter for screening for malignant neoplasm of colon: Secondary | ICD-10-CM

## 2019-03-12 DIAGNOSIS — N182 Chronic kidney disease, stage 2 (mild): Secondary | ICD-10-CM | POA: Diagnosis not present

## 2019-03-12 MED ORDER — MONTELUKAST SODIUM 10 MG PO TABS: 10.0000 mg | ORAL_TABLET | Freq: Every day | ORAL | 5 refills | Status: DC

## 2019-03-12 MED ORDER — PREDNISONE 10 MG PO TABS: 10.0000 mg | ORAL_TABLET | Freq: Two times a day (BID) | ORAL | 0 refills | Status: DC

## 2019-03-12 NOTE — Patient Instructions (Addendum)
F/U in 6 months, call if you need me before  You are referred for diabetic eye exam   Fasting lipid, CBC, PSA in  AUGUST WHEN YOU ARE GETTING LABS FOR Dr Dorris Fetch  It is important that you exercise regularly at least 30 minutes 5 times a week. If you develop chest pain, have severe difficulty breathing, or feel very tired, stop exercising immediately and seek medical attention    Think about what you will eat, plan ahead. Choose " clean, green, fresh or frozen" over canned, processed or packaged foods which are more sugary, salty and fatty. 70 to 75% of food eaten should be vegetables and fruit. Three meals at set times with snacks allowed between meals, but they must be fruit or vegetables. Aim to eat over a 12 hour period , example 7 am to 7 pm, and STOP after  your last meal of the day. Drink water,generally about 64 ounces per day, no other drink is as healthy. Fruit juice is best enjoyed in a healthy way, by EATING the fruit.   Thanks for choosing [facility], we consider it a privelige to serve you.

## 2019-03-12 NOTE — Assessment & Plan Note (Signed)
Reports wheezing at night. Start Singulair and short course of prednisone precribed

## 2019-03-12 NOTE — Assessment & Plan Note (Signed)
Deteriorated.  Patient re-educated about  the importance of commitment to a  minimum of 150 minutes of exercise per week as able.  The importance of healthy food choices with portion control discussed, as well as eating regularly and within a 12 hour window most days. The need to choose "clean , green" food 50 to 75% of the time is discussed, as well as to make water the primary drink and set a goal of 64 ounces water daily.  Encouraged to start a food diary,  and to consider  joining a support group. Sample diet sheets offered. Goals set by the patient for the next several months.   Weight /BMI 03/12/2019 01/17/2019 12/13/2018  WEIGHT 229 lb 220 lb 220 lb 1 oz  HEIGHT 5\' 10"  5\' 10"  5\' 10"   BMI 32.86 kg/m2 31.57 kg/m2 31.58 kg/m2

## 2019-03-12 NOTE — Assessment & Plan Note (Signed)

## 2019-03-12 NOTE — Assessment & Plan Note (Signed)
Controlled, no change in medication Travis Herrera is reminded of the importance of commitment to daily physical activity for 30 minutes or more, as able and the need to limit carbohydrate intake to 30 to 60 grams per meal to help with blood sugar control.   The need to take medication as prescribed, test blood sugar as directed, and to call between visits if there is a concern that blood sugar is uncontrolled is also discussed.   Travis Herrera is reminded of the importance of daily foot exam, annual eye examination, and good blood sugar, blood pressure and cholesterol control.  Diabetic Labs Latest Ref Rng & Units 12/06/2018 08/30/2018 06/03/2018 02/26/2018 10/24/2017  HbA1c <5.7 % of total Hgb 7.5(H) - 6.3(H) 7.0(H) 7.0(H)  Microalbumin mg/dL 1.3 - - - -  Micro/Creat Ratio <30 mcg/mg creat 10 - - - -  Chol <200 mg/dL - 143 - 131 131  HDL >40 mg/dL - 31(L) - 33(L) 33(L)  Calc LDL mg/dL (calc) - 91 - 80 82  Triglycerides <150 mg/dL - 116 - 96 77  Creatinine 0.70 - 1.25 mg/dL 1.81(H) 1.93(H) 1.53(H) 1.48(H) 1.35(H)   BP/Weight 03/12/2019 01/17/2019 12/13/2018 08/06/2018 07/26/2018 1/55/2080 11/18/3359  Systolic BP 224 497 530 051 102 111 735  Diastolic BP 70 83 73 78 69 67 78  Wt. (Lbs) 229 220 220.06 211.12 213 212 202.8  BMI 32.86 31.57 31.58 30.29 30.56 30.42 29.1   Foot/eye exam completion dates Latest Ref Rng & Units 02/20/2018 01/18/2017  Eye Exam No Retinopathy - -  Foot Form Completion - Done Done      Managed by endo

## 2019-03-12 NOTE — Addendum Note (Signed)
Addended by: Eual Fines on: 03/12/2019 02:25 PM   Modules accepted: Orders

## 2019-03-12 NOTE — Progress Notes (Signed)
Travis Herrera     MRN: [id number]      DOB: Dec 28, 1955   HPI: Patient is in for annual physical exam. Increased and disabling knee pain and instability of left knee, has upcoming surgery. Swelling and pain on right knee which recently had surgery Needs cataract surgery but holding on this , needs eye exam, wants knee surgery first Weight gain excess and inability to walk taking it's toll, bought bike recently and will use this Recent labs, if available are reviewed. Immunization is reviewed , and  updated if needed. C/o wheezoinfg in recent times , has benefited for prednisone in the past, states having increased and uncontrolled allergies for past several weeks    PE; BP 130/77   Pulse 82   Temp 98.6 F (37 C) (Oral)   Resp 12   Ht 5\' 10"  (1.778 m)   Wt 229 lb (103.9 kg)   SpO2 97%   BMI 32.86 kg/m   Pleasant male, alert and oriented x 3, in no cardio-pulmonary distress. Afebrile. HEENT No facial trauma or asymetry. Sinuses non tender. EOMI, External ears normal,  Oropharynx moist, Neck: supple,  Chest: Clear to ascultation bilaterally.No crackles or wheezes. Non tender to palpation   Cardiovascular system; Heart sounds normal,  S1 and  S2 ,no S3.  No murmur, or thrill. Apical beat not displaced Peripheral pulses normal.  Abdomen: Soft, non tender,No guarding, tenderness or rebound.     Musculoskeletal exam: Full ROM of spine, hips , shoulders and markedly reduced in both  Knnees, left is more swollen and deformed.  Neurologic: Cranial nerves 2 to 12 intact. Power, normal throughout. Abnormal gait. No tremor.  Skin: Intact, no ulceration, erythema , scaling or rash noted. Pigmentation normal throughout  Psych; Normal mood and affect. Judgement and concentration normal   Assessment & Plan:  Annual physical exam Annual exam as documented. Counseling done  re healthy lifestyle involving commitment to 150 minutes exercise per week, heart healthy  diet, and attaining healthy weight.The importance of adequate sleep also discussed. Regular seat belt use and home safety, is also discussed. Changes in health habits are decided on by the patient with goals and time frames  set for achieving them. Immunization and cancer screening needs are specifically addressed at this visit.   Type 2 diabetes mellitus with stage 2 chronic kidney disease, with long-term current use of insulin (HCC) Controlled, no change in medication [name] is reminded of the importance of commitment to daily physical activity for 30 minutes or more, as able and the need to limit carbohydrate intake to 30 to 60 grams per meal to help with blood sugar control.   The need to take medication as prescribed, test blood sugar as directed, and to call between visits if there is a concern that blood sugar is uncontrolled is also discussed.   Mr. Baby is reminded of the importance of daily foot exam, annual eye examination, and good blood sugar, blood pressure and cholesterol control.  Diabetic Labs Latest Ref Rng & Units 12/06/2018 08/30/2018 06/03/2018 02/26/2018 10/24/2017  HbA1c <5.7 % of total Hgb 7.5(H) - 6.3(H) 7.0(H) 7.0(H)  Microalbumin mg/dL 1.3 - - - -  Micro/Creat Ratio <30 mcg/mg creat 10 - - - -  Chol <200 mg/dL - 143 - 131 131  HDL >40 mg/dL - 31(L) - 33(L) 33(L)  Calc LDL mg/dL (calc) - 91 - 80 82  Triglycerides <150 mg/dL - 116 - 96 77  Creatinine 0.70 - 1.25 mg/dL  1.81(H) 1.93(H) 1.53(H) 1.48(H) 1.35(H)   BP/Weight 03/12/2019 01/17/2019 12/13/2018 08/06/2018 07/26/2018 02/23/210 10/22/3565  Systolic BP 014 103 013 143 [phone]  Diastolic BP 70 83 73 78 69 67 78  Wt. (Lbs) 229 220 220.06 211.12 213 212 202.8  BMI 32.86 31.57 31.58 30.29 30.56 30.42 29.1   Foot/eye exam completion dates Latest Ref Rng & Units 02/20/2018 01/18/2017  Eye Exam No Retinopathy - -  Foot Form Completion - Done Done      Managed by endo   Obesity (BMI 30.0-34.9) Deteriorated.   Patient re-educated about  the importance of commitment to a  minimum of 150 minutes of exercise per week as able.  The importance of healthy food choices with portion control discussed, as well as eating regularly and within a 12 hour window most days. The need to choose "clean , green" food 50 to 75% of the time is discussed, as well as to make water the primary drink and set a goal of 64 ounces water daily.  Encouraged to start a food diary,  and to consider  joining a support group. Sample diet sheets offered. Goals set by the patient for the next several months.   Weight /BMI 03/12/2019 01/17/2019 12/13/2018  WEIGHT 229 lb 220 lb 220 lb 1 oz  HEIGHT 5\' 10"  5\' 10"  5\' 10"   BMI 32.86 kg/m2 31.57 kg/m2 31.58 kg/m2      Seasonal allergies Reports wheezing at night. Start Singulair and short course of prednisone precribed '

## 2019-03-18 DIAGNOSIS — Z1211 Encounter for screening for malignant neoplasm of colon: Secondary | ICD-10-CM | POA: Diagnosis not present

## 2019-03-25 LAB — COLOGUARD: Cologuard: NEGATIVE

## 2019-04-03 ENCOUNTER — Other Ambulatory Visit: Payer: Self-pay | Admitting: Family Medicine

## 2019-04-09 ENCOUNTER — Telehealth: Payer: Self-pay | Admitting: *Deleted

## 2019-04-09 NOTE — Telephone Encounter (Signed)
Pt had a cologuard screening done about 3 weeks ago. He wanted to know what the results were of the cologuard.

## 2019-04-09 NOTE — Telephone Encounter (Signed)
Spoke with patient and let him know his Cologuard test was negative.

## 2019-06-11 ENCOUNTER — Ambulatory Visit: Payer: PPO

## 2019-06-11 ENCOUNTER — Ambulatory Visit: Payer: PPO | Admitting: "Endocrinology

## 2019-06-16 ENCOUNTER — Other Ambulatory Visit: Payer: Self-pay

## 2019-06-16 ENCOUNTER — Ambulatory Visit (INDEPENDENT_AMBULATORY_CARE_PROVIDER_SITE_OTHER): Payer: PPO

## 2019-06-16 DIAGNOSIS — Z23 Encounter for immunization: Secondary | ICD-10-CM | POA: Diagnosis not present

## 2019-06-26 ENCOUNTER — Encounter: Payer: Self-pay | Admitting: Family Medicine

## 2019-06-26 ENCOUNTER — Ambulatory Visit (INDEPENDENT_AMBULATORY_CARE_PROVIDER_SITE_OTHER): Payer: PPO | Admitting: Family Medicine

## 2019-06-26 ENCOUNTER — Other Ambulatory Visit: Payer: Self-pay

## 2019-06-26 VITALS — BP 120/70 | HR 82 | Resp 12 | Ht 70.0 in | Wt 229.0 lb

## 2019-06-26 DIAGNOSIS — Z0000 Encounter for general adult medical examination without abnormal findings: Secondary | ICD-10-CM

## 2019-06-26 MED ORDER — TRIAMTERENE-HCTZ 37.5-25 MG PO TABS: 1.0000 | ORAL_TABLET | Freq: Every day | ORAL | 0 refills | Status: DC

## 2019-06-26 MED ORDER — BENAZEPRIL HCL 20 MG PO TABS: 20.0000 mg | ORAL_TABLET | Freq: Every day | ORAL | 0 refills | Status: DC

## 2019-06-26 MED ORDER — AMLODIPINE BESYLATE 10 MG PO TABS: 10.0000 mg | ORAL_TABLET | Freq: Every day | ORAL | 1 refills | Status: DC

## 2019-06-26 MED ORDER — VICTOZA 18 MG/3ML ~~LOC~~ SOPN: 1.8000 mg | PEN_INJECTOR | SUBCUTANEOUS | 2 refills | Status: DC

## 2019-06-26 MED ORDER — LOVASTATIN 20 MG PO TABS: 20.0000 mg | ORAL_TABLET | Freq: Every day | ORAL | 0 refills | Status: DC

## 2019-06-26 MED ORDER — LANTUS SOLOSTAR 100 UNIT/ML ~~LOC~~ SOPN: 54.0000 [IU] | PEN_INJECTOR | Freq: Every day | SUBCUTANEOUS | 2 refills | Status: DC

## 2019-06-26 NOTE — Progress Notes (Signed)
Subjective:   Travis Herrera is a 63 y.o. male who presents for Medicare Annual/Subsequent preventive examination.  Location of Patient: Home Location of Provider: Telehealth Consent was obtain for visit to be over via telehealth. I verified that I am speaking with the correct person using two identifiers.   Review of Systems:    Cardiac Risk Factors include: advanced age (>75men, >66 women);diabetes mellitus;dyslipidemia;hypertension;obesity (BMI >30kg/m2)     Objective:    Vitals: BP 120/70   Pulse 82   Resp 12   Ht 5\' 10"  (1.778 m)   Wt 229 lb (103.9 kg)   BMI 32.86 kg/m   Body mass index is 32.86 kg/m.  Advanced Directives 06/24/2018 06/24/2018 06/19/2018 03/19/2018 07/25/2012  Does Patient Have a Medical Advance Directive? No No No No Patient does not have advance directive;Patient would not like information  Would patient like information on creating a medical advance directive? Yes (ED - Information included in AVS) Yes (ED - Information included in AVS) No - Patient declined No - Patient declined -  Pre-existing out of facility DNR order (yellow form or pink MOST form) - - - - No    Tobacco Social History   Tobacco Use  Smoking Status Never Smoker  Smokeless Tobacco Never Used     Counseling given: Yes   Clinical Intake:  Pre-visit preparation completed: Yes  Pain : No/denies pain Pain Score: 0-No pain     BMI - recorded: 32.86 Nutritional Status: BMI > 30  Obese Nutritional Risks: None Diabetes: Yes CBG done?: No Did pt. bring in CBG monitor from home?: No  How often do you need to have someone help you when you read instructions, pamphlets, or other written materials from your doctor or pharmacy?: 1 - Never What is the last grade level you completed in school?: 12 + 1 year of college  Interpreter Needed?: No     Past Medical History:  Diagnosis Date  . Diabetes mellitus without complication ([facility]) [id number]   insulin started in 2012  .  Hyperlipemia 2013  . Hypertension 2010   Past Surgical History:  Procedure Laterality Date  . CATARACT EXTRACTION W/PHACO  07/29/2012   Procedure: CATARACT EXTRACTION PHACO AND INTRAOCULAR LENS PLACEMENT (IOC);  Surgeon: Tonny Branch, MD;  Location: [facility];  Service: Ophthalmology;  Laterality: Left;  CDE=1.66  . CHOLECYSTECTOMY  2007   [facility]  . EYE SURGERY Left 2013   cataract  . QUADRICEPS TENDON REPAIR Right 03/19/2018   Procedure: REPAIR QUADRICEP TENDON;  Surgeon: Carole Civil, MD;  Location: [facility];  Service: Orthopedics;  Laterality: Right;   Family History  Problem Relation Age of Onset  . Cancer Sister   . Early death Father        car accident   . Cancer Brother   . Hypertension Brother   . Diabetes Brother   . Hypertension Brother    Social History   Socioeconomic History  . Marital status: Married    Spouse name: Not on file  . Number of children: 4  . Years of education: Not on file  . Highest education level: Not on file  Occupational History  . Not on file  Social Needs  . Financial resource strain: Not hard at all  . Food insecurity    Worry: Never true    Inability: Never true  . Transportation needs    Medical: Yes    Non-medical: Yes  Tobacco Use  . Smoking status: Never Smoker  .  Smokeless tobacco: Never Used  Substance and Sexual Activity  . Alcohol use: No  . Drug use: No  . Sexual activity: Yes    Birth control/protection: None  Lifestyle  . Physical activity    Days per week: 0 days    Minutes per session: 0 min  . Stress: Not at all  Relationships  . Social connections    Talks on phone: More than three times a week    Gets together: More than three times a week    Attends religious service: 1 to 4 times per year    Active member of club or organization: No    Attends meetings of clubs or organizations: Never    Relationship status: Married  Other Topics Concern  . Not on file  Social History Narrative  . Not on file     Outpatient Encounter Medications as of 06/26/2019  Medication Sig  . amLODipine (NORVASC) 10 MG tablet Take 1 tablet by mouth once daily  . aspirin EC 81 MG tablet Take 81 mg by mouth daily.  . benazepril (LOTENSIN) 20 MG tablet Take 1 tablet by mouth once daily  . glucose blood (ONETOUCH VERIO) test strip One touch verio strips- three times daily testing dx e11.65  . Insulin Glargine (LANTUS SOLOSTAR) 100 UNIT/ML Solostar Pen Inject 54 Units into the skin daily with breakfast.  . Insulin Pen Needle 31G X 8 MM MISC 1 each by Does not apply route 2 (two) times daily.  . Lancets (FREESTYLE) lancets Use as instructed bid  . liraglutide (VICTOZA) 18 MG/3ML SOPN INJECT 1.8 MG INTO THE SKIN DAILY AS DIRECTED (Patient taking differently: Inject 1.8 mg into the skin every morning. INJECT 1.8 MG INTO THE SKIN DAILY AS DIRECTED)  . lovastatin (MEVACOR) 20 MG tablet TAKE 1 TABLET BY MOUTH AT BEDTIME  . montelukast (SINGULAIR) 10 MG tablet Take 1 tablet (10 mg total) by mouth at bedtime.  Glory Rosebush DELICA LANCETS [id number] MISC Three times daily testing dx e11.65  . predniSONE (DELTASONE) 10 MG tablet Take 1 tablet (10 mg total) by mouth 2 (two) times daily with a meal.  . Saw Palmetto 1000 MG CAPS Take 1,000 mg by mouth daily.  . sildenafil (VIAGRA) 100 MG tablet Take 1 tablet (100 mg total) by mouth daily as needed for erectile dysfunction.  . triamterene-hydrochlorothiazide (MAXZIDE-25) 37.5-25 MG tablet Take 1 tablet by mouth once daily   No facility-administered encounter medications on file as of 06/26/2019.     Activities of Daily Living In your present state of health, do you have any difficulty performing the following activities: 06/26/2019  Hearing? N  Vision? N  Difficulty concentrating or making decisions? N  Walking or climbing stairs? Y  Comment uses cane especially when its raining or cold  Dressing or bathing? N  Doing errands, shopping? N  Preparing Food and eating ? N  Using the  Toilet? N  In the past six months, have you accidently leaked urine? N  Do you have problems with loss of bowel control? N  Managing your Medications? N  Managing your Finances? N  Housekeeping or managing your Housekeeping? N  Some recent data might be hidden    Patient Care Team: Fayrene Helper, MD as PCP - General (Family Medicine)   Assessment:   This is a routine wellness examination for [place].  Exercise Activities and Dietary recommendations Current Exercise Habits: Home exercise routine, Type of exercise: Other - see comments(bicycle that you use by  sitting on the couch), Time (Minutes): 45, Frequency (Times/Week): 3, Weekly Exercise (Minutes/Week): 135, Intensity: Moderate  Goals   None     Fall Risk Fall Risk  06/26/2019 03/12/2019 08/06/2018 06/24/2018 06/24/2018  Falls in the past year? 0 1 Yes Yes Yes  Number falls in past yr: 0 0 1 1 1   Injury with Fall? 0 1 Yes - -  Risk for fall due to : - History of fall(s);Orthopedic patient - - -   Is the patient's home free of loose throw rugs in walkways, pet beds, electrical cords, etc?   yes      Grab bars in the bathroom? yes      Handrails on the stairs?   yes      Adequate lighting?   yes     Depression Screen PHQ 2/9 Scores 06/26/2019 03/12/2019 08/06/2018 06/24/2018  PHQ - 2 Score 0 0 0 0  PHQ- 9 Score - - 4 2    Cognitive Function     6CIT Screen 06/26/2019 06/24/2018  What Year? 0 points 0 points  What month? 0 points 0 points  What time? 0 points 0 points  Count back from 20 0 points 0 points  Months in reverse 0 points 0 points  Repeat phrase 0 points 0 points  Total Score 0 0    Immunization History  Administered Date(s) Administered  . Influenza Split 07/17/2014  . Influenza,inj,Quad PF,6+ Mos 08/16/2015, 07/26/2016, 08/02/2017, 07/19/2018, 06/16/2019    Qualifies for Shingles Vaccine? Checking for coverage  Screening Tests Health Maintenance  Topic Date Due  . OPHTHALMOLOGY EXAM  07/10/2017   . COLONOSCOPY  02/06/2019  . HEMOGLOBIN A1C  06/06/2019  . PNEUMOCOCCAL POLYSACCHARIDE VACCINE AGE 41-64 HIGH RISK  08/30/2023 (Originally 09/17/1958)  . FOOT EXAM  03/11/2020  . TETANUS/TDAP  05/03/2021  . INFLUENZA VACCINE  Completed  . Hepatitis C Screening  Completed  . HIV Screening  Completed   Cancer Screenings: Lung: Low Dose CT Chest recommended if Age 81-80 years, 30 pack-year currently smoking OR have quit w/in 15years. Patient does not qualify. Colorectal:  Up to date  Additional Screenings:   Hepatitis C Screening: Completed      Plan:       1. Encounter for Medicare annual wellness exam  I have personally reviewed and noted the following in the patient's chart:   . Medical and social history . Use of alcohol, tobacco or illicit drugs  . Current medications and supplements . Functional ability and status . Nutritional status . Physical activity . Advanced directives . List of other physicians . Hospitalizations, surgeries, and ER visits in previous 12 months . Vitals . Screenings to include cognitive, depression, and falls . Referrals and appointments  In addition, I have reviewed and discussed with patient certain preventive protocols, quality metrics, and best practice recommendations. A written personalized care plan for preventive services as well as general preventive health recommendations were provided to patient.     I provided 20 minutes of non-face-to-face time during this encounter.    Perlie Mayo, NP  06/26/2019

## 2019-06-26 NOTE — Patient Instructions (Addendum)
[name] , Thank you for taking time to come for your Medicare Wellness Visit. I appreciate your ongoing commitment to your health goals. Please review the following plan we discussed and let me know if I can assist you in the future.   Please continue to practice social distancing to keep you, your family, and our community safe.  If you must go out, please wear a Mask and practice good handwashing.  Screening recommendations/referrals: Colonoscopy: Up to date Recommended yearly ophthalmology/optometry visit for glaucoma screening and checkup Recommended yearly dental visit for hygiene and checkup  Vaccinations: Influenza vaccine: Completed Pneumococcal vaccine: Decline  Tdap vaccine: Due 2022 Shingles vaccine: Call for insurance coverage  Advanced directives: No paperwork, wife knows wishes  Conditions/risks identified: Falls  Next appointment: 09/09/2019   Preventive Care 40-64 Years, Male Preventive care refers to lifestyle choices and visits with your health care provider that can promote health and wellness. What does preventive care include?  A yearly physical exam. This is also called an annual well check.  Dental exams once or twice a year.  Routine eye exams. Ask your health care provider how often you should have your eyes checked.  Personal lifestyle choices, including:  Daily care of your teeth and gums.  Regular physical activity.  Eating a healthy diet.  Avoiding tobacco and drug use.  Limiting alcohol use.  Practicing safe sex.  Taking low-dose aspirin every day starting at age 42. What happens during an annual well check? The services and screenings done by your health care provider during your annual well check will depend on your age, overall health, lifestyle risk factors, and family history of disease. Counseling  Your health care provider may ask you questions about your:  Alcohol use.  Tobacco use.  Drug use.  Emotional well-being.   Home and relationship well-being.  Sexual activity.  Eating habits.  Work and work Statistician. Screening  You may have the following tests or measurements:  Height, weight, and BMI.  Blood pressure.  Lipid and cholesterol levels. These may be checked every 5 years, or more frequently if you are over 58 years old.  Skin check.  Lung cancer screening. You may have this screening every year starting at age 13 if you have a 30-pack-year history of smoking and currently smoke or have quit within the past 15 years.  Fecal occult blood test (FOBT) of the stool. You may have this test every year starting at age 74.  Flexible sigmoidoscopy or colonoscopy. You may have a sigmoidoscopy every 5 years or a colonoscopy every 10 years starting at age 82.  Prostate cancer screening. Recommendations will vary depending on your family history and other risks.  Hepatitis C blood test.  Hepatitis B blood test.  Sexually transmitted disease (STD) testing.  Diabetes screening. This is done by checking your blood sugar (glucose) after you have not eaten for a while (fasting). You may have this done every 1-3 years. Discuss your test results, treatment options, and if necessary, the need for more tests with your health care provider. Vaccines  Your health care provider may recommend certain vaccines, such as:  Influenza vaccine. This is recommended every year.  Tetanus, diphtheria, and acellular pertussis (Tdap, Td) vaccine. You may need a Td booster every 10 years.  Zoster vaccine. You may need this after age 67.  Pneumococcal 13-valent conjugate (PCV13) vaccine. You may need this if you have certain conditions and have not been vaccinated.  Pneumococcal polysaccharide (PPSV23) vaccine. You may  need one or two doses if you smoke cigarettes or if you have certain conditions. Talk to your health care provider about which screenings and vaccines you need and how often you need them. This  information is not intended to replace advice given to you by your health care provider. Make sure you discuss any questions you have with your health care provider. Document Released: 10/29/2015 Document Revised: 06/21/2016 Document Reviewed: 08/03/2015 Elsevier Interactive Patient Education  2017 [place] Prevention in the Home Falls can cause injuries. They can happen to people of all ages. There are many things you can do to make your home safe and to help prevent falls. What can I do on the outside of my home?  Regularly fix the edges of walkways and driveways and fix any cracks.  Remove anything that might make you trip as you walk through a door, such as a raised step or threshold.  Trim any bushes or trees on the path to your home.  Use bright outdoor lighting.  Clear any walking paths of anything that might make someone trip, such as rocks or tools.  Regularly check to see if handrails are loose or broken. Make sure that both sides of any steps have handrails.  Any raised decks and porches should have guardrails on the edges.  Have any leaves, snow, or ice cleared regularly.  Use sand or salt on walking paths during winter.  Clean up any spills in your garage right away. This includes oil or grease spills. What can I do in the bathroom?  Use night lights.  Install grab bars by the toilet and in the tub and shower. Do not use towel bars as grab bars.  Use non-skid mats or decals in the tub or shower.  If you need to sit down in the shower, use a plastic, non-slip stool.  Keep the floor dry. Clean up any water that spills on the floor as soon as it happens.  Remove soap buildup in the tub or shower regularly.  Attach bath mats securely with double-sided non-slip rug tape.  Do not have throw rugs and other things on the floor that can make you trip. What can I do in the bedroom?  Use night lights.  Make sure that you have a light by your bed that is  easy to reach.  Do not use any sheets or blankets that are too big for your bed. They should not hang down onto the floor.  Have a firm chair that has side arms. You can use this for support while you get dressed.  Do not have throw rugs and other things on the floor that can make you trip. What can I do in the kitchen?  Clean up any spills right away.  Avoid walking on wet floors.  Keep items that you use a lot in easy-to-reach places.  If you need to reach something above you, use a strong step stool that has a grab bar.  Keep electrical cords out of the way.  Do not use floor polish or wax that makes floors slippery. If you must use wax, use non-skid floor wax.  Do not have throw rugs and other things on the floor that can make you trip. What can I do with my stairs?  Do not leave any items on the stairs.  Make sure that there are handrails on both sides of the stairs and use them. Fix handrails that are broken or loose. Make sure  that handrails are as long as the stairways.  Check any carpeting to make sure that it is firmly attached to the stairs. Fix any carpet that is loose or worn.  Avoid having throw rugs at the top or bottom of the stairs. If you do have throw rugs, attach them to the floor with carpet tape.  Make sure that you have a light switch at the top of the stairs and the bottom of the stairs. If you do not have them, ask someone to add them for you. What else can I do to help prevent falls?  Wear shoes that:  Do not have high heels.  Have rubber bottoms.  Are comfortable and fit you well.  Are closed at the toe. Do not wear sandals.  If you use a stepladder:  Make sure that it is fully opened. Do not climb a closed stepladder.  Make sure that both sides of the stepladder are locked into place.  Ask someone to hold it for you, if possible.  Clearly mark and make sure that you can see:  Any grab bars or handrails.  First and last steps.   Where the edge of each step is.  Use tools that help you move around (mobility aids) if they are needed. These include:  Canes.  Walkers.  Scooters.  Crutches.  Turn on the lights when you go into a dark area. Replace any light bulbs as soon as they burn out.  Set up your furniture so you have a clear path. Avoid moving your furniture around.  If any of your floors are uneven, fix them.  If there are any pets around you, be aware of where they are.  Review your medicines with your doctor. Some medicines can make you feel dizzy. This can increase your chance of falling. Ask your doctor what other things that you can do to help prevent falls. This information is not intended to replace advice given to you by your health care provider. Make sure you discuss any questions you have with your health care provider. Document Released: 07/29/2009 Document Revised: 03/09/2016 Document Reviewed: 11/06/2014 Elsevier Interactive Patient Education  2017 [employer].

## 2019-07-02 ENCOUNTER — Other Ambulatory Visit: Payer: Self-pay | Admitting: "Endocrinology

## 2019-07-02 DIAGNOSIS — Z125 Encounter for screening for malignant neoplasm of prostate: Secondary | ICD-10-CM | POA: Diagnosis not present

## 2019-07-02 DIAGNOSIS — E119 Type 2 diabetes mellitus without complications: Secondary | ICD-10-CM | POA: Diagnosis not present

## 2019-07-02 DIAGNOSIS — E1122 Type 2 diabetes mellitus with diabetic chronic kidney disease: Secondary | ICD-10-CM | POA: Diagnosis not present

## 2019-07-02 DIAGNOSIS — N182 Chronic kidney disease, stage 2 (mild): Secondary | ICD-10-CM

## 2019-07-02 DIAGNOSIS — Z794 Long term (current) use of insulin: Secondary | ICD-10-CM | POA: Diagnosis not present

## 2019-07-02 LAB — CBC
HCT: 37.9 % — ABNORMAL LOW (ref 38.5–50.0)
Hemoglobin: 12.8 g/dL — ABNORMAL LOW (ref 13.2–17.1)
MCH: 30.4 pg (ref 27.0–33.0)
MCHC: 33.8 g/dL (ref 32.0–36.0)
MCV: 90 fL (ref 80.0–100.0)
MPV: 10.7 fL (ref 7.5–12.5)
Platelets: 278 10*3/uL (ref 140–400)
RBC: 4.21 10*6/uL (ref 4.20–5.80)
RDW: 13.1 % (ref 11.0–15.0)
WBC: 9.5 10*3/uL (ref 3.8–10.8)

## 2019-07-02 LAB — LIPID PANEL
Cholesterol: 119 mg/dL (ref <200)
HDL: 29 mg/dL — ABNORMAL LOW (ref >=40)
LDL Cholesterol (Calc): 66 mg/dL (calc)
Non-HDL Cholesterol (Calc): 90 mg/dL (calc) (ref <130)
Total CHOL/HDL Ratio: 4.1 (calc) (ref <5.0)
Triglycerides: 159 mg/dL — ABNORMAL HIGH (ref <150)

## 2019-07-02 LAB — PSA: PSA: 1.2 ng/mL (ref <=4.0)

## 2019-07-03 ENCOUNTER — Encounter: Payer: Self-pay | Admitting: Family Medicine

## 2019-07-03 LAB — HEMOGLOBIN A1C
Hgb A1c MFr Bld: 8 % of total Hgb — ABNORMAL HIGH (ref <5.7)
Mean Plasma Glucose: 183 (calc)
eAG (mmol/L): 10.1 (calc)

## 2019-07-03 LAB — COMPREHENSIVE METABOLIC PANEL
AG Ratio: 1.4 (calc) (ref 1.0–2.5)
ALT: 15 U/L (ref 9–46)
AST: 15 U/L (ref 10–35)
Albumin: 4.2 g/dL (ref 3.6–5.1)
Alkaline phosphatase (APISO): 87 U/L (ref 35–144)
BUN/Creatinine Ratio: 18 (calc) (ref 6–22)
BUN: 33 mg/dL — ABNORMAL HIGH (ref 7–25)
CO2: 25 mmol/L (ref 20–32)
Calcium: 9.7 mg/dL (ref 8.6–10.3)
Chloride: 105 mmol/L (ref 98–110)
Creat: 1.81 mg/dL — ABNORMAL HIGH (ref 0.70–1.25)
Globulin: 2.9 g/dL (calc) (ref 1.9–3.7)
Glucose, Bld: 148 mg/dL — ABNORMAL HIGH (ref 65–99)
Potassium: 4.7 mmol/L (ref 3.5–5.3)
Sodium: 138 mmol/L (ref 135–146)
Total Bilirubin: 0.3 mg/dL (ref 0.2–1.2)
Total Protein: 7.1 g/dL (ref 6.1–8.1)

## 2019-07-07 ENCOUNTER — Ambulatory Visit (INDEPENDENT_AMBULATORY_CARE_PROVIDER_SITE_OTHER): Payer: PPO | Admitting: "Endocrinology

## 2019-07-07 ENCOUNTER — Encounter: Payer: Self-pay | Admitting: "Endocrinology

## 2019-07-07 ENCOUNTER — Other Ambulatory Visit: Payer: Self-pay

## 2019-07-07 DIAGNOSIS — Z794 Long term (current) use of insulin: Secondary | ICD-10-CM | POA: Diagnosis not present

## 2019-07-07 DIAGNOSIS — E782 Mixed hyperlipidemia: Secondary | ICD-10-CM

## 2019-07-07 DIAGNOSIS — I10 Essential (primary) hypertension: Secondary | ICD-10-CM | POA: Diagnosis not present

## 2019-07-07 DIAGNOSIS — E1122 Type 2 diabetes mellitus with diabetic chronic kidney disease: Secondary | ICD-10-CM

## 2019-07-07 DIAGNOSIS — N182 Chronic kidney disease, stage 2 (mild): Secondary | ICD-10-CM | POA: Diagnosis not present

## 2019-07-07 MED ORDER — GLIPIZIDE ER 5 MG PO TB24: 5.0000 mg | ORAL_TABLET | Freq: Every day | ORAL | 3 refills | Status: DC

## 2019-07-07 NOTE — Progress Notes (Signed)
07/07/2019                                                    Endocrinology Telehealth Visit Follow up Note -During COVID -19 Pandemic  This visit type was conducted due to national recommendations for restrictions regarding the COVID-19 Pandemic  in an effort to limit this patient's exposure and mitigate transmission of the corona virus.  Due to his co-morbid illnesses, IWAN AMAN is at  moderate to high risk for complications without adequate follow up.  This format is felt to be most appropriate for him at this time.  I connected with this patient on 07/07/2019   by telephone and verified that I am speaking with the correct person using two identifiers. [name], November 05, 1955. he has verbally consented to this visit. All issues noted in this document were discussed and addressed. The format was not optimal for physical exam.   Subjective:    Patient ID: [name], male    DOB: [birth year],    Past Medical History:  Diagnosis Date  . Diabetes mellitus without complication ([place]) [id number]   insulin started in 2012  . Hyperlipemia 2013  . Hypertension 2010   Past Surgical History:  Procedure Laterality Date  . CATARACT EXTRACTION W/PHACO  07/29/2012   Procedure: CATARACT EXTRACTION PHACO AND INTRAOCULAR LENS PLACEMENT (IOC);  Surgeon: Tonny Branch, MD;  Location: [facility];  Service: Ophthalmology;  Laterality: Left;  CDE=1.66  . CHOLECYSTECTOMY  2007   [facility]  . EYE SURGERY Left 2013   cataract  . QUADRICEPS TENDON REPAIR Right 03/19/2018   Procedure: REPAIR QUADRICEP TENDON;  Surgeon: Carole Civil, MD;  Location: [facility];  Service: Orthopedics;  Laterality: Right;   Social History   Socioeconomic History  . Marital status: Married    Spouse name: Not on file  . Number of children: 4  . Years of education: Not on file  . Highest education level: Not on file  Occupational History  . Not on file  Social Needs  . Financial resource strain: Not hard at all  .  Food insecurity    Worry: Never true    Inability: Never true  . Transportation needs    Medical: Yes    Non-medical: Yes  Tobacco Use  . Smoking status: Never Smoker  . Smokeless tobacco: Never Used  Substance and Sexual Activity  . Alcohol use: No  . Drug use: No  . Sexual activity: Yes    Birth control/protection: None  Lifestyle  . Physical activity    Days per week: 0 days    Minutes per session: 0 min  . Stress: Not at all  Relationships  . Social connections    Talks on phone: More than three times a week    Gets together: More than three times a week    Attends religious service: 1 to 4 times per year    Active member of club or organization: No    Attends meetings of clubs or organizations: Never    Relationship status: Married  Other Topics Concern  . Not on file  Social History Narrative  . Not on file   Outpatient Encounter Medications as of 07/07/2019  Medication Sig  . lovastatin (MEVACOR) 20 MG tablet Take 20 mg by mouth at bedtime.  Marland Kitchen amLODipine (  NORVASC) 10 MG tablet Take 1 tablet (10 mg total) by mouth daily.  Marland Kitchen aspirin EC 81 MG tablet Take 81 mg by mouth daily.  . benazepril (LOTENSIN) 20 MG tablet Take 1 tablet (20 mg total) by mouth daily.  Marland Kitchen glipiZIDE (GLUCOTROL XL) 5 MG 24 hr tablet Take 1 tablet (5 mg total) by mouth daily with breakfast.  . glucose blood (ONETOUCH VERIO) test strip One touch verio strips- three times daily testing dx e11.65  . Insulin Glargine (LANTUS SOLOSTAR) 100 UNIT/ML Solostar Pen Inject 54 Units into the skin daily with breakfast.  . Insulin Pen Needle 31G X 8 MM MISC 1 each by Does not apply route 2 (two) times daily.  . Lancets (FREESTYLE) lancets Use as instructed bid  . liraglutide (VICTOZA) 18 MG/3ML SOPN Inject 0.3 mLs (1.8 mg total) into the skin every morning. INJECT 1.8 MG INTO THE SKIN DAILY AS DIRECTED  . montelukast (SINGULAIR) 10 MG tablet Take 1 tablet (10 mg total) by mouth at bedtime.  Glory Rosebush DELICA  LANCETS [id number] MISC Three times daily testing dx e11.65  . Saw Palmetto 1000 MG CAPS Take 1,000 mg by mouth daily.  . sildenafil (VIAGRA) 100 MG tablet Take 1 tablet (100 mg total) by mouth daily as needed for erectile dysfunction.  . triamterene-hydrochlorothiazide (MAXZIDE-25) 37.5-25 MG tablet Take 1 tablet by mouth daily.  . [DISCONTINUED] lovastatin (MEVACOR) 20 MG tablet Take 1 tablet (20 mg total) by mouth at bedtime.   No facility-administered encounter medications on file as of 07/07/2019.    ALLERGIES: No Known Allergies VACCINATION STATUS: Immunization History  Administered Date(s) Administered  . Influenza Split 07/17/2014  . Influenza,inj,Quad PF,6+ Mos 08/16/2015, 07/26/2016, 08/02/2017, 07/19/2018, 06/16/2019    Diabetes He presents for his follow-up diabetic visit. He has type 2 diabetes mellitus. Onset time: He was diagnosed at approximate age of 89 years. His disease course has been worsening. There are no hypoglycemic associated symptoms. Pertinent negatives for hypoglycemia include no confusion, headaches, pallor or seizures. There are no diabetic associated symptoms. Pertinent negatives for diabetes include no chest pain, no fatigue, no polydipsia, no polyphagia, no polyuria and no weakness. There are no hypoglycemic complications. Symptoms are worsening. Diabetic complications include nephropathy. Risk factors for coronary artery disease include diabetes mellitus, dyslipidemia, hypertension, family history, male sex and sedentary lifestyle. Current diabetic treatments: He is on Lantus 50 units daily at breakfast and Victoza 1.8 mg subcutaneously daily. He is compliant with treatment most of the time. His weight is increasing steadily. He is following a generally unhealthy diet. He rarely participates in exercise. His home blood glucose trend is increasing steadily. His breakfast blood glucose range is generally 140-180 mg/dl. His bedtime blood glucose range is generally 180-200  mg/dl. His overall blood glucose range is 180-200 mg/dl. An ACE inhibitor/angiotensin II receptor blocker is being taken. Eye exam is current.  Hypertension This is a chronic problem. The current episode started more than 1 year ago. The problem has been gradually improving since onset. The problem is controlled. Pertinent negatives include no chest pain, headaches, neck pain, palpitations or shortness of breath. Risk factors for coronary artery disease include dyslipidemia, diabetes mellitus and male gender. Past treatments include ACE inhibitors. There are no compliance problems.   Hyperlipidemia This is a chronic problem. The current episode started more than 1 year ago. Exacerbating diseases include diabetes. Pertinent negatives include no chest pain, myalgias or shortness of breath. Current antihyperlipidemic treatment includes statins. Risk factors for coronary artery disease  include dyslipidemia, diabetes mellitus, hypertension and male sex.     Review of systems: Limited as above.  Objective:    There were no vitals taken for this visit.  Wt Readings from Last 3 Encounters:  06/26/19 229 lb (103.9 kg)  03/12/19 229 lb (103.9 kg)  01/17/19 220 lb (99.8 kg)      Results for orders placed or performed in visit on 07/02/19  Comprehensive metabolic panel  Result Value Ref Range   Glucose, Bld 148 (H) 65 - 99 mg/dL   BUN 33 (H) 7 - 25 mg/dL   Creat 1.81 (H) 0.70 - 1.25 mg/dL   BUN/Creatinine Ratio 18 6 - 22 (calc)   Sodium 138 135 - 146 mmol/L   Potassium 4.7 3.5 - 5.3 mmol/L   Chloride 105 98 - 110 mmol/L   CO2 25 20 - 32 mmol/L   Calcium 9.7 8.6 - 10.3 mg/dL   Total Protein 7.1 6.1 - 8.1 g/dL   Albumin 4.2 3.6 - 5.1 g/dL   Globulin 2.9 1.9 - 3.7 g/dL (calc)   AG Ratio 1.4 1.0 - 2.5 (calc)   Total Bilirubin 0.3 0.2 - 1.2 mg/dL   Alkaline phosphatase (APISO) 87 35 - 144 U/L   AST 15 10 - 35 U/L   ALT 15 9 - 46 U/L  Hemoglobin A1c  Result Value Ref Range   Hgb A1c MFr Bld  8.0 (H) <5.7 % of total Hgb   Mean Plasma Glucose 183 (calc)   eAG (mmol/L) 10.1 (calc)   Diabetic Labs (most recent): Lab Results  Component Value Date   HGBA1C 8.0 (H) 07/02/2019   HGBA1C 7.5 (H) 12/06/2018   HGBA1C 6.3 (H) 06/03/2018   Lipid Panel     Component Value Date/Time   CHOL 119 07/02/2019 0832   TRIG 159 (H) 07/02/2019 0832   HDL 29 (L) 07/02/2019 0832   CHOLHDL 4.1 07/02/2019 0832   VLDL 12 01/16/2017 0940   LDLCALC 66 07/02/2019 0832      Assessment & Plan:   1. diabetes mellitus type 2 without complications, unspecified long term insulin use status ([place]) Mr. Dang reports significantly above target glycemic profile and A1c of 8% progressively increasing from 6.3%.     Recent labs reviewed, showing mild renal insufficiency. - Patient remains at a high risk for more acute and chronic complications of diabetes which include CAD, CVA, CKD, retinopathy, and neuropathy. These are all discussed in detail with the patient.  - I have re-counseled the patient on diet management by adopting a carbohydrate restricted / protein rich  Diet. - He is advised to stick to a routine mealtimes to eat 3 meals  a day and avoid unnecessary snacks ( to snack only to correct hypoglycemia).  - he  admits there is a room for improvement in his diet and drink choices. -  Suggestion is made for him to avoid simple carbohydrates  from his diet including Cakes, Sweet Desserts / Pastries, Ice Cream, Soda (diet and regular), Sweet Tea, Candies, Chips, Cookies, Sweet Pastries,  Store Bought Juices, Alcohol in Excess of  1-2 drinks a day, Artificial Sweeteners, Coffee Creamer, and "Sugar-free" Products. This will help patient to have stable blood glucose profile and potentially avoid unintended weight gain.   - I have approached patient with the following individualized plan to manage diabetes and patient agrees.   -Based on his presentation, he will not require prandial insulin for now.     -He is advised to continue  Lantus 54 units  daily in the morning with breakfast,   associated with strict monitoring of blood glucose 2 times a day-daily before breakfast and at bedtime. -He is warned not to take insulin without proper monitoring per orders. -Patient is encouraged to call clinic for blood glucose levels less than 70 or above 300 mg /dl. - He is advised to continue continue  VICTOZA 1.8  mg subcutaneous daily before supper, therapeutically suitable for patient. -I discussed and initiated glipizide 5 mg XL p.o. daily at breakfast. -His renal function is stable since last visit, takes only Tylenol when he needs it at this time.  If renal function does not improve by next visit, he will be considered for nephrology referral.  - He is advised to avoid large doses of NSAIDs, and advised to maintain adequate hydration.    - Patient specific target  for A1c; LDL, HDL, Triglycerides, and  Waist Circumference were discussed in detail.  2) BP/HTN: he is advised to home monitor blood pressure and report if > 140/90 on 2 separate readings.    He is advised to continue his current blood pressure medications including benazepril 20 mg p.o. daily.    3) Lipids/HPL: Recent lipid panel showed controlled LDL at 76.  He will continue to benefit from statin therapy, advised to continue lovastatin 20 mg p.o. nightly.    4)  Weight/Diet: CDE consult in progress, exercise, and carbohydrates information provided.  5) Chronic Care/Health Maintenance:  -Patient  on ACEI and Statin medications and encouraged to continue to follow up with Ophthalmology, Podiatrist at least yearly or according to recommendations, and advised to stay away from smoking. I have recommended yearly flu vaccine and pneumonia vaccination at least every 5 years; moderate intensity exercise for up to 150 minutes weekly; and  sleep for at least 7 hours a day.  I advised patient to maintain close follow up with his PCP for primary  care needs.  - Patient Care Time Today:  25 min, of which >50% was spent in  counseling and the rest reviewing his  current and  previous labs/studies, previous treatments, his blood glucose readings, and medications' doses and developing a plan for long-term care based on the latest recommendations for standards of care.   [name] participated in the discussions, expressed understanding, and voiced agreement with the above plans.  All questions were answered to his satisfaction. he is encouraged to contact clinic should he have any questions or concerns prior to his return visit.   Follow up plan: Return in about 4 weeks (around 08/04/2019) for Follow up with [facility] Logs Only - no Labs.  Glade Lloyd, MD Phone: [phone]  Fax: [phone]  This note was partially dictated with voice recognition software. Similar sounding words can be transcribed inadequately or may not  be corrected upon review.  07/07/2019, 12:36 PM

## 2019-08-06 ENCOUNTER — Other Ambulatory Visit: Payer: Self-pay

## 2019-08-06 ENCOUNTER — Ambulatory Visit (INDEPENDENT_AMBULATORY_CARE_PROVIDER_SITE_OTHER): Payer: PPO | Admitting: "Endocrinology

## 2019-08-06 ENCOUNTER — Encounter: Payer: Self-pay | Admitting: "Endocrinology

## 2019-08-06 DIAGNOSIS — E1122 Type 2 diabetes mellitus with diabetic chronic kidney disease: Secondary | ICD-10-CM

## 2019-08-06 DIAGNOSIS — N182 Chronic kidney disease, stage 2 (mild): Secondary | ICD-10-CM

## 2019-08-06 DIAGNOSIS — Z794 Long term (current) use of insulin: Secondary | ICD-10-CM | POA: Diagnosis not present

## 2019-08-06 DIAGNOSIS — I10 Essential (primary) hypertension: Secondary | ICD-10-CM

## 2019-08-06 DIAGNOSIS — E782 Mixed hyperlipidemia: Secondary | ICD-10-CM

## 2019-08-06 NOTE — Progress Notes (Signed)
08/06/2019                                                    Endocrinology Telehealth Visit Follow up Note -During COVID -19 Pandemic  This visit type was conducted due to national recommendations for restrictions regarding the COVID-19 Pandemic  in an effort to limit this patient's exposure and mitigate transmission of the corona virus.  Due to his co-morbid illnesses, Travis Herrera is at  moderate to high risk for complications without adequate follow up.  This format is felt to be most appropriate for him at this time.  I connected with this patient on 08/06/2019   by telephone and verified that I am speaking with the correct person using two identifiers. Travis Herrera, [birth year]. he has verbally consented to this visit. All issues noted in this document were discussed and addressed. The format was not optimal for physical exam.   Subjective:    Patient ID: Travis Herrera, male    DOB: 03-25-1956,    Past Medical History:  Diagnosis Date  . Diabetes mellitus without complication ([place]) [id number]   insulin started in 2012  . Hyperlipemia 2013  . Hypertension 2010   Past Surgical History:  Procedure Laterality Date  . CATARACT EXTRACTION W/PHACO  07/29/2012   Procedure: CATARACT EXTRACTION PHACO AND INTRAOCULAR LENS PLACEMENT (IOC);  Surgeon: Tonny Branch, MD;  Location: [facility];  Service: Ophthalmology;  Laterality: Left;  CDE=1.66  . CHOLECYSTECTOMY  2007   [facility]  . EYE SURGERY Left 2013   cataract  . QUADRICEPS TENDON REPAIR Right 03/19/2018   Procedure: REPAIR QUADRICEP TENDON;  Surgeon: Carole Civil, MD;  Location: [facility];  Service: Orthopedics;  Laterality: Right;   Social History   Socioeconomic History  . Marital status: Married    Spouse name: Not on file  . Number of children: 4  . Years of education: Not on file  . Highest education level: Not on file  Occupational History  . Not on file  Social Needs  . Financial resource strain: Not hard at all  .  Food insecurity    Worry: Never true    Inability: Never true  . Transportation needs    Medical: Yes    Non-medical: Yes  Tobacco Use  . Smoking status: Never Smoker  . Smokeless tobacco: Never Used  Substance and Sexual Activity  . Alcohol use: No  . Drug use: No  . Sexual activity: Yes    Birth control/protection: None  Lifestyle  . Physical activity    Days per week: 0 days    Minutes per session: 0 min  . Stress: Not at all  Relationships  . Social connections    Talks on phone: More than three times a week    Gets together: More than three times a week    Attends religious service: 1 to 4 times per year    Active member of club or organization: No    Attends meetings of clubs or organizations: Never    Relationship status: Married  Other Topics Concern  . Not on file  Social History Narrative  . Not on file   Outpatient Encounter Medications as of 08/06/2019  Medication Sig  . amLODipine (NORVASC) 10 MG tablet Take 1 tablet (10 mg total) by mouth daily.  Marland Kitchen  aspirin EC 81 MG tablet Take 81 mg by mouth daily.  . benazepril (LOTENSIN) 20 MG tablet Take 1 tablet (20 mg total) by mouth daily.  Marland Kitchen glipiZIDE (GLUCOTROL XL) 5 MG 24 hr tablet Take 1 tablet (5 mg total) by mouth daily with breakfast.  . glucose blood (ONETOUCH VERIO) test strip One touch verio strips- three times daily testing dx e11.65  . Insulin Glargine (LANTUS SOLOSTAR) 100 UNIT/ML Solostar Pen Inject 54 Units into the skin daily with breakfast.  . Insulin Pen Needle 31G X 8 MM MISC 1 each by Does not apply route 2 (two) times daily.  . Lancets (FREESTYLE) lancets Use as instructed bid  . liraglutide (VICTOZA) 18 MG/3ML SOPN Inject 0.3 mLs (1.8 mg total) into the skin every morning. INJECT 1.8 MG INTO THE SKIN DAILY AS DIRECTED  . lovastatin (MEVACOR) 20 MG tablet Take 20 mg by mouth at bedtime.  . montelukast (SINGULAIR) 10 MG tablet Take 1 tablet (10 mg total) by mouth at bedtime.  Glory Rosebush DELICA  LANCETS 99991111 MISC Three times daily testing dx e11.65  . Saw Palmetto 1000 MG CAPS Take 1,000 mg by mouth daily.  . sildenafil (VIAGRA) 100 MG tablet Take 1 tablet (100 mg total) by mouth daily as needed for erectile dysfunction.  . triamterene-hydrochlorothiazide (MAXZIDE-25) 37.5-25 MG tablet Take 1 tablet by mouth daily.   No facility-administered encounter medications on file as of 08/06/2019.    ALLERGIES: No Known Allergies VACCINATION STATUS: Immunization History  Administered Date(s) Administered  . Influenza Split 07/17/2014  . Influenza,inj,Quad PF,6+ Mos 08/16/2015, 07/26/2016, 08/02/2017, 07/19/2018, 06/16/2019    Diabetes He presents for his follow-up diabetic visit. He has type 2 diabetes mellitus. Onset time: He was diagnosed at approximate age of 21 years. His disease course has been worsening. There are no hypoglycemic associated symptoms. Pertinent negatives for hypoglycemia include no confusion, headaches, pallor or seizures. There are no diabetic associated symptoms. Pertinent negatives for diabetes include no chest pain, no fatigue, no polydipsia, no polyphagia, no polyuria and no weakness. There are no hypoglycemic complications. Symptoms are worsening. Diabetic complications include nephropathy. Risk factors for coronary artery disease include diabetes mellitus, dyslipidemia, hypertension, family history, male sex and sedentary lifestyle. Current diabetic treatments: He is on Lantus 50 units daily at breakfast and Victoza 1.8 mg subcutaneously daily. He is compliant with treatment most of the time. His weight is increasing steadily. He is following a generally unhealthy diet. He rarely participates in exercise. His home blood glucose trend is decreasing steadily. His breakfast blood glucose range is generally 130-140 mg/dl. His bedtime blood glucose range is generally 140-180 mg/dl. His overall blood glucose range is 140-180 mg/dl. An ACE inhibitor/angiotensin II receptor  blocker is being taken. Eye exam is current.  Hypertension This is a chronic problem. The current episode started more than 1 year ago. The problem has been gradually improving since onset. The problem is controlled. Pertinent negatives include no chest pain, headaches, neck pain, palpitations or shortness of breath. Risk factors for coronary artery disease include dyslipidemia, diabetes mellitus and male gender. Past treatments include ACE inhibitors. There are no compliance problems.   Hyperlipidemia This is a chronic problem. The current episode started more than 1 year ago. Exacerbating diseases include diabetes. Pertinent negatives include no chest pain, myalgias or shortness of breath. Current antihyperlipidemic treatment includes statins. Risk factors for coronary artery disease include dyslipidemia, diabetes mellitus, hypertension and male sex.     Review of systems: Limited as above.  Objective:    There were no vitals taken for this visit.  Wt Readings from Last 3 Encounters:  06/26/19 229 lb (103.9 kg)  03/12/19 229 lb (103.9 kg)  01/17/19 220 lb (99.8 kg)      Results for orders placed or performed in visit on 07/02/19  Comprehensive metabolic panel  Result Value Ref Range   Glucose, Bld 148 (H) 65 - 99 mg/dL   BUN 33 (H) 7 - 25 mg/dL   Creat 1.81 (H) 0.70 - 1.25 mg/dL   BUN/Creatinine Ratio 18 6 - 22 (calc)   Sodium 138 135 - 146 mmol/L   Potassium 4.7 3.5 - 5.3 mmol/L   Chloride 105 98 - 110 mmol/L   CO2 25 20 - 32 mmol/L   Calcium 9.7 8.6 - 10.3 mg/dL   Total Protein 7.1 6.1 - 8.1 g/dL   Albumin 4.2 3.6 - 5.1 g/dL   Globulin 2.9 1.9 - 3.7 g/dL (calc)   AG Ratio 1.4 1.0 - 2.5 (calc)   Total Bilirubin 0.3 0.2 - 1.2 mg/dL   Alkaline phosphatase (APISO) 87 35 - 144 U/L   AST 15 10 - 35 U/L   ALT 15 9 - 46 U/L  Hemoglobin A1c  Result Value Ref Range   Hgb A1c MFr Bld 8.0 (H) <5.7 % of total Hgb   Mean Plasma Glucose 183 (calc)   eAG (mmol/L) 10.1 (calc)    Diabetic Labs (most recent): Lab Results  Component Value Date   HGBA1C 8.0 (H) 07/02/2019   HGBA1C 7.5 (H) 12/06/2018   HGBA1C 6.3 (H) 06/03/2018   Lipid Panel     Component Value Date/Time   CHOL 119 07/02/2019 0832   TRIG 159 (H) 07/02/2019 0832   HDL 29 (L) 07/02/2019 0832   CHOLHDL 4.1 07/02/2019 0832   VLDL 12 01/16/2017 0940   LDLCALC 66 07/02/2019 0832      Assessment & Plan:   1. diabetes mellitus type 2 without complications, unspecified long term insulin use status ([place]) Mr. Ursin reports significantly improved glycemic profile after he was put on adjusted medications.  His recent A1c was 8% increasing from 6.3%.      Recent labs reviewed, showing mild renal insufficiency. - Patient remains at a high risk for more acute and chronic complications of diabetes which include CAD, CVA, CKD, retinopathy, and neuropathy. These are all discussed in detail with the patient.  - I have re-counseled the patient on diet management by adopting a carbohydrate restricted / protein rich  Diet. - He is advised to stick to a routine mealtimes to eat 3 meals  a day and avoid unnecessary snacks ( to snack only to correct hypoglycemia).  - he  admits there is a room for improvement in his diet and drink choices. -  Suggestion is made for him to avoid simple carbohydrates  from his diet including Cakes, Sweet Desserts / Pastries, Ice Cream, Soda (diet and regular), Sweet Tea, Candies, Chips, Cookies, Sweet Pastries,  Store Bought Juices, Alcohol in Excess of  1-2 drinks a day, Artificial Sweeteners, Coffee Creamer, and "Sugar-free" Products. This will help patient to have stable blood glucose profile and potentially avoid unintended weight gain.    - I have approached patient with the following individualized plan to manage diabetes and patient agrees.   -Based on his presentation, he will not require prandial insulin for now.    -He is advised to continue Lantus 54 units  daily in  the morning with breakfast,  associated with strict monitoring of blood glucose 2 times a day-daily before breakfast and at bedtime. -He is warned not to take insulin without proper monitoring per orders. -Patient is encouraged to call clinic for blood glucose levels less than 70 or above 300 mg /dl. - He is advised to continue continue  VICTOZA 1.8  mg subcutaneous daily before supper, therapeutically suitable for patient. -He is benefiting from glipizide therapy.  He is advised to continue glipizide 5 mg XL p.o. daily at breakfast. -His renal function is stable since last visit, takes only Tylenol when he needs it at this time.  If renal function does not improve by next visit, he will be considered for nephrology referral.  - He is advised to avoid large doses of NSAIDs, and advised to maintain adequate hydration.    - Patient specific target  for A1c; LDL, HDL, Triglycerides, and  Waist Circumference were discussed in detail.  2) BP/HTN: he is advised to home monitor blood pressure and report if > 140/90 on 2 separate readings.    He is advised to continue his current blood pressure medications including benazepril 20 mg p.o. daily.    3) Lipids/HPL: Recent lipid panel showed controlled LDL at 76.  He will continue to benefit from statin therapy, advised to continue lovastatin 20 mg p.o. nightly.      4)  Weight/Diet: CDE consult in progress, exercise, and carbohydrates information provided.  5) Chronic Care/Health Maintenance:  -Patient  on ACEI and Statin medications and encouraged to continue to follow up with Ophthalmology, Podiatrist at least yearly or according to recommendations, and advised to stay away from smoking. I have recommended yearly flu vaccine and pneumonia vaccination at least every 5 years; moderate intensity exercise for up to 150 minutes weekly; and  sleep for at least 7 hours a day.  I advised patient to maintain close follow up with his PCP for primary care  needs.  - Patient Care Time Today:  25 min, of which >50% was spent in  counseling and the rest reviewing his  current and  previous labs/studies, previous treatments, his blood glucose readings, and medications' doses and developing a plan for long-term care based on the latest recommendations for standards of care.   Travis Herrera participated in the discussions, expressed understanding, and voiced agreement with the above plans.  All questions were answered to his satisfaction. he is encouraged to contact clinic should he have any questions or concerns prior to his return visit.   Follow up plan: Return in about 3 months (around 11/06/2019) for Bring Meter and Logs- A1c in Office, Include 8 log sheets.  Glade Lloyd, MD Phone: [phone]  Fax: [phone]  This note was partially dictated with voice recognition software. Similar sounding words can be transcribed inadequately or may not  be corrected upon review.  08/06/2019, 9:21 AM

## 2019-08-15 ENCOUNTER — Other Ambulatory Visit: Payer: Self-pay

## 2019-08-15 DIAGNOSIS — Z20822 Contact with and (suspected) exposure to covid-19: Secondary | ICD-10-CM

## 2019-08-16 LAB — NOVEL CORONAVIRUS, NAA: SARS-CoV-2, NAA: NOT DETECTED

## 2019-09-09 ENCOUNTER — Other Ambulatory Visit: Payer: Self-pay

## 2019-09-09 ENCOUNTER — Encounter: Payer: Self-pay | Admitting: Family Medicine

## 2019-09-09 ENCOUNTER — Ambulatory Visit (INDEPENDENT_AMBULATORY_CARE_PROVIDER_SITE_OTHER): Payer: PPO | Admitting: Family Medicine

## 2019-09-09 VITALS — BP 126/80 | Ht 70.0 in | Wt 219.0 lb

## 2019-09-09 DIAGNOSIS — Z6831 Body mass index (BMI) 31.0-31.9, adult: Secondary | ICD-10-CM | POA: Diagnosis not present

## 2019-09-09 DIAGNOSIS — Z79899 Other long term (current) drug therapy: Secondary | ICD-10-CM

## 2019-09-09 DIAGNOSIS — E1122 Type 2 diabetes mellitus with diabetic chronic kidney disease: Secondary | ICD-10-CM | POA: Diagnosis not present

## 2019-09-09 DIAGNOSIS — I129 Hypertensive chronic kidney disease with stage 1 through stage 4 chronic kidney disease, or unspecified chronic kidney disease: Secondary | ICD-10-CM

## 2019-09-09 DIAGNOSIS — E782 Mixed hyperlipidemia: Secondary | ICD-10-CM

## 2019-09-09 DIAGNOSIS — E669 Obesity, unspecified: Secondary | ICD-10-CM | POA: Diagnosis not present

## 2019-09-09 DIAGNOSIS — I10 Essential (primary) hypertension: Secondary | ICD-10-CM

## 2019-09-09 DIAGNOSIS — E66811 Obesity, class 1: Secondary | ICD-10-CM

## 2019-09-09 DIAGNOSIS — Z794 Long term (current) use of insulin: Secondary | ICD-10-CM | POA: Diagnosis not present

## 2019-09-09 DIAGNOSIS — N182 Chronic kidney disease, stage 2 (mild): Secondary | ICD-10-CM

## 2019-09-09 DIAGNOSIS — E559 Vitamin D deficiency, unspecified: Secondary | ICD-10-CM

## 2019-09-09 DIAGNOSIS — E860 Dehydration: Secondary | ICD-10-CM

## 2019-09-09 MED ORDER — GLIPIZIDE ER 5 MG PO TB24: 5.0000 mg | ORAL_TABLET | Freq: Every day | ORAL | 0 refills | Status: DC

## 2019-09-09 NOTE — Addendum Note (Signed)
Addended by: Eual Fines on: 09/09/2019 03:43 PM   Modules accepted: Orders

## 2019-09-09 NOTE — Assessment & Plan Note (Signed)
RECENTLY DETERIORATED, HOWEVER, IMPROVING BY PT REPORT, FOLLOWED BY [name] is reminded of the importance of commitment to daily physical activity for 30 minutes or more, as able and the need to limit carbohydrate intake to 30 to 60 grams per meal to help with blood sugar control.   The need to take medication as prescribed, test blood sugar as directed, and to call between visits if there is a concern that blood sugar is uncontrolled is also discussed.   Travis Herrera is reminded of the importance of daily foot exam, annual eye examination, and good blood sugar, blood pressure and cholesterol control.  Diabetic Labs Latest Ref Rng & Units 07/02/2019 12/06/2018 08/30/2018 06/03/2018 02/26/2018  HbA1c <5.7 % of total Hgb 8.0(H) 7.5(H) - 6.3(H) 7.0(H)  Microalbumin mg/dL - 1.3 - - -  Micro/Creat Ratio <30 mcg/mg creat - 10 - - -  Chol <200 mg/dL 119 - 143 - 131  HDL > OR = 40 mg/dL 29(L) - 31(L) - 33(L)  Calc LDL mg/dL (calc) 66 - 91 - 80  Triglycerides <150 mg/dL 159(H) - 116 - 96  Creatinine 0.70 - 1.25 mg/dL 1.81(H) 1.81(H) 1.93(H) 1.53(H) 1.48(H)   BP/Weight 09/09/2019 06/26/2019 03/12/2019 01/17/2019 12/13/2018 08/06/2018 [id number]  Systolic BP 123XX123 123456 123456 123456 [id number] [id number]  Diastolic BP 80 70 70 83 73 78 69  Wt. (Lbs) 219 229 229 220 220.06 211.12 213  BMI 31.42 32.86 32.86 31.57 31.58 30.29 30.56   Foot/eye exam completion dates Latest Ref Rng & Units 03/12/2019 02/20/2018  Eye Exam No Retinopathy - -  Foot Form Completion - Done Done

## 2019-09-09 NOTE — Assessment & Plan Note (Signed)
  Patient re-educated about  the importance of commitment to a  minimum of 150 minutes of exercise per week as able.  The importance of healthy food choices with portion control discussed, as well as eating regularly and within a 12 hour window most days. The need to choose "clean , green" food 50 to 75% of the time is discussed, as well as to make water the primary drink and set a goal of 64 ounces water daily.    Weight /BMI 09/09/2019 06/26/2019 03/12/2019  WEIGHT 219 lb 229 lb 229 lb  HEIGHT 5\' 10"  5\' 10"  5\' 10"   BMI 31.42 kg/m2 32.86 kg/m2 32.86 kg/m2

## 2019-09-09 NOTE — Progress Notes (Signed)
Virtual Visit via Telephone Note  I connected with Travis Herrera on 09/09/19 at  8:00 AM EST by telephone and verified that I am speaking with the correct person using two identifiers.  Location: Patient: HOME Provider: OFFICE   I discussed the limitations, risks, security and privacy concerns of performing an evaluation and management service by telephone and the availability of in person appointments. I also discussed with the patient that there may be a patient responsible charge related to this service. The patient expressed understanding and agreed to proceed.   History of Present Illness:   f/u chronic problems, uncontrolled blood sugar has improved with addition of glipizide, denies sugar lows except on one occasion, went down to 70, had not hAD HIS PEANUT BUTTER SNACK, BEDTIME WAS 101, HAS HALF  CUP MILK AND 2 PEANUT BUTTER CRACKLES RIDING 3 TO 4 DAYS / WEEK FOR 45 MINUTES PER SESSION, AND ON OTHER DAYS WALKS FOR AT MOST 15 MINUTES .Denies recent fever or chills. Denies sinus pressure, nasal congestion, ear pain or sore throat. Denies chest congestion, productive cough or wheezing. Denies chest pains, palpitations and leg swelling Denies abdominal pain, nausea, vomiting,diarrhea or constipation.   Denies dysuria, frequency, hesitancy or incontinence. C/O  joint pain, swelling and limitation in mobility. Denies headaches, seizures, numbness, or tingling. Denies depression, anxiety or insomnia. Denies skin break down or rash.     Observations/Objective: BP 126/80   Ht 5\' 10"  (1.778 m)   Wt 219 lb (99.3 kg)   BMI 31.42 kg/m  Good communication with no confusion and intact memory. Alert and oriented x 3 No signs of respiratory distress during speech    Assessment and Plan: Benign hypertension Controlled, no change in medication DASH diet and commitment to daily physical activity for a minimum of 30 minutes discussed and encouraged, as a part of hypertension  management. The importance of attaining a healthy weight is also discussed.  BP/Weight 09/09/2019 06/26/2019 03/12/2019 01/17/2019 12/13/2018 08/06/2018 [id number]  Systolic BP 123XX123 123456 123456 123456 [id number] [id number]  Diastolic BP 80 70 70 83 73 78 69  Wt. (Lbs) 219 229 229 220 220.06 211.12 213  BMI 31.42 32.86 32.86 31.57 31.58 30.29 30.56       Obesity (BMI 30.0-34.9)  Patient re-educated about  the importance of commitment to a  minimum of 150 minutes of exercise per week as able.  The importance of healthy food choices with portion control discussed, as well as eating regularly and within a 12 hour window most days. The need to choose "clean , green" food 50 to 75% of the time is discussed, as well as to make water the primary drink and set a goal of 64 ounces water daily.    Weight /BMI 09/09/2019 06/26/2019 03/12/2019  WEIGHT 219 lb 229 lb 229 lb  HEIGHT 5\' 10"  5\' 10"  5\' 10"   BMI 31.42 kg/m2 32.86 kg/m2 32.86 kg/m2      Type 2 diabetes mellitus with stage 2 chronic kidney disease, with long-term current use of insulin ([facility]) RECENTLY DETERIORATED, HOWEVER, IMPROVING BY PT REPORT, FOLLOWED BY [name] is reminded of the importance of commitment to daily physical activity for 30 minutes or more, as able and the need to limit carbohydrate intake to 30 to 60 grams per meal to help with blood sugar control.   The need to take medication as prescribed, test blood sugar as directed, and to call between visits if there is a concern that blood sugar is uncontrolled  is also discussed.   Travis Herrera is reminded of the importance of daily foot exam, annual eye examination, and good blood sugar, blood pressure and cholesterol control.  Diabetic Labs Latest Ref Rng & Units 07/02/2019 12/06/2018 08/30/2018 06/03/2018 02/26/2018  HbA1c <5.7 % of total Hgb 8.0(H) 7.5(H) - 6.3(H) 7.0(H)  Microalbumin mg/dL - 1.3 - - -  Micro/Creat Ratio <30 mcg/mg creat - 10 - - -  Chol <200 mg/dL 119 - 143 - 131  HDL >  OR = 40 mg/dL 29(L) - 31(L) - 33(L)  Calc LDL mg/dL (calc) 66 - 91 - 80  Triglycerides <150 mg/dL 159(H) - 116 - 96  Creatinine 0.70 - 1.25 mg/dL 1.81(H) 1.81(H) 1.93(H) 1.53(H) 1.48(H)   BP/Weight 09/09/2019 06/26/2019 03/12/2019 01/17/2019 12/13/2018 08/06/2018 [id number]  Systolic BP 123XX123 123456 123456 123456 [id number] [id number]  Diastolic BP 80 70 70 83 73 78 69  Wt. (Lbs) 219 229 229 220 220.06 211.12 213  BMI 31.42 32.86 32.86 31.57 31.58 30.29 30.56   Foot/eye exam completion dates Latest Ref Rng & Units 03/12/2019 02/20/2018  Eye Exam No Retinopathy - -  Foot Form Completion - Done Done        Mixed hyperlipidemia Hyperlipidemia:Low fat diet discussed and encouraged.   Lipid Panel  Lab Results  Component Value Date   CHOL 119 07/02/2019   HDL 29 (L) 07/02/2019   LDLCALC 66 07/02/2019   TRIG 159 (H) 07/02/2019   CHOLHDL 4.1 07/02/2019     NEEDS TO REDUCE FAT IN DIET, WILL GET FOR MAY PHYSICAL EXAM    Follow Up Instructions:    I discussed the assessment and treatment plan with the patient. The patient was provided an opportunity to ask questions and all were answered. The patient agreed with the plan and demonstrated an understanding of the instructions.   The patient was advised to call back or seek an in-person evaluation if the symptoms worsen or if the condition fails to improve as anticipated.  I provided 25 minutes of non-face-to-face time during this encounter.   Tula Nakayama, MD

## 2019-09-09 NOTE — Assessment & Plan Note (Signed)
Hyperlipidemia:Low fat diet discussed and encouraged.   Lipid Panel  Lab Results  Component Value Date   CHOL 119 07/02/2019   HDL 29 (L) 07/02/2019   LDLCALC 66 07/02/2019   TRIG 159 (H) 07/02/2019   CHOLHDL 4.1 07/02/2019     NEEDS TO REDUCE FAT IN DIET, WILL GET FOR MAY PHYSICAL EXAM

## 2019-09-09 NOTE — Patient Instructions (Addendum)
ANNUAL PHYSICAL EXAM IN OFFICE IN MAY 2021 WHEN DUE, CALL IF YOU NEED ME SOONER  PLS CALL AND ARRANGE TIME NEXT WEEK mONDAY FOR PATIENT TO COME IN FOR PNEUMONIA 23 VACCINE  CONTINUE GOOD HEALTH HABITS PLEASE, THEY DO MAKE A DIFFERENCE  FASTING LIPID, CMP AND EGFR, TSH, VIT D AND MICROALB 1 WEEK BEFORE APPT IN MAY  Thanks for choosing [facility], we consider it a privelige to serve you.  It is important that you exercise regularly at least 30 minutes 5 times a week. If you develop chest pain, have severe difficulty breathing, or feel very tired, stop exercising immediately and seek medical attention   CONTINUE TO [place]  Thanks for choosing [facility], we consider it a privelige to serve you.

## 2019-09-09 NOTE — Assessment & Plan Note (Signed)
Controlled, no change in medication DASH diet and commitment to daily physical activity for a minimum of 30 minutes discussed and encouraged, as a part of hypertension management. The importance of attaining a healthy weight is also discussed.  BP/Weight 09/09/2019 06/26/2019 03/12/2019 01/17/2019 12/13/2018 08/06/2018 [id number]  Systolic BP 123XX123 123456 123456 123456 [id number] [id number]  Diastolic BP 80 70 70 83 73 78 69  Wt. (Lbs) 219 229 229 220 220.06 211.12 213  BMI 31.42 32.86 32.86 31.57 31.58 30.29 30.56

## 2019-09-15 ENCOUNTER — Ambulatory Visit: Payer: PPO

## 2019-09-15 ENCOUNTER — Other Ambulatory Visit: Payer: Self-pay

## 2019-09-15 ENCOUNTER — Ambulatory Visit (INDEPENDENT_AMBULATORY_CARE_PROVIDER_SITE_OTHER): Payer: PPO

## 2019-09-15 DIAGNOSIS — Z23 Encounter for immunization: Secondary | ICD-10-CM | POA: Diagnosis not present

## 2019-09-15 NOTE — Progress Notes (Signed)
Patient given Pneu 23 in left deltoid. Tolerated well. Patient given VIS

## 2019-09-16 ENCOUNTER — Ambulatory Visit: Payer: PPO

## 2019-09-29 ENCOUNTER — Other Ambulatory Visit: Payer: Self-pay

## 2019-09-29 MED ORDER — TRIAMTERENE-HCTZ 37.5-25 MG PO TABS: 1.0000 | ORAL_TABLET | Freq: Every day | ORAL | 0 refills | Status: DC

## 2019-09-29 MED ORDER — LOVASTATIN 20 MG PO TABS: 20.0000 mg | ORAL_TABLET | Freq: Every day | ORAL | 0 refills | Status: DC

## 2019-09-29 MED ORDER — BENAZEPRIL HCL 20 MG PO TABS: 20.0000 mg | ORAL_TABLET | Freq: Every day | ORAL | 0 refills | Status: DC

## 2019-09-30 ENCOUNTER — Ambulatory Visit: Payer: PPO | Attending: Internal Medicine

## 2019-09-30 ENCOUNTER — Other Ambulatory Visit: Payer: Self-pay

## 2019-09-30 DIAGNOSIS — Z20828 Contact with and (suspected) exposure to other viral communicable diseases: Secondary | ICD-10-CM | POA: Diagnosis not present

## 2019-09-30 DIAGNOSIS — Z20822 Contact with and (suspected) exposure to covid-19: Secondary | ICD-10-CM

## 2019-10-01 LAB — NOVEL CORONAVIRUS, NAA: SARS-CoV-2, NAA: NOT DETECTED

## 2019-10-31 DIAGNOSIS — Z794 Long term (current) use of insulin: Secondary | ICD-10-CM | POA: Diagnosis not present

## 2019-10-31 DIAGNOSIS — E782 Mixed hyperlipidemia: Secondary | ICD-10-CM | POA: Diagnosis not present

## 2019-10-31 DIAGNOSIS — R946 Abnormal results of thyroid function studies: Secondary | ICD-10-CM | POA: Diagnosis not present

## 2019-10-31 DIAGNOSIS — N182 Chronic kidney disease, stage 2 (mild): Secondary | ICD-10-CM | POA: Diagnosis not present

## 2019-10-31 DIAGNOSIS — E1122 Type 2 diabetes mellitus with diabetic chronic kidney disease: Secondary | ICD-10-CM | POA: Diagnosis not present

## 2019-10-31 DIAGNOSIS — E559 Vitamin D deficiency, unspecified: Secondary | ICD-10-CM | POA: Diagnosis not present

## 2019-10-31 DIAGNOSIS — I10 Essential (primary) hypertension: Secondary | ICD-10-CM | POA: Diagnosis not present

## 2019-11-04 ENCOUNTER — Other Ambulatory Visit: Payer: Self-pay | Admitting: Family Medicine

## 2019-11-04 LAB — COMPLETE METABOLIC PANEL WITH GFR
AG Ratio: 1.4 (calc) (ref 1.0–2.5)
ALT: 19 U/L (ref 9–46)
AST: 21 U/L (ref 10–35)
Albumin: 4.2 g/dL (ref 3.6–5.1)
Alkaline phosphatase (APISO): 86 U/L (ref 35–144)
BUN/Creatinine Ratio: 21 (calc) (ref 6–22)
BUN: 36 mg/dL — ABNORMAL HIGH (ref 7–25)
CO2: 28 mmol/L (ref 20–32)
Calcium: 10.4 mg/dL — ABNORMAL HIGH (ref 8.6–10.3)
Chloride: 104 mmol/L (ref 98–110)
Creat: 1.74 mg/dL — ABNORMAL HIGH (ref 0.70–1.25)
GFR, Est African American: 48 mL/min/{1.73_m2} — ABNORMAL LOW (ref >=60)
GFR, Est Non African American: 41 mL/min/{1.73_m2} — ABNORMAL LOW (ref >=60)
Globulin: 2.9 g/dL (calc) (ref 1.9–3.7)
Glucose, Bld: 110 mg/dL — ABNORMAL HIGH (ref 65–99)
Potassium: 4.3 mmol/L (ref 3.5–5.3)
Sodium: 140 mmol/L (ref 135–146)
Total Bilirubin: 0.3 mg/dL (ref 0.2–1.2)
Total Protein: 7.1 g/dL (ref 6.1–8.1)

## 2019-11-04 LAB — LIPID PANEL
Cholesterol: 116 mg/dL (ref <200)
HDL: 28 mg/dL — ABNORMAL LOW (ref >=40)
LDL Cholesterol (Calc): 64 mg/dL (calc)
Non-HDL Cholesterol (Calc): 88 mg/dL (calc) (ref <130)
Total CHOL/HDL Ratio: 4.1 (calc) (ref <5.0)
Triglycerides: 164 mg/dL — ABNORMAL HIGH (ref <150)

## 2019-11-04 LAB — TEST AUTHORIZATION

## 2019-11-04 LAB — T4, FREE: Free T4: 1.4 ng/dL (ref 0.8–1.8)

## 2019-11-04 LAB — TSH: TSH: 0.33 mIU/L — ABNORMAL LOW (ref 0.40–4.50)

## 2019-11-04 LAB — MICROALBUMIN / CREATININE URINE RATIO
Creatinine, Urine: 116 mg/dL (ref 20–320)
Microalb Creat Ratio: 9 mcg/mg creat (ref <30)
Microalb, Ur: 1 mg/dL

## 2019-11-04 LAB — VITAMIN D 25 HYDROXY (VIT D DEFICIENCY, FRACTURES): Vit D, 25-Hydroxy: 37 ng/mL (ref 30–100)

## 2019-11-04 LAB — T3, FREE: T3, Free: 3.5 pg/mL (ref 2.3–4.2)

## 2019-11-06 NOTE — Addendum Note (Signed)
Addended by: Eual Fines on: 11/06/2019 11:41 AM   Modules accepted: Orders

## 2019-11-07 ENCOUNTER — Other Ambulatory Visit: Payer: Self-pay

## 2019-11-07 ENCOUNTER — Ambulatory Visit (INDEPENDENT_AMBULATORY_CARE_PROVIDER_SITE_OTHER): Payer: PPO | Admitting: "Endocrinology

## 2019-11-07 ENCOUNTER — Encounter: Payer: Self-pay | Admitting: "Endocrinology

## 2019-11-07 VITALS — BP 114/72 | HR 57 | Ht 70.0 in | Wt 227.6 lb

## 2019-11-07 DIAGNOSIS — Z794 Long term (current) use of insulin: Secondary | ICD-10-CM

## 2019-11-07 DIAGNOSIS — E1122 Type 2 diabetes mellitus with diabetic chronic kidney disease: Secondary | ICD-10-CM

## 2019-11-07 DIAGNOSIS — E782 Mixed hyperlipidemia: Secondary | ICD-10-CM | POA: Diagnosis not present

## 2019-11-07 DIAGNOSIS — I10 Essential (primary) hypertension: Secondary | ICD-10-CM

## 2019-11-07 DIAGNOSIS — N182 Chronic kidney disease, stage 2 (mild): Secondary | ICD-10-CM

## 2019-11-07 LAB — POCT GLYCOSYLATED HEMOGLOBIN (HGB A1C): Hemoglobin A1C: 6.9 % — AB (ref 4.0–5.6)

## 2019-11-07 MED ORDER — LANTUS SOLOSTAR 100 UNIT/ML ~~LOC~~ SOPN: 50.0000 [IU] | PEN_INJECTOR | Freq: Every day | SUBCUTANEOUS | 2 refills | Status: DC

## 2019-11-07 NOTE — Progress Notes (Signed)
11/07/2019                                                    Endocrinology Telehealth Visit Follow up Note -During COVID -19 Pandemic  This visit type was conducted due to national recommendations for restrictions regarding the COVID-19 Pandemic  in an effort to limit this patient's exposure and mitigate transmission of the corona virus.  Due to his co-morbid illnesses, Travis Herrera is at  moderate to high risk for complications without adequate follow up.  This format is felt to be most appropriate for him at this time.  I connected with this patient on 11/07/2019   by telephone and verified that I am speaking with the correct person using two identifiers. Travis Herrera, 07/25/1956. he has verbally consented to this visit. All issues noted in this document were discussed and addressed. The format was not optimal for physical exam.   Subjective:    Patient ID: Travis Herrera, male    DOB: 05/13/1956,    Past Medical History:  Diagnosis Date  . Diabetes mellitus without complication ([place]) [id number]   insulin started in 2012  . Hyperlipemia 2013  . Hypertension 2010   Past Surgical History:  Procedure Laterality Date  . CATARACT EXTRACTION W/PHACO  07/29/2012   Procedure: CATARACT EXTRACTION PHACO AND INTRAOCULAR LENS PLACEMENT (IOC);  Surgeon: Tonny Branch, MD;  Location: [facility];  Service: Ophthalmology;  Laterality: Left;  CDE=1.66  . CHOLECYSTECTOMY  2007   [facility]  . EYE SURGERY Left 2013   cataract  . QUADRICEPS TENDON REPAIR Right 03/19/2018   Procedure: REPAIR QUADRICEP TENDON;  Surgeon: Carole Civil, MD;  Location: [facility];  Service: Orthopedics;  Laterality: Right;   Social History   Socioeconomic History  . Marital status: Married    Spouse name: Not on file  . Number of children: 4  . Years of education: Not on file  . Highest education level: Not on file  Occupational History  . Not on file  Tobacco Use  . Smoking status: Never Smoker  . Smokeless  tobacco: Never Used  Substance and Sexual Activity  . Alcohol use: No  . Drug use: No  . Sexual activity: Yes    Birth control/protection: None  Other Topics Concern  . Not on file  Social History Narrative  . Not on file   Social Determinants of Health   Financial Resource Strain:   . Difficulty of Paying Living Expenses: Not on file  Food Insecurity:   . Worried About Charity fundraiser in the Last Year: Not on file  . Ran Out of Food in the Last Year: Not on file  Transportation Needs:   . Lack of Transportation (Medical): Not on file  . Lack of Transportation (Non-Medical): Not on file  Physical Activity:   . Days of Exercise per Week: Not on file  . Minutes of Exercise per Session: Not on file  Stress:   . Feeling of Stress : Not on file  Social Connections:   . Frequency of Communication with Friends and Family: Not on file  . Frequency of Social Gatherings with Friends and Family: Not on file  . Attends Religious Services: Not on file  . Active Member of Clubs or Organizations: Not on file  . Attends Club or  Organization Meetings: Not on file  . Marital Status: Not on file   Outpatient Encounter Medications as of 11/07/2019  Medication Sig  . amLODipine (NORVASC) 10 MG tablet Take 1 tablet (10 mg total) by mouth daily.  Marland Kitchen aspirin EC 81 MG tablet Take 81 mg by mouth daily.  . benazepril (LOTENSIN) 20 MG tablet Take 1 tablet (20 mg total) by mouth daily.  Marland Kitchen glipiZIDE (GLUCOTROL XL) 5 MG 24 hr tablet Take 1 tablet (5 mg total) by mouth daily with breakfast.  . glucose blood (ONETOUCH VERIO) test strip One touch verio strips- three times daily testing dx e11.65  . Insulin Glargine (LANTUS SOLOSTAR) 100 UNIT/ML Solostar Pen Inject 50 Units into the skin daily with breakfast.  . Insulin Pen Needle 31G X 8 MM MISC 1 each by Does not apply route 2 (two) times daily.  . Lancets (FREESTYLE) lancets Use as instructed bid  . liraglutide (VICTOZA) 18 MG/3ML SOPN Inject 0.3 mLs  (1.8 mg total) into the skin every morning. INJECT 1.8 MG INTO THE SKIN DAILY AS DIRECTED  . lovastatin (MEVACOR) 20 MG tablet Take 1 tablet (20 mg total) by mouth at bedtime.  . montelukast (SINGULAIR) 10 MG tablet TAKE 1 TABLET BY MOUTH AT BEDTIME  . ONETOUCH DELICA LANCETS 99991111 MISC Three times daily testing dx e11.65  . Saw Palmetto 1000 MG CAPS Take 1,000 mg by mouth daily.  . sildenafil (VIAGRA) 100 MG tablet Take 1 tablet (100 mg total) by mouth daily as needed for erectile dysfunction.  . triamterene-hydrochlorothiazide (MAXZIDE-25) 37.5-25 MG tablet Take 1 tablet by mouth daily.  . [DISCONTINUED] Insulin Glargine (LANTUS SOLOSTAR) 100 UNIT/ML Solostar Pen Inject 54 Units into the skin daily with breakfast.   No facility-administered encounter medications on file as of 11/07/2019.   ALLERGIES: No Known Allergies VACCINATION STATUS: Immunization History  Administered Date(s) Administered  . Influenza Split 07/17/2014  . Influenza,inj,Quad PF,6+ Mos 08/16/2015, 07/26/2016, 08/02/2017, 07/19/2018, 06/16/2019  . Pneumococcal Polysaccharide-23 09/15/2019    Diabetes He presents for his follow-up diabetic visit. He has type 2 diabetes mellitus. Onset time: He was diagnosed at approximate age of 28 years. His disease course has been improving. There are no hypoglycemic associated symptoms. Pertinent negatives for hypoglycemia include no confusion, headaches, pallor or seizures. There are no diabetic associated symptoms. Pertinent negatives for diabetes include no chest pain, no fatigue, no polydipsia, no polyphagia, no polyuria and no weakness. There are no hypoglycemic complications. Symptoms are improving. Diabetic complications include nephropathy. Risk factors for coronary artery disease include diabetes mellitus, dyslipidemia, hypertension, family history, male sex and sedentary lifestyle. Current diabetic treatments: He is on Lantus 50 units daily at breakfast and Victoza 1.8 mg  subcutaneously daily. He is compliant with treatment most of the time. His weight is fluctuating minimally. He is following a generally unhealthy diet. He rarely participates in exercise. His home blood glucose trend is decreasing steadily. His breakfast blood glucose range is generally 110-130 mg/dl. His bedtime blood glucose range is generally 140-180 mg/dl. His overall blood glucose range is 140-180 mg/dl. An ACE inhibitor/angiotensin II receptor blocker is being taken. Eye exam is current.  Hypertension This is a chronic problem. The current episode started more than 1 year ago. The problem has been gradually improving since onset. The problem is controlled. Pertinent negatives include no chest pain, headaches, neck pain, palpitations or shortness of breath. Risk factors for coronary artery disease include dyslipidemia, diabetes mellitus and male gender. Past treatments include ACE inhibitors. There are  no compliance problems.   Hyperlipidemia This is a chronic problem. The current episode started more than 1 year ago. Exacerbating diseases include diabetes. Pertinent negatives include no chest pain, myalgias or shortness of breath. Current antihyperlipidemic treatment includes statins. Risk factors for coronary artery disease include dyslipidemia, diabetes mellitus, hypertension and male sex.     Review of systems:  Constitutional: + Fluctuating weight , no fatigue, no subjective hyperthermia, no subjective hypothermia Eyes: no blurry vision, no xerophthalmia ENT: no sore throat, no nodules palpated in throat, no dysphagia/odynophagia, no hoarseness Cardiovascular: no Chest Pain, no Shortness of Breath, no palpitations, no leg swelling Respiratory: no cough, no SOB Gastrointestinal: no Nausea/Vomiting/Diarhhea Musculoskeletal: no muscle/joint aches , + bilateral arthritis status post right knee replacement. Skin: no rashes Neurological: no tremors, no numbness, no tingling, no  dizziness Psychiatric: no depression, no anxiety   Objective:    BP 114/72   Pulse (!) 57   Ht 5\' 10"  (1.778 m)   Wt 227 lb 9.6 oz (103.2 kg)   BMI 32.66 kg/m   Wt Readings from Last 3 Encounters:  11/07/19 227 lb 9.6 oz (103.2 kg)  09/09/19 219 lb (99.3 kg)  06/26/19 229 lb (103.9 kg)     Physical Exam- Limited  Constitutional:  Body mass index is 32.66 kg/m. , not in acute distress, normal state of mind Eyes:  EOMI, no exophthalmos Neck: Supple Respiratory: Adequate breathing efforts Musculoskeletal: no gross deformities, strength intact in all four extremities, no gross restriction of joint movements Skin:  no rashes, no hyperemia Neurological: no tremor with outstretched hands.   Results for orders placed or performed in visit on 11/07/19  HgB A1c  Result Value Ref Range   Hemoglobin A1C 6.9 (A) 4.0 - 5.6 %   HbA1c POC (<> result, manual entry)     HbA1c, POC (prediabetic range)     HbA1c, POC (controlled diabetic range)     Diabetic Labs (most recent): Lab Results  Component Value Date   HGBA1C 6.9 (A) 11/07/2019   HGBA1C 8.0 (H) 07/02/2019   HGBA1C 7.5 (H) 12/06/2018   Lipid Panel     Component Value Date/Time   CHOL 116 10/31/2019 0737   TRIG 164 (H) 10/31/2019 0737   HDL 28 (L) 10/31/2019 0737   CHOLHDL 4.1 10/31/2019 0737   VLDL 12 01/16/2017 0940   LDLCALC 64 10/31/2019 0737      Assessment & Plan:   1. diabetes mellitus type 2 without complications, unspecified long term insulin use status ([place]) Mr. Zappa reports significantly improved glycemic profile after he was put on adjusted medications.  -He returns with controlled glycemic profile, logs to be scanned.  His point-of-care A1c 6.9% improving from 8%.     Recent labs reviewed, showing mild renal insufficiency. - Patient remains at a high risk for more acute and chronic complications of diabetes which include CAD, CVA, CKD, retinopathy, and neuropathy. These are all discussed in detail  with the patient.  - I have re-counseled the patient on diet management by adopting a carbohydrate restricted / protein rich  Diet. - He is advised to stick to a routine mealtimes to eat 3 meals  a day and avoid unnecessary snacks ( to snack only to correct hypoglycemia).  - he  admits there is a room for improvement in his diet and drink choices. -  Suggestion is made for him to avoid simple carbohydrates  from his diet including Cakes, Sweet Desserts / Pastries, Ice Cream, Soda (diet and  regular), Sweet Tea, Candies, Chips, Cookies, Sweet Pastries,  Store Bought Juices, Alcohol in Excess of  1-2 drinks a day, Artificial Sweeteners, Coffee Creamer, and "Sugar-free" Products. This will help patient to have stable blood glucose profile and potentially avoid unintended weight gain.  - I have approached patient with the following individualized plan to manage diabetes and patient agrees.   -Based on his presentation, he will not require prandial insulin for now.    -He is advised to lower her Lantus to 50 units  daily in the morning with breakfast,   associated with strict monitoring of blood glucose 2 times a day-daily before breakfast and at bedtime. -He is warned not to take insulin without proper monitoring per orders. -Patient is encouraged to call clinic for blood glucose levels less than 70 or above 300 mg /dl. - He is advised to continue continue Victoza  1.8  mg subcutaneous daily before supper, therapeutically suitable for patient. -He is benefiting from glipizide therapy.  He is advised to continue glipizide 5 mg XL p.o. daily at breakfast. -His renal function is stable since last visit, takes only Tylenol when he needs it at this time.  If renal function does not improve by next visit, he will be considered for nephrology referral.  - He is advised to avoid large doses of NSAIDs, and advised to maintain adequate hydration.    - Patient specific target  for A1c; LDL, HDL, Triglycerides,  and  Waist Circumference were discussed in detail.  2) BP/HTN: His blood pressure is controlled to target.    He is advised to continue his current blood pressure medications including benazepril 20 mg p.o. daily.    3) Lipids/HPL: Recent lipid panel showed controlled LDL at 76.  He will continue to benefit from statin therapy, advised to continue lovastatin 20 mg p.o. nightly.     4)  Weight/Diet: CDE consult in progress, exercise, and carbohydrates information provided.  5) Chronic Care/Health Maintenance:  -Patient  on ACEI and Statin medications and encouraged to continue to follow up with Ophthalmology, Podiatrist at least yearly or according to recommendations, and advised to stay away from smoking. I have recommended yearly flu vaccine and pneumonia vaccination at least every 5 years; moderate intensity exercise for up to 150 minutes weekly; and  sleep for at least 7 hours a day.  I advised patient to maintain close follow up with his PCP for primary care needs.  - Time spent on this patient care encounter:  35 min, of which > 50% was spent in  counseling and the rest reviewing his blood glucose logs , discussing his hypoglycemia and hyperglycemia episodes, reviewing his current and  previous labs / studies  ( including abstraction from other facilities) and medications  doses and developing a  long term treatment plan and documenting his care.   Please refer to Patient Instructions for Blood Glucose Monitoring and Insulin/Medications Dosing Guide"  in media tab for additional information. Please  also refer to " Patient Self Inventory" in the Media  tab for reviewed elements of pertinent patient history.  Travis Herrera participated in the discussions, expressed understanding, and voiced agreement with the above plans.  All questions were answered to his satisfaction. he is encouraged to contact clinic should he have any questions or concerns prior to his return visit.    Follow up  plan: Return in about 4 months (around 03/06/2020) for Bring Meter and Logs- A1c in Office.  Glade Lloyd, MD Phone: [phone]  Fax: [phone]  This note was partially dictated with voice recognition software. Similar sounding words can be transcribed inadequately or may not  be corrected upon review.  11/07/2019, 1:38 PM

## 2019-11-07 NOTE — Patient Instructions (Signed)

## 2019-11-09 ENCOUNTER — Telehealth: Payer: Self-pay | Admitting: Family Medicine

## 2019-11-09 NOTE — Telephone Encounter (Signed)
Pls discuss result not e with pt and order the rept lab to be drawn 2nd week in feb to re assess kidney function when hydration is better.Result of thyroid function test is that it appears to be normal  ??/concerns, please ask

## 2019-11-10 NOTE — Telephone Encounter (Signed)
Already discussed and he is aware to have lab repeated in feb

## 2019-12-09 ENCOUNTER — Other Ambulatory Visit: Payer: Self-pay | Admitting: Family Medicine

## 2019-12-09 ENCOUNTER — Telehealth: Payer: Self-pay

## 2019-12-09 ENCOUNTER — Other Ambulatory Visit: Payer: Self-pay | Admitting: *Deleted

## 2019-12-09 MED ORDER — MONTELUKAST SODIUM 10 MG PO TABS: 10.0000 mg | ORAL_TABLET | Freq: Every day | ORAL | 0 refills | Status: DC

## 2019-12-09 NOTE — Telephone Encounter (Signed)
Please call in Singular 10mg  for 90 day supply

## 2019-12-09 NOTE — Telephone Encounter (Signed)
LVM letting pt know medication was called into pharmacy

## 2019-12-11 ENCOUNTER — Other Ambulatory Visit: Payer: Self-pay

## 2019-12-11 MED ORDER — MONTELUKAST SODIUM 10 MG PO TABS: 10.0000 mg | ORAL_TABLET | Freq: Every day | ORAL | 1 refills | Status: DC

## 2019-12-27 ENCOUNTER — Ambulatory Visit: Payer: PPO | Attending: Internal Medicine

## 2019-12-27 DIAGNOSIS — Z23 Encounter for immunization: Secondary | ICD-10-CM

## 2019-12-27 NOTE — Progress Notes (Signed)
   Covid-19 Vaccination Clinic  Name:  Travis Herrera    MRN: [id number] DOB: Oct 15, 1956  12/27/2019  [name] was observed post Covid-19 immunization for 15 minutes without incident. He was provided with Vaccine Information Sheet and instruction to access the V-Safe system.   [name] was instructed to call 911 with any severe reactions post vaccine: Marland Kitchen Difficulty breathing  . Swelling of face and throat  . A fast heartbeat  . A bad rash all over body  . Dizziness and weakness   Immunizations Administered    Name Date Dose VIS Date Route   Moderna COVID-19 Vaccine 12/27/2019  9:50 AM 0.5 mL 09/16/2019 Intramuscular   Manufacturer: Moderna   Lot: [id number]   [id number]

## 2019-12-31 ENCOUNTER — Other Ambulatory Visit: Payer: Self-pay | Admitting: Family Medicine

## 2019-12-31 ENCOUNTER — Other Ambulatory Visit: Payer: Self-pay | Admitting: "Endocrinology

## 2020-01-20 ENCOUNTER — Other Ambulatory Visit: Payer: Self-pay | Admitting: *Deleted

## 2020-01-20 MED ORDER — AMLODIPINE BESYLATE 10 MG PO TABS: 10.0000 mg | ORAL_TABLET | Freq: Every day | ORAL | 3 refills | Status: DC

## 2020-01-28 ENCOUNTER — Ambulatory Visit: Payer: PPO | Attending: Internal Medicine

## 2020-01-28 DIAGNOSIS — Z23 Encounter for immunization: Secondary | ICD-10-CM

## 2020-01-28 NOTE — Progress Notes (Signed)
   Covid-19 Vaccination Clinic  Name:  Travis Herrera    MRN: [id number] DOB: 1956/05/10  01/28/2020  [name] was observed post Covid-19 immunization for 15 minutes without incident. He was provided with Vaccine Information Sheet and instruction to access the V-Safe system.   [name] was instructed to call 911 with any severe reactions post vaccine: Marland Kitchen Difficulty breathing  . Swelling of face and throat  . A fast heartbeat  . A bad rash all over body  . Dizziness and weakness   Immunizations Administered    Name Date Dose VIS Date Route   Moderna COVID-19 Vaccine 01/28/2020  9:07 AM 0.5 mL 09/16/2019 Intramuscular   Manufacturer: Moderna   Lot: [id number]   [id number]

## 2020-02-22 ENCOUNTER — Encounter: Payer: Self-pay | Admitting: Family Medicine

## 2020-02-23 ENCOUNTER — Other Ambulatory Visit: Payer: Self-pay | Admitting: *Deleted

## 2020-02-23 DIAGNOSIS — E782 Mixed hyperlipidemia: Secondary | ICD-10-CM

## 2020-02-23 DIAGNOSIS — E1122 Type 2 diabetes mellitus with diabetic chronic kidney disease: Secondary | ICD-10-CM

## 2020-02-23 DIAGNOSIS — N182 Chronic kidney disease, stage 2 (mild): Secondary | ICD-10-CM

## 2020-02-23 DIAGNOSIS — I10 Essential (primary) hypertension: Secondary | ICD-10-CM

## 2020-02-23 DIAGNOSIS — E559 Vitamin D deficiency, unspecified: Secondary | ICD-10-CM

## 2020-02-25 ENCOUNTER — Encounter: Payer: PPO | Admitting: Family Medicine

## 2020-03-08 ENCOUNTER — Other Ambulatory Visit: Payer: Self-pay

## 2020-03-08 ENCOUNTER — Ambulatory Visit (INDEPENDENT_AMBULATORY_CARE_PROVIDER_SITE_OTHER): Payer: PPO | Admitting: "Endocrinology

## 2020-03-08 ENCOUNTER — Encounter: Payer: Self-pay | Admitting: "Endocrinology

## 2020-03-08 VITALS — BP 104/69 | HR 67 | Ht 70.0 in | Wt 226.4 lb

## 2020-03-08 DIAGNOSIS — E1122 Type 2 diabetes mellitus with diabetic chronic kidney disease: Secondary | ICD-10-CM

## 2020-03-08 DIAGNOSIS — N182 Chronic kidney disease, stage 2 (mild): Secondary | ICD-10-CM | POA: Diagnosis not present

## 2020-03-08 DIAGNOSIS — I10 Essential (primary) hypertension: Secondary | ICD-10-CM | POA: Diagnosis not present

## 2020-03-08 DIAGNOSIS — E782 Mixed hyperlipidemia: Secondary | ICD-10-CM | POA: Diagnosis not present

## 2020-03-08 DIAGNOSIS — Z794 Long term (current) use of insulin: Secondary | ICD-10-CM

## 2020-03-08 LAB — POCT GLYCOSYLATED HEMOGLOBIN (HGB A1C): Hemoglobin A1C: 7 % — AB (ref 4.0–5.6)

## 2020-03-08 NOTE — Progress Notes (Signed)
03/08/2020  Endocrinology follow-up note   Subjective:    Patient ID: Travis Herrera, male    DOB: 11-17-1955,    Past Medical History:  Diagnosis Date  . Diabetes mellitus without complication ([place]) [id number]   insulin started in 2012  . Hyperlipemia 2013  . Hypertension 2010   Past Surgical History:  Procedure Laterality Date  . CATARACT EXTRACTION W/PHACO  07/29/2012   Procedure: CATARACT EXTRACTION PHACO AND INTRAOCULAR LENS PLACEMENT (IOC);  Surgeon: Tonny Branch, MD;  Location: [facility];  Service: Ophthalmology;  Laterality: Left;  CDE=1.66  . CHOLECYSTECTOMY  2007   [facility]  . EYE SURGERY Left 2013   cataract  . QUADRICEPS TENDON REPAIR Right 03/19/2018   Procedure: REPAIR QUADRICEP TENDON;  Surgeon: Carole Civil, MD;  Location: [facility];  Service: Orthopedics;  Laterality: Right;   Social History   Socioeconomic History  . Marital status: Married    Spouse name: Not on file  . Number of children: 4  . Years of education: Not on file  . Highest education level: Not on file  Occupational History  . Not on file  Tobacco Use  . Smoking status: Never Smoker  . Smokeless tobacco: Never Used  Substance and Sexual Activity  . Alcohol use: No  . Drug use: No  . Sexual activity: Yes    Birth control/protection: None  Other Topics Concern  . Not on file  Social History Narrative  . Not on file   Social Determinants of Health   Financial Resource Strain:   . Difficulty of Paying Living Expenses:   Food Insecurity:   . Worried About Charity fundraiser in the Last Year:   . Arboriculturist in the Last Year:   Transportation Needs:   . Film/video editor (Medical):   Marland Kitchen Lack of Transportation (Non-Medical):   Physical Activity:   . Days of Exercise per Week:   . Minutes of Exercise per Session:   Stress:   . Feeling of Stress :   Social Connections:   . Frequency of Communication with Friends and Family:   . Frequency of Social Gatherings with Friends  and Family:   . Attends Religious Services:   . Active Member of Clubs or Organizations:   . Attends Archivist Meetings:   Marland Kitchen Marital Status:    Outpatient Encounter Medications as of 03/08/2020  Medication Sig  . amLODipine (NORVASC) 10 MG tablet Take 1 tablet (10 mg total) by mouth daily.  Marland Kitchen aspirin EC 81 MG tablet Take 81 mg by mouth daily.  . benazepril (LOTENSIN) 20 MG tablet Take 1 tablet by mouth once daily  . glipiZIDE (GLUCOTROL XL) 5 MG 24 hr tablet Take 1 tablet by mouth once daily with breakfast  . glucose blood (ONETOUCH VERIO) test strip One touch verio strips- three times daily testing dx e11.65  . Insulin Glargine (LANTUS SOLOSTAR) 100 UNIT/ML Solostar Pen Inject 50 Units into the skin daily with breakfast.  . Insulin Pen Needle 31G X 8 MM MISC 1 each by Does not apply route 2 (two) times daily.  . Lancets (FREESTYLE) lancets Use as instructed bid  . liraglutide (VICTOZA) 18 MG/3ML SOPN Inject 0.3 mLs (1.8 mg total) into the skin every morning. INJECT 1.8 MG INTO THE SKIN DAILY AS DIRECTED  . lovastatin (MEVACOR) 20 MG tablet TAKE 1 TABLET BY MOUTH AT BEDTIME  . montelukast (SINGULAIR) 10 MG tablet Take 1 tablet (10 mg total) by  mouth at bedtime.  Glory Rosebush DELICA LANCETS [id number] MISC Three times daily testing dx e11.65  . Saw Palmetto 1000 MG CAPS Take 1,000 mg by mouth daily.  . sildenafil (VIAGRA) 100 MG tablet Take 1 tablet (100 mg total) by mouth daily as needed for erectile dysfunction.  . triamterene-hydrochlorothiazide (MAXZIDE-25) 37.5-25 MG tablet Take 1 tablet by mouth once daily   No facility-administered encounter medications on file as of 03/08/2020.   ALLERGIES: No Known Allergies VACCINATION STATUS: Immunization History  Administered Date(s) Administered  . Influenza Split 07/17/2014  . Influenza,inj,Quad PF,6+ Mos 08/16/2015, 07/26/2016, 08/02/2017, 07/19/2018, 06/16/2019  . Moderna SARS-COVID-2 Vaccination 12/27/2019, 01/28/2020  . Pneumococcal  Polysaccharide-23 09/15/2019    Diabetes He presents for his follow-up diabetic visit. He has type 2 diabetes mellitus. Onset time: He was diagnosed at approximate age of 18 years. His disease course has been stable. There are no hypoglycemic associated symptoms. Pertinent negatives for hypoglycemia include no confusion, headaches, pallor or seizures. There are no diabetic associated symptoms. Pertinent negatives for diabetes include no chest pain, no fatigue, no polydipsia, no polyphagia, no polyuria and no weakness. There are no hypoglycemic complications. Symptoms are stable. Diabetic complications include nephropathy. Risk factors for coronary artery disease include diabetes mellitus, dyslipidemia, hypertension, family history, male sex and sedentary lifestyle. Current diabetic treatments: He is on Lantus 50 units daily at breakfast and Victoza 1.8 mg subcutaneously daily. He is compliant with treatment most of the time. His weight is fluctuating minimally. He is following a generally unhealthy diet. He rarely participates in exercise. His home blood glucose trend is fluctuating minimally. His breakfast blood glucose range is generally 110-130 mg/dl. His bedtime blood glucose range is generally 140-180 mg/dl. His overall blood glucose range is 140-180 mg/dl. An ACE inhibitor/angiotensin II receptor blocker is being taken. Eye exam is current.  Hypertension This is a chronic problem. The current episode started more than 1 year ago. The problem has been gradually improving since onset. The problem is controlled. Pertinent negatives include no chest pain, headaches, neck pain, palpitations or shortness of breath. Risk factors for coronary artery disease include dyslipidemia, diabetes mellitus and male gender. Past treatments include ACE inhibitors. There are no compliance problems.   Hyperlipidemia This is a chronic problem. The current episode started more than 1 year ago. Exacerbating diseases include  diabetes. Pertinent negatives include no chest pain, myalgias or shortness of breath. Current antihyperlipidemic treatment includes statins. Risk factors for coronary artery disease include dyslipidemia, diabetes mellitus, hypertension and male sex.    Review of systems  Constitutional: + Minimally fluctuating body weight,  current  Body mass index is 32.49 kg/m. , no fatigue, no subjective hyperthermia, no subjective hypothermia Eyes: no blurry vision, no xerophthalmia ENT: no sore throat, no nodules palpated in throat, no dysphagia/odynophagia, no hoarseness Cardiovascular: no Chest Pain, no Shortness of Breath, no palpitations, no leg swelling Respiratory: no cough, no shortness of breath Gastrointestinal: no Nausea/Vomiting/Diarhhea Musculoskeletal: no muscle/joint aches Skin: no rashes, no hyperemia Neurological: no tremors, no numbness, no tingling, no dizziness Psychiatric: no depression, no anxiety    Objective:    BP 104/69   Pulse 67   Ht 5\' 10"  (1.778 m)   Wt 226 lb 6.4 oz (102.7 kg)   BMI 32.49 kg/m   Wt Readings from Last 3 Encounters:  03/08/20 226 lb 6.4 oz (102.7 kg)  11/07/19 227 lb 9.6 oz (103.2 kg)  09/09/19 219 lb (99.3 kg)      Physical Exam- Limited  Constitutional:  Body mass index is 32.49 kg/m. , not in acute distress, normal state of mind Eyes:  EOMI, no exophthalmos Neck: Supple Thyroid: No gross goiter Respiratory: Adequate breathing efforts Musculoskeletal: no gross deformities, strength intact in all four extremities, no gross restriction of joint movements Skin:  no rashes, no hyperemia Neurological: no tremor with outstretched hands,     Results for orders placed or performed in visit on 03/08/20  HgB A1c  Result Value Ref Range   Hemoglobin A1C 7.0 (A) 4.0 - 5.6 %   HbA1c POC (<> result, manual entry)     HbA1c, POC (prediabetic range)     HbA1c, POC (controlled diabetic range)     Diabetic Labs (most recent): Lab Results   Component Value Date   HGBA1C 7.0 (A) 03/08/2020   HGBA1C 6.9 (A) 11/07/2019   HGBA1C 8.0 (H) 07/02/2019   Lipid Panel     Component Value Date/Time   CHOL 116 10/31/2019 0737   TRIG 164 (H) 10/31/2019 0737   HDL 28 (L) 10/31/2019 0737   CHOLHDL 4.1 10/31/2019 0737   VLDL 12 01/16/2017 0940   LDLCALC 64 10/31/2019 0737      Assessment & Plan:   1. diabetes mellitus type 2 without complications, unspecified long term insulin use status ([place]) Mr. Mcateer reports significantly improved glycemic profile after he was put on adjusted medications.  -He returns with controlled glycemic profile, both fasting and postprandial.  He is point-of-care A1c 7%, overall improving from 8%.      Recent labs reviewed, showing mild renal insufficiency. - Patient remains at a high risk for more acute and chronic complications of diabetes which include CAD, CVA, CKD, retinopathy, and neuropathy. These are all discussed in detail with the patient.  - I have re-counseled the patient on diet management by adopting a carbohydrate restricted / protein rich  Diet. - He is advised to stick to a routine mealtimes to eat 3 meals  a day and avoid unnecessary snacks ( to snack only to correct hypoglycemia).  - he  admits there is a room for improvement in his diet and drink choices. -  Suggestion is made for him to avoid simple carbohydrates  from his diet including Cakes, Sweet Desserts / Pastries, Ice Cream, Soda (diet and regular), Sweet Tea, Candies, Chips, Cookies, Sweet Pastries,  Store Bought Juices, Alcohol in Excess of  1-2 drinks a day, Artificial Sweeteners, Coffee Creamer, and "Sugar-free" Products. This will help patient to have stable blood glucose profile and potentially avoid unintended weight gain.  - I have approached patient with the following individualized plan to manage diabetes and patient agrees.   -Based on his presentation, he will not require prandial insulin for now.    -He is  advised to continue Lantus 50 units daily at breakfast, continue Victoza 1.8 mg subcutaneously daily.   -He will continue to monitor blood glucose 2 times a day-daily before breakfast and at bedtime. -He is warned not to take insulin without proper monitoring per orders. -Patient is encouraged to call clinic for blood glucose levels less than 70 or above 300 mg /dl. - He will not require prandial insulin for now.  -He is benefiting from glipizide therapy.  He is advised to continue glipizide 5 mg XL p.o. daily at breakfast. -His renal function is stable since last visit, takes only Tylenol when he needs it at this time.  If renal function does not improve by next visit, he will be considered for  nephrology referral.  - He is advised to avoid large doses of NSAIDs, and advised to maintain adequate hydration.    - Patient specific target  for A1c; LDL, HDL, Triglycerides, and  Waist Circumference were discussed in detail.  2) BP/HTN: His blood pressure is controlled to target.    He is advised to continue his current blood pressure medications including benazepril 20 mg p.o. daily.    3) Lipids/HPL: Recent lipid panel showed controlled LDL at 76.  He will continue to benefit from statin therapy, advised to continue lovastatin 20 mg p.o. nightly.  Side effects and precautions discussed with him.      4)  Weight/Diet: His BMI 32.4-is a candidate for modest weight loss.    CDE consult in progress, exercise, and carbohydrates information provided.  5) Chronic Care/Health Maintenance:  -Patient  on ACEI and Statin medications and encouraged to continue to follow up with Ophthalmology, Podiatrist at least yearly or according to recommendations, and advised to stay away from smoking. I have recommended yearly flu vaccine and pneumonia vaccination at least every 5 years; moderate intensity exercise for up to 150 minutes weekly; and  sleep for at least 7 hours a day.  I advised patient to maintain  close follow up with his PCP for primary care needs.  - Time spent on this patient care encounter:  35 min, of which > 50% was spent in  counseling and the rest reviewing his blood glucose logs , discussing his hypoglycemia and hyperglycemia episodes, reviewing his current and  previous labs / studies  ( including abstraction from other facilities) and medications  doses and developing a  long term treatment plan and documenting his care.   Please refer to Patient Instructions for Blood Glucose Monitoring and Insulin/Medications Dosing Guide"  in media tab for additional information. Please  also refer to " Patient Self Inventory" in the Media  tab for reviewed elements of pertinent patient history.  Travis Herrera participated in the discussions, expressed understanding, and voiced agreement with the above plans.  All questions were answered to his satisfaction. he is encouraged to contact clinic should he have any questions or concerns prior to his return visit.    Follow up plan: Return in about 4 months (around 07/09/2020) for F/U with Pre-visit Labs, NV A1c in Office, NV Office Urine MA.  Glade Lloyd, MD Phone: [phone]  Fax: [phone]  This note was partially dictated with voice recognition software. Similar sounding words can be transcribed inadequately or may not  be corrected upon review.  03/08/2020, 7:13 PM

## 2020-03-08 NOTE — Patient Instructions (Signed)

## 2020-04-01 ENCOUNTER — Other Ambulatory Visit: Payer: Self-pay | Admitting: Family Medicine

## 2020-04-12 ENCOUNTER — Other Ambulatory Visit: Payer: Self-pay | Admitting: Family Medicine

## 2020-04-28 ENCOUNTER — Other Ambulatory Visit: Payer: Self-pay | Admitting: Family Medicine

## 2020-04-28 ENCOUNTER — Other Ambulatory Visit: Payer: Self-pay | Admitting: "Endocrinology

## 2020-05-06 ENCOUNTER — Other Ambulatory Visit: Payer: Self-pay | Admitting: Family Medicine

## 2020-05-19 ENCOUNTER — Encounter: Payer: Self-pay | Admitting: Family Medicine

## 2020-05-19 ENCOUNTER — Ambulatory Visit (INDEPENDENT_AMBULATORY_CARE_PROVIDER_SITE_OTHER): Payer: PPO | Admitting: Family Medicine

## 2020-05-19 ENCOUNTER — Other Ambulatory Visit: Payer: Self-pay

## 2020-05-19 VITALS — BP 120/82 | HR 78 | Resp 16 | Ht 70.0 in | Wt 232.0 lb

## 2020-05-19 DIAGNOSIS — E1136 Type 2 diabetes mellitus with diabetic cataract: Secondary | ICD-10-CM

## 2020-05-19 DIAGNOSIS — Z794 Long term (current) use of insulin: Secondary | ICD-10-CM

## 2020-05-19 DIAGNOSIS — Z125 Encounter for screening for malignant neoplasm of prostate: Secondary | ICD-10-CM

## 2020-05-19 DIAGNOSIS — E782 Mixed hyperlipidemia: Secondary | ICD-10-CM

## 2020-05-19 DIAGNOSIS — N182 Chronic kidney disease, stage 2 (mild): Secondary | ICD-10-CM

## 2020-05-19 DIAGNOSIS — E1122 Type 2 diabetes mellitus with diabetic chronic kidney disease: Secondary | ICD-10-CM

## 2020-05-19 DIAGNOSIS — Z0000 Encounter for general adult medical examination without abnormal findings: Secondary | ICD-10-CM

## 2020-05-19 MED ORDER — ROSUVASTATIN CALCIUM 10 MG PO TABS: 10.0000 mg | ORAL_TABLET | Freq: Every day | ORAL | 3 refills | Status: DC

## 2020-05-19 MED ORDER — TADALAFIL 20 MG PO TABS
20.0000 mg | ORAL_TABLET | Freq: Every day | ORAL | 3 refills | Status: DC | PRN
Start: 2020-05-19 — End: 2022-03-23

## 2020-05-19 NOTE — Assessment & Plan Note (Signed)

## 2020-05-19 NOTE — Assessment & Plan Note (Signed)
Controlled, no change in medication Travis Herrera is reminded of the importance of commitment to daily physical activity for 30 minutes or more, as able and the need to limit carbohydrate intake to 30 to 60 grams per meal to help with blood sugar control.   The need to take medication as prescribed, test blood sugar as directed, and to call between visits if there is a concern that blood sugar is uncontrolled is also discussed.   Travis Herrera is reminded of the importance of daily foot exam, annual eye examination, and good blood sugar, blood pressure and cholesterol control.  Diabetic Labs Latest Ref Rng & Units 03/08/2020 11/07/2019 10/31/2019 07/02/2019 12/06/2018  HbA1c 4.0 - 5.6 % 7.0(A) 6.9(A) - 8.0(H) 7.5(H)  Microalbumin mg/dL - - 1.0 - 1.3  Micro/Creat Ratio <30 mcg/mg creat - - 9 - 10  Chol <200 mg/dL - - 116 119 -  HDL > OR = 40 mg/dL - - 28(L) 29(L) -  Calc LDL mg/dL (calc) - - 64 66 -  Triglycerides <150 mg/dL - - 164(H) 159(H) -  Creatinine 0.70 - 1.25 mg/dL - - 1.74(H) 1.81(H) 1.81(H)   BP/Weight 05/19/2020 03/08/2020 11/07/2019 09/09/2019 06/26/2019 04/18/8888 10/21/9448  Systolic BP 388 828 003 491 791 505 697  Diastolic BP 82 69 72 80 70 70 83  Wt. (Lbs) 232 226.4 227.6 219 229 229 220  BMI 33.29 32.49 32.66 31.42 32.86 32.86 31.57   Foot/eye exam completion dates Latest Ref Rng & Units 05/19/2020 03/12/2019  Eye Exam No Retinopathy - -  Foot Form Completion - Done Done

## 2020-05-19 NOTE — Patient Instructions (Addendum)
F/u with MD in 6 months call if you need me sooner.  Nurse pls send to Dr Radford Pax for eye exam  Fasting lipid CBC and PSA to be drawn September 24 on the same day you draw blood for Dr. Dorris Fetch.  Change in your cholesterol medicine from lovastatin 20 mg daily to rosuvastatin, Crestor 10 mg daily.  Congratulations on excellent blood pressure and blood sugar no change in medication continue the good health habits.  Change from Viagra to Cialis for erectile dysfunction.  Please schedule and get your eye exam done.  Best for the rest of this year into the new year.  Be careful not to fall.   It is important that you exercise regularly at least 30 minutes 5 times a week. If you develop chest pain, have severe difficulty breathing, or feel very tired, stop exercising immediately and seek medical attention  Think about what you will eat, plan ahead. Choose " clean, green, fresh or frozen" over canned, processed or packaged foods which are more sugary, salty and fatty. 70 to 75% of food eaten should be vegetables and fruit. Three meals at set times with snacks allowed between meals, but they must be fruit or vegetables. Aim to eat over a 12 hour period , example 7 am to 7 pm, and STOP after  your last meal of the day. Drink water,generally about 64 ounces per day, no other drink is as healthy. Fruit juice is best enjoyed in a healthy way, by EATING the fruit. Thanks for choosing [facility], we consider it a privelige to serve you.

## 2020-05-19 NOTE — Progress Notes (Signed)
Travis Herrera     MRN: [id number]      DOB: October 23, 1955   HPI: Patient is in for annual physical exam. No other health concerns are expressed or addressed at the visit. Recent labs, if available are reviewed. Immunization is reviewed , and  updated if needed.    PE; BP 120/82   Pulse 78   Resp 16   Ht 5\' 10"  (1.778 m)   Wt 232 lb (105.2 kg)   SpO2 98%   BMI 33.29 kg/m   Pleasant male, alert and oriented x 3, in no cardio-pulmonary distress. Afebrile. HEENT No facial trauma or asymetry. Sinuses non tender. EOMI External ears normal,  Neck: supple, no adenopathy,JVD or thyromegaly.No bruits.  Chest: Clear to ascultation bilaterally.No crackles or wheezes. Non tender to palpation  Cardiovascular system; Heart sounds normal,  S1 and  S2 ,no S3.  No murmur, or thrill. Apical beat not displaced Peripheral pulses normal.  Abdomen: Soft, non tender, No guarding, tenderness or rebound.    Musculoskeletal exam: Decreased  ROM of , hips , and knees.  deformity ,swelling and  crepitus noted.of left knee No muscle wasting or atrophy.   Neurologic: Cranial nerves 2 to 12 intact. Power, tone ,sensation and reflexes normal throughout. t. No tremor.  Skin: Intact, no ulceration, erythema , scaling or rash noted. Pigmentation normal throughout  Psych; Normal mood and affect. Judgement and concentration normal   Assessment & Plan:  Annual physical exam Annual exam as documented. Counseling done  re healthy lifestyle involving commitment to 150 minutes exercise per week, heart healthy diet, and attaining healthy weight.The importance of adequate sleep also discussed. Regular seat belt use and home safety, is also discussed. Changes in health habits are decided on by the patient with goals and time frames  set for achieving them. Immunization and cancer screening needs are specifically addressed at this visit.   Type 2 diabetes mellitus with stage 2 chronic kidney  disease, with long-term current use of insulin ([facility]) Controlled, no change in medication [name] is reminded of the importance of commitment to daily physical activity for 30 minutes or more, as able and the need to limit carbohydrate intake to 30 to 60 grams per meal to help with blood sugar control.   The need to take medication as prescribed, test blood sugar as directed, and to call between visits if there is a concern that blood sugar is uncontrolled is also discussed.   [name] is reminded of the importance of daily foot exam, annual eye examination, and good blood sugar, blood pressure and cholesterol control.  Diabetic Labs Latest Ref Rng & Units 03/08/2020 11/07/2019 10/31/2019 07/02/2019 12/06/2018  HbA1c 4.0 - 5.6 % 7.0(A) 6.9(A) - 8.0(H) 7.5(H)  Microalbumin mg/dL - - 1.0 - 1.3  Micro/Creat Ratio <30 mcg/mg creat - - 9 - 10  Chol <200 mg/dL - - 116 119 -  HDL > OR = 40 mg/dL - - 28(L) 29(L) -  Calc LDL mg/dL (calc) - - 64 66 -  Triglycerides <150 mg/dL - - 164(H) 159(H) -  Creatinine 0.70 - 1.25 mg/dL - - 1.74(H) 1.81(H) 1.81(H)   BP/Weight 05/19/2020 03/08/2020 11/07/2019 09/09/2019 06/26/2019 7/82/9562 10/19/863  Systolic BP 784 696 295 284 132 440 102  Diastolic BP 82 69 72 80 70 70 83  Wt. (Lbs) 232 226.4 227.6 219 229 229 220  BMI 33.29 32.49 32.66 31.42 32.86 32.86 31.57   Foot/eye exam completion dates Latest Ref Rng &  Units 05/19/2020 03/12/2019  Eye Exam No Retinopathy - -  Foot Form Completion - Done Done

## 2020-05-24 ENCOUNTER — Ambulatory Visit: Payer: PPO

## 2020-05-24 ENCOUNTER — Other Ambulatory Visit: Payer: Self-pay

## 2020-05-31 ENCOUNTER — Encounter: Payer: PPO | Admitting: Family Medicine

## 2020-06-29 ENCOUNTER — Encounter: Payer: PPO | Admitting: Family Medicine

## 2020-07-02 ENCOUNTER — Ambulatory Visit (INDEPENDENT_AMBULATORY_CARE_PROVIDER_SITE_OTHER): Payer: PPO

## 2020-07-02 ENCOUNTER — Other Ambulatory Visit: Payer: Self-pay

## 2020-07-02 DIAGNOSIS — Z23 Encounter for immunization: Secondary | ICD-10-CM | POA: Diagnosis not present

## 2020-07-09 ENCOUNTER — Other Ambulatory Visit: Payer: Self-pay | Admitting: Internal Medicine

## 2020-07-09 ENCOUNTER — Other Ambulatory Visit: Payer: PPO

## 2020-07-09 DIAGNOSIS — Z20822 Contact with and (suspected) exposure to covid-19: Secondary | ICD-10-CM

## 2020-07-10 ENCOUNTER — Telehealth: Payer: Self-pay | Admitting: *Deleted

## 2020-07-10 LAB — SARS-COV-2, NAA 2 DAY TAT

## 2020-07-10 LAB — NOVEL CORONAVIRUS, NAA: SARS-CoV-2, NAA: NOT DETECTED

## 2020-07-10 NOTE — Telephone Encounter (Signed)
Patient called [facility] again at 10:39 p.m. stating he had just received notification in his MyChart that the COVID-19 test results are now available.  Patient wanting to know the results.  Pt notified of negative COVID-19 results. Understanding verbalized.

## 2020-07-10 NOTE — Telephone Encounter (Signed)
Patient called [facility] Line to check to see if his results from the COVID-19 test, which was performed on 07/09/2020, were available.  Patient informed the results are still pending.  Patient had no further questions or concerns.

## 2020-07-13 ENCOUNTER — Ambulatory Visit: Payer: PPO | Admitting: Nurse Practitioner

## 2020-07-13 ENCOUNTER — Other Ambulatory Visit: Payer: Self-pay | Admitting: Family Medicine

## 2020-07-13 DIAGNOSIS — N182 Chronic kidney disease, stage 2 (mild): Secondary | ICD-10-CM | POA: Diagnosis not present

## 2020-07-13 DIAGNOSIS — Z794 Long term (current) use of insulin: Secondary | ICD-10-CM | POA: Diagnosis not present

## 2020-07-13 DIAGNOSIS — E1122 Type 2 diabetes mellitus with diabetic chronic kidney disease: Secondary | ICD-10-CM | POA: Diagnosis not present

## 2020-07-13 DIAGNOSIS — E782 Mixed hyperlipidemia: Secondary | ICD-10-CM | POA: Diagnosis not present

## 2020-07-13 DIAGNOSIS — Z125 Encounter for screening for malignant neoplasm of prostate: Secondary | ICD-10-CM | POA: Diagnosis not present

## 2020-07-14 LAB — LIPID PANEL
Cholesterol: 86 mg/dL (ref <200)
HDL: 27 mg/dL — ABNORMAL LOW (ref >=40)
LDL Cholesterol (Calc): 36 mg/dL (calc)
Non-HDL Cholesterol (Calc): 59 mg/dL (calc) (ref <130)
Total CHOL/HDL Ratio: 3.2 (calc) (ref <5.0)
Triglycerides: 150 mg/dL — ABNORMAL HIGH (ref <150)

## 2020-07-14 LAB — CBC
HCT: 37 % — ABNORMAL LOW (ref 38.5–50.0)
Hemoglobin: 12.3 g/dL — ABNORMAL LOW (ref 13.2–17.1)
MCH: 30.8 pg (ref 27.0–33.0)
MCHC: 33.2 g/dL (ref 32.0–36.0)
MCV: 92.7 fL (ref 80.0–100.0)
MPV: 10.3 fL (ref 7.5–12.5)
Platelets: 265 10*3/uL (ref 140–400)
RBC: 3.99 10*6/uL — ABNORMAL LOW (ref 4.20–5.80)
RDW: 13.5 % (ref 11.0–15.0)
WBC: 8.4 10*3/uL (ref 3.8–10.8)

## 2020-07-14 LAB — COMPLETE METABOLIC PANEL WITH GFR
AG Ratio: 1.7 (calc) (ref 1.0–2.5)
ALT: 24 U/L (ref 9–46)
AST: 19 U/L (ref 10–35)
Albumin: 4.3 g/dL (ref 3.6–5.1)
Alkaline phosphatase (APISO): 77 U/L (ref 35–144)
BUN/Creatinine Ratio: 15 (calc) (ref 6–22)
BUN: 24 mg/dL (ref 7–25)
CO2: 26 mmol/L (ref 20–32)
Calcium: 9 mg/dL (ref 8.6–10.3)
Chloride: 103 mmol/L (ref 98–110)
Creat: 1.63 mg/dL — ABNORMAL HIGH (ref 0.70–1.25)
GFR, Est African American: 52 mL/min/{1.73_m2} — ABNORMAL LOW (ref >=60)
GFR, Est Non African American: 45 mL/min/{1.73_m2} — ABNORMAL LOW (ref >=60)
Globulin: 2.6 g/dL (calc) (ref 1.9–3.7)
Glucose, Bld: 179 mg/dL — ABNORMAL HIGH (ref 65–99)
Potassium: 4.4 mmol/L (ref 3.5–5.3)
Sodium: 137 mmol/L (ref 135–146)
Total Bilirubin: 0.3 mg/dL (ref 0.2–1.2)
Total Protein: 6.9 g/dL (ref 6.1–8.1)

## 2020-07-14 LAB — T4, FREE: Free T4: 1.2 ng/dL (ref 0.8–1.8)

## 2020-07-14 LAB — TSH: TSH: 1.64 mIU/L (ref 0.40–4.50)

## 2020-07-14 LAB — PSA: PSA: 1.39 ng/mL (ref <=4.0)

## 2020-07-22 ENCOUNTER — Ambulatory Visit (INDEPENDENT_AMBULATORY_CARE_PROVIDER_SITE_OTHER): Payer: PPO | Admitting: Nurse Practitioner

## 2020-07-22 ENCOUNTER — Other Ambulatory Visit: Payer: Self-pay

## 2020-07-22 ENCOUNTER — Encounter: Payer: Self-pay | Admitting: Nurse Practitioner

## 2020-07-22 VITALS — BP 108/68 | HR 68 | Ht 70.0 in | Wt 229.6 lb

## 2020-07-22 DIAGNOSIS — Z794 Long term (current) use of insulin: Secondary | ICD-10-CM | POA: Diagnosis not present

## 2020-07-22 DIAGNOSIS — I10 Essential (primary) hypertension: Secondary | ICD-10-CM

## 2020-07-22 DIAGNOSIS — E1122 Type 2 diabetes mellitus with diabetic chronic kidney disease: Secondary | ICD-10-CM | POA: Diagnosis not present

## 2020-07-22 DIAGNOSIS — N1831 Chronic kidney disease, stage 3a: Secondary | ICD-10-CM | POA: Diagnosis not present

## 2020-07-22 DIAGNOSIS — E782 Mixed hyperlipidemia: Secondary | ICD-10-CM | POA: Diagnosis not present

## 2020-07-22 DIAGNOSIS — N182 Chronic kidney disease, stage 2 (mild): Secondary | ICD-10-CM | POA: Diagnosis not present

## 2020-07-22 LAB — POCT GLYCOSYLATED HEMOGLOBIN (HGB A1C): Hemoglobin A1C: 7 % — AB (ref 4.0–5.6)

## 2020-07-22 NOTE — Progress Notes (Signed)
07/22/2020  Endocrinology follow-up note   Subjective:    Patient ID: Travis Herrera, male    DOB: [birth year],    Past Medical History:  Diagnosis Date  . Diabetes mellitus without complication ([place]) 7106   insulin started in 2012  . Hyperlipemia 2013  . Hypertension 2010   Past Surgical History:  Procedure Laterality Date  . CATARACT EXTRACTION W/PHACO  07/29/2012   Procedure: CATARACT EXTRACTION PHACO AND INTRAOCULAR LENS PLACEMENT (IOC);  Surgeon: Tonny Branch, MD;  Location: [facility];  Service: Ophthalmology;  Laterality: Left;  CDE=1.66  . CHOLECYSTECTOMY  2007   [facility]  . EYE SURGERY Left 2013   cataract  . QUADRICEPS TENDON REPAIR Right 03/19/2018   Procedure: REPAIR QUADRICEP TENDON;  Surgeon: Carole Civil, MD;  Location: [facility];  Service: Orthopedics;  Laterality: Right;   Social History   Socioeconomic History  . Marital status: Married    Spouse name: Not on file  . Number of children: 4  . Years of education: Not on file  . Highest education level: Not on file  Occupational History  . Not on file  Tobacco Use  . Smoking status: Never Smoker  . Smokeless tobacco: Never Used  Vaping Use  . Vaping Use: Never used  Substance and Sexual Activity  . Alcohol use: No  . Drug use: No  . Sexual activity: Yes    Birth control/protection: None  Other Topics Concern  . Not on file  Social History Narrative  . Not on file   Social Determinants of Health   Financial Resource Strain:   . Difficulty of Paying Living Expenses: Not on file  Food Insecurity:   . Worried About Charity fundraiser in the Last Year: Not on file  . Ran Out of Food in the Last Year: Not on file  Transportation Needs:   . Lack of Transportation (Medical): Not on file  . Lack of Transportation (Non-Medical): Not on file  Physical Activity:   . Days of Exercise per Week: Not on file  . Minutes of Exercise per Session: Not on file  Stress:   . Feeling of Stress : Not on file   Social Connections:   . Frequency of Communication with Friends and Family: Not on file  . Frequency of Social Gatherings with Friends and Family: Not on file  . Attends Religious Services: Not on file  . Active Member of Clubs or Organizations: Not on file  . Attends Archivist Meetings: Not on file  . Marital Status: Not on file   Outpatient Encounter Medications as of 07/22/2020  Medication Sig  . amLODipine (NORVASC) 10 MG tablet Take 1 tablet (10 mg total) by mouth daily.  Marland Kitchen aspirin EC 81 MG tablet Take 81 mg by mouth daily.  . benazepril (LOTENSIN) 20 MG tablet Take 1 tablet by mouth once daily  . glipiZIDE (GLUCOTROL XL) 5 MG 24 hr tablet Take 1 tablet by mouth once daily with breakfast  . Insulin Glargine (LANTUS SOLOSTAR) 100 UNIT/ML Solostar Pen Inject 50 Units into the skin daily with breakfast.  . Insulin Pen Needle 31G X 8 MM MISC 1 each by Does not apply route 2 (two) times daily.  . Lancets (FREESTYLE) lancets Use as instructed bid  . liraglutide (VICTOZA) 18 MG/3ML SOPN Inject 0.3 mLs (1.8 mg total) into the skin every morning. INJECT 1.8 MG INTO THE SKIN DAILY AS DIRECTED  . montelukast (SINGULAIR) 10 MG tablet Take 1  tablet (10 mg total) by mouth at bedtime.  Glory Rosebush DELICA LANCETS 83M MISC Three times daily testing dx e11.65  . ONETOUCH VERIO test strip USE 1 STRIP TO CHECK GLUCOSE THREE TIMES DAILY AS DIRECTED  . rosuvastatin (CRESTOR) 10 MG tablet Take 1 tablet (10 mg total) by mouth daily.  . Saw Palmetto 1000 MG CAPS Take 1,000 mg by mouth daily.  . tadalafil (CIALIS) 20 MG tablet Take 1 tablet (20 mg total) by mouth daily as needed for erectile dysfunction.  . triamterene-hydrochlorothiazide (MAXZIDE-25) 37.5-25 MG tablet Take 1 tablet by mouth once daily   No facility-administered encounter medications on file as of 07/22/2020.   ALLERGIES: No Known Allergies VACCINATION STATUS: Immunization History  Administered Date(s) Administered  . Influenza  Split 07/17/2014  . Influenza,inj,Quad PF,6+ Mos 08/16/2015, 07/26/2016, 08/02/2017, 07/19/2018, 06/16/2019, 07/02/2020  . Moderna SARS-COVID-2 Vaccination 12/27/2019, 01/28/2020  . Pneumococcal Polysaccharide-23 09/15/2019    Diabetes He presents for his follow-up diabetic visit. He has type 2 diabetes mellitus. Onset time: He was diagnosed at approximate age of 45 years. His disease course has been stable. There are no hypoglycemic associated symptoms. Pertinent negatives for hypoglycemia include no confusion, headaches, pallor or seizures. Pertinent negatives for diabetes include no chest pain, no fatigue, no polydipsia, no polyphagia, no polyuria and no weakness. There are no hypoglycemic complications. Symptoms are stable. Diabetic complications include nephropathy. Risk factors for coronary artery disease include diabetes mellitus, dyslipidemia, hypertension, family history, male sex, sedentary lifestyle and obesity. Current diabetic treatment includes insulin injections and oral agent (monotherapy). He is compliant with treatment most of the time. His weight is fluctuating minimally. He is following a generally unhealthy diet. When asked about meal planning, he reported none. He has not had a previous visit with a dietitian. He rarely participates in exercise. His home blood glucose trend is fluctuating minimally. His breakfast blood glucose range is generally 90-110 mg/dl. His overall blood glucose range is 130-140 mg/dl. (He presents today with his meter and logs showing at target fasting and postprandial glycemic profile.  His POCT A1C today is 7.0%, unchanged from last visit.  There are no documented or reported episodes of hypoglycemia.) An ACE inhibitor/angiotensin II receptor blocker is being taken. He does not see a podiatrist.Eye exam is current.  Hypertension This is a chronic problem. The current episode started more than 1 year ago. The problem has been gradually improving since onset.  The problem is controlled. Pertinent negatives include no chest pain, headaches, neck pain, palpitations or shortness of breath. There are no associated agents to hypertension. Risk factors for coronary artery disease include dyslipidemia, diabetes mellitus, male gender, obesity and sedentary lifestyle. Past treatments include ACE inhibitors. The current treatment provides moderate improvement. There are no compliance problems.  Identifiable causes of hypertension include chronic renal disease.  Hyperlipidemia This is a chronic problem. The current episode started more than 1 year ago. The problem is controlled. Recent lipid tests were reviewed and are normal. Exacerbating diseases include chronic renal disease, diabetes and obesity. Factors aggravating his hyperlipidemia include fatty foods. Pertinent negatives include no chest pain, myalgias or shortness of breath. Current antihyperlipidemic treatment includes statins. The current treatment provides moderate improvement of lipids. Compliance problems include adherence to diet and adherence to exercise.  Risk factors for coronary artery disease include dyslipidemia, diabetes mellitus, hypertension, male sex, a sedentary lifestyle and obesity.    Review of systems  Constitutional: + Minimally fluctuating body weight,  current Body mass index is 32.94 kg/m. ,  no fatigue, no subjective hyperthermia, no subjective hypothermia Eyes: no blurry vision, no xerophthalmia ENT: no sore throat, no nodules palpated in throat, no dysphagia/odynophagia, no hoarseness Cardiovascular: no chest pain, no shortness of breath, no palpitations, no leg swelling Respiratory: no cough, no shortness of breath Gastrointestinal: no nausea/vomiting/diarrhea Musculoskeletal: no muscle/joint aches Skin: no rashes, no hyperemia Neurological: no tremors, no numbness, no tingling, no dizziness Psychiatric: no depression, no anxiety    Objective:    BP 108/68 (BP Location:  Right Arm)   Pulse 68   Ht 5\' 10"  (1.778 m)   Wt 229 lb 9.6 oz (104.1 kg)   BMI 32.94 kg/m   Wt Readings from Last 3 Encounters:  07/22/20 229 lb 9.6 oz (104.1 kg)  05/19/20 232 lb (105.2 kg)  03/08/20 226 lb 6.4 oz (102.7 kg)    BP Readings from Last 3 Encounters:  07/22/20 108/68  05/19/20 120/82  03/08/20 104/69     Physical Exam- Limited  Constitutional:  Body mass index is 32.94 kg/m. , not in acute distress, normal state of mind Eyes:  EOMI, no exophthalmos Neck: Supple Thyroid: No gross goiter Cardiovascular: RRR, no murmers, rubs, or gallops, no edema Respiratory: Adequate breathing efforts, no crackles, rales, rhonchi, or wheezing Musculoskeletal: no gross deformities, strength intact in all four extremities, no gross restriction of joint movements Skin:  no rashes, no hyperemia Neurological: no tremor with outstretched hands  POCT ABI Results 07/22/20   Right ABI:  1.39      Left ABI:  1.40  Right leg systolic / diastolic: 161/09 mmHg Left leg systolic / diastolic: 604/54 mmHg  Arm systolic / diastolic: 098/11 mmHG  Detailed report will be scanned into patient chart.  Results for orders placed or performed in visit on 07/22/20  HgB A1c  Result Value Ref Range   Hemoglobin A1C 7.0 (A) 4.0 - 5.6 %   HbA1c POC (<> result, manual entry)     HbA1c, POC (prediabetic range)     HbA1c, POC (controlled diabetic range)     Diabetic Labs (most recent): Lab Results  Component Value Date   HGBA1C 7.0 (A) 07/22/2020   HGBA1C 7.0 (A) 03/08/2020   HGBA1C 6.9 (A) 11/07/2019   Lipid Panel     Component Value Date/Time   CHOL 86 07/13/2020 0840   TRIG 150 (H) 07/13/2020 0840   HDL 27 (L) 07/13/2020 0840   CHOLHDL 3.2 07/13/2020 0840   VLDL 12 01/16/2017 0940   LDLCALC 36 07/13/2020 0840      Assessment & Plan:   1. Diabetes mellitus type 2 without complications, with long term insulin use ([place])  He presents today with his meter and logs showing at  target fasting and postprandial glycemic profile.  His POCT A1C today is 7.0%, unchanged from last visit.  There are no documented or reported episodes of hypoglycemia.  He reports he has been working out more and eating better.    Recent labs reviewed, showing mild renal insufficiency.  - Patient remains at a high risk for more acute and chronic complications of diabetes which include CAD, CVA, CKD, retinopathy, and neuropathy. These are all discussed in detail with the patient.  - Nutritional counseling repeated at each appointment due to patients tendency to fall back in to old habits.  - The patient admits there is a room for improvement in their diet and drink choices. -  Suggestion is made for the patient to avoid simple carbohydrates from their diet including Cakes, Sweet  Desserts / Pastries, Ice Cream, Soda (diet and regular), Sweet Tea, Candies, Chips, Cookies, Sweet Pastries,  Store Bought Juices, Alcohol in Excess of  1-2 drinks a day, Artificial Sweeteners, Coffee Creamer, and "Sugar-free" Products. This will help patient to have stable blood glucose profile and potentially avoid unintended weight gain.   - I encouraged the patient to switch to  unprocessed or minimally processed complex starch and increased protein intake (animal or plant source), fruits, and vegetables.   - Patient is advised to stick to a routine mealtimes to eat 3 meals  a day and avoid unnecessary snacks ( to snack only to correct hypoglycemia).  - I have approached patient with the following individualized plan to manage diabetes and patient agrees.  Given his stable glycemic profile, he is advised to continue with same current medication regimen as follows: -Lantus 50 units SQ daily at breakfast -Victoza 1.8 SQ daily -Glipizide 5 mg XL daily with breakfast  -He is advised to continue monitoring blood glucose at least twice daily, before breakfast and before bed, and call the clinic if he gets readings less  than 70 or greater than 200 for 3 tests in a row.  -His kidney function has stabilized.  He is advised to maintain adequate hydration and avoid nephrotoxic medications.  - Patient specific target  for A1c; LDL, HDL, Triglycerides, and  Waist Circumference were discussed in detail.  2) BP/HTN: His blood pressure is controlled to target.  He is advised to continue Amlodipine 10 mg po daily, Benazepril 20 mg po daily, and Triamterene-HCT 37.5-25 mg po daily.  3) Lipids/HPL: Most recent lipid panel from 07/13/20 shows controlled LDL at 36 and elevated triglycerides of 150.  He is advised to continue Crestor 10 mg po daily at bedtime.  Side effects and precautions discussed with him.  He is advised to avoid fried foods and butter.   4)  Weight/Diet:  His Body mass index is 32.94 kg/m.-is a candidate for modest weight loss.  CDE consult in progress, exercise, and carbohydrates information provided.  5) Chronic Care/Health Maintenance: -Patient on ACEI and Statin medications and encouraged to continue to follow up with Ophthalmology, Podiatrist at least yearly or according to recommendations, and advised to stay away from smoking. I have recommended yearly flu vaccine and pneumonia vaccination at least every 5 years; moderate intensity exercise for up to 150 minutes weekly; and  sleep for at least 7 hours a day.  I advised patient to maintain close follow up with his PCP for primary care needs.  - Time spent on this patient care encounter:  35 min, of which > 50% was spent in  counseling and the rest reviewing his blood glucose logs , discussing his hypoglycemia and hyperglycemia episodes, reviewing his current and  previous labs / studies  ( including abstraction from other facilities) and medications  doses and developing a  long term treatment plan and documenting his care.   Please refer to Patient Instructions for Blood Glucose Monitoring and Insulin/Medications Dosing Guide"  in media tab for  additional information. Please  also refer to " Patient Self Inventory" in the Media  tab for reviewed elements of pertinent patient history.  Travis Herrera participated in the discussions, expressed understanding, and voiced agreement with the above plans.  All questions were answered to his satisfaction. he is encouraged to contact clinic should he have any questions or concerns prior to his return visit.    Follow up plan: Return in about 6  months (around 01/20/2021) for Diabetes follow up, Previsit labs, Virtual visit ok.  Rayetta Pigg, [facility] [address] [place], [place] [address] Phone: [phone] Fax: [phone]  07/22/2020, 8:35 AM

## 2020-07-22 NOTE — Patient Instructions (Signed)

## 2020-07-26 ENCOUNTER — Other Ambulatory Visit: Payer: Self-pay | Admitting: Family Medicine

## 2020-07-26 ENCOUNTER — Ambulatory Visit (INDEPENDENT_AMBULATORY_CARE_PROVIDER_SITE_OTHER): Payer: PPO

## 2020-07-26 ENCOUNTER — Other Ambulatory Visit: Payer: Self-pay

## 2020-07-26 VITALS — BP 108/68 | Ht 70.0 in | Wt 229.0 lb

## 2020-07-26 DIAGNOSIS — Z0000 Encounter for general adult medical examination without abnormal findings: Secondary | ICD-10-CM

## 2020-07-26 NOTE — Progress Notes (Signed)
Subjective:   Travis Herrera is a 64 y.o. male who presents for Medicare Annual/Subsequent preventive examination.  Review of Systems    Cardiac Risk Factors include: diabetes mellitus;advanced age (>62men, >7 women);obesity (BMI >30kg/m2);family history of premature cardiovascular disease;hypertension     Objective:    Today's Vitals   07/26/20 1305 07/26/20 1314  BP: 108/68   Weight: 229 lb (103.9 kg)   Height: 5\' 10"  (1.778 m)   PainSc: 7  7   PainLoc: Knee    Body mass index is 32.86 kg/m.  Advanced Directives 07/26/2020 06/24/2018 06/24/2018 06/19/2018 03/19/2018 07/25/2012  Does Patient Have a Medical Advance Directive? No No No No No Patient does not have advance directive;Patient would not like information  Would patient like information on creating a medical advance directive? No - Patient declined Yes (ED - Information included in AVS) Yes (ED - Information included in AVS) No - Patient declined No - Patient declined -  Pre-existing out of facility DNR order (yellow form or pink MOST form) - - - - - No    Current Medications (verified) Outpatient Encounter Medications as of 07/26/2020  Medication Sig  . amLODipine (NORVASC) 10 MG tablet Take 1 tablet (10 mg total) by mouth daily.  Marland Kitchen aspirin EC 81 MG tablet Take 81 mg by mouth daily.  . benazepril (LOTENSIN) 20 MG tablet Take 1 tablet by mouth once daily  . glipiZIDE (GLUCOTROL XL) 5 MG 24 hr tablet Take 1 tablet by mouth once daily with breakfast  . Insulin Glargine (LANTUS SOLOSTAR) 100 UNIT/ML Solostar Pen Inject 50 Units into the skin daily with breakfast.  . Insulin Pen Needle 31G X 8 MM MISC 1 each by Does not apply route 2 (two) times daily.  . Lancets (FREESTYLE) lancets Use as instructed bid  . liraglutide (VICTOZA) 18 MG/3ML SOPN Inject 0.3 mLs (1.8 mg total) into the skin every morning. INJECT 1.8 MG INTO THE SKIN DAILY AS DIRECTED  . montelukast (SINGULAIR) 10 MG tablet Take 1 tablet (10 mg total) by mouth  at bedtime.  Glory Rosebush DELICA LANCETS 93X MISC Three times daily testing dx e11.65  . ONETOUCH VERIO test strip USE 1 STRIP TO CHECK GLUCOSE THREE TIMES DAILY AS DIRECTED  . rosuvastatin (CRESTOR) 10 MG tablet Take 1 tablet (10 mg total) by mouth daily.  . Saw Palmetto 1000 MG CAPS Take 1,000 mg by mouth daily.  . tadalafil (CIALIS) 20 MG tablet Take 1 tablet (20 mg total) by mouth daily as needed for erectile dysfunction.  . triamterene-hydrochlorothiazide (MAXZIDE-25) 37.5-25 MG tablet Take 1 tablet by mouth once daily   No facility-administered encounter medications on file as of 07/26/2020.    Allergies (verified) Patient has no known allergies.   History: Past Medical History:  Diagnosis Date  . Diabetes mellitus without complication ([place]) 9024   insulin started in 2012  . Hyperlipemia 2013  . Hypertension 2010   Past Surgical History:  Procedure Laterality Date  . CATARACT EXTRACTION W/PHACO  07/29/2012   Procedure: CATARACT EXTRACTION PHACO AND INTRAOCULAR LENS PLACEMENT (IOC);  Surgeon: Tonny Branch, MD;  Location: [facility];  Service: Ophthalmology;  Laterality: Left;  CDE=1.66  . CHOLECYSTECTOMY  2007   [facility]  . EYE SURGERY Left 2013   cataract  . QUADRICEPS TENDON REPAIR Right 03/19/2018   Procedure: REPAIR QUADRICEP TENDON;  Surgeon: Carole Civil, MD;  Location: [facility];  Service: Orthopedics;  Laterality: Right;   Family History  Problem Relation  Age of Onset  . Cancer Sister   . Early death Father        car accident   . Cancer Brother   . Hypertension Brother   . Diabetes Brother   . Hypertension Brother    Social History   Socioeconomic History  . Marital status: Married    Spouse name: Not on file  . Number of children: 4  . Years of education: Not on file  . Highest education level: Not on file  Occupational History  . Not on file  Tobacco Use  . Smoking status: Never Smoker  . Smokeless tobacco: Never Used  Vaping Use  . Vaping Use:  Never used  Substance and Sexual Activity  . Alcohol use: No  . Drug use: No  . Sexual activity: Yes    Birth control/protection: None  Other Topics Concern  . Not on file  Social History Narrative  . Not on file   Social Determinants of Health   Financial Resource Strain: Low Risk   . Difficulty of Paying Living Expenses: Not hard at all  Food Insecurity: No Food Insecurity  . Worried About Charity fundraiser in the Last Year: Never true  . Ran Out of Food in the Last Year: Never true  Transportation Needs: No Transportation Needs  . Lack of Transportation (Medical): No  . Lack of Transportation (Non-Medical): No  Physical Activity: Sufficiently Active  . Days of Exercise per Week: 5 days  . Minutes of Exercise per Session: 30 min  Stress: No Stress Concern Present  . Feeling of Stress : Not at all  Social Connections: Socially Integrated  . Frequency of Communication with Friends and Family: More than three times a week  . Frequency of Social Gatherings with Friends and Family: Once a week  . Attends Religious Services: More than 4 times per year  . Active Member of Clubs or Organizations: Yes  . Attends Archivist Meetings: 1 to 4 times per year  . Marital Status: Married    Tobacco Counseling Counseling given: Not Answered   Clinical Intake:  Pre-visit preparation completed: Yes  Pain : 0-10 Pain Score: 7  Pain Type: Chronic pain Pain Location: Knee Pain Orientation: Right Pain Descriptors / Indicators: Aching, Dull Pain Onset: More than a month ago Pain Frequency: Intermittent     BMI - recorded: 32.94 Nutritional Status: BMI > 30  Obese Nutritional Risks: None Diabetes: Yes CBG done?: No CBG resulted in Enter/ Edit results?: No Did pt. bring in CBG monitor from home?: No  How often do you need to have someone help you when you read instructions, pamphlets, or other written materials from your doctor or pharmacy?: 1 - Never What is the  last grade level you completed in school?: 1 year college  Diabetic?yes  Interpreter Needed?: No      Activities of Daily Living In your present state of health, do you have any difficulty performing the following activities: 07/26/2020  Hearing? N  Vision? N  Difficulty concentrating or making decisions? N  Walking or climbing stairs? Y  Dressing or bathing? N  Doing errands, shopping? N  Preparing Food and eating ? N  Using the Toilet? N  In the past six months, have you accidently leaked urine? N  Do you have problems with loss of bowel control? N  Managing your Medications? N  Managing your Finances? N  Housekeeping or managing your Housekeeping? N  Some recent data might be  hidden    Patient Care Team: Fayrene Helper, MD as PCP - General (Family Medicine)  Indicate any recent Medical Services you may have received from other than [facility] providers in the past year (date may be approximate).     Assessment:   This is a routine wellness examination for Taten.  Hearing/Vision screen No exam data present  Dietary issues and exercise activities discussed: Current Exercise Habits: Structured exercise class, Type of exercise: walking;treadmill, Time (Minutes): 30, Frequency (Times/Week): 5, Weekly Exercise (Minutes/Week): 150, Intensity: Moderate, Exercise limited by: None identified  Goals    . Weight (lb) < 200 lb (90.7 kg)     Wants to lose 15 lbs and build up the strenght in his legs.      Depression Screen PHQ 2/9 Scores 07/26/2020 07/26/2020 06/26/2019 03/12/2019 08/06/2018 06/24/2018 02/20/2018  PHQ - 2 Score 0 0 0 0 0 0 0  PHQ- 9 Score - - - - 4 2 -    Fall Risk Fall Risk  07/26/2020 05/19/2020 09/09/2019 06/26/2019 03/12/2019  Falls in the past year? 0 0 0 0 1  Number falls in past yr: 0 0 0 0 0  Injury with Fall? 0 0 0 0 1  Risk for fall due to : No Fall Risks - - - History of fall(s);Orthopedic patient  Follow up Falls evaluation completed - - - -     Any stairs in or around the home? Yes  If so, are there any without handrails? no Home free of loose throw rugs in walkways, pet beds, electrical cords, etc? Yes  Adequate lighting in your home to reduce risk of falls? Yes   ASSISTIVE DEVICES UTILIZED TO PREVENT FALLS:  Life alert? No  Use of a cane, walker or w/c? Yes  Grab bars in the bathroom? Yes  Shower chair or bench in shower? No  Elevated toilet seat or a handicapped toilet? No   TIMED UP AND GO:  Was the test performed? No .  Length of time to ambulate n/a     Cognitive Function:     6CIT Screen 07/26/2020 06/26/2019 06/24/2018  What Year? 0 points 0 points 0 points  What month? 0 points 0 points 0 points  What time? 0 points 0 points 0 points  Count back from 20 0 points 0 points 0 points  Months in reverse 0 points 0 points 0 points  Repeat phrase 0 points 0 points 0 points  Total Score 0 0 0    Immunizations Immunization History  Administered Date(s) Administered  . Influenza Split 07/17/2014  . Influenza,inj,Quad PF,6+ Mos 08/16/2015, 07/26/2016, 08/02/2017, 07/19/2018, 06/16/2019, 07/02/2020  . Moderna SARS-COVID-2 Vaccination 12/27/2019, 01/28/2020  . Pneumococcal Polysaccharide-23 09/15/2019    TDAP status: Up to date Flu Vaccine status: Up to date Pneumococcal vaccine status: Up to date Covid-19 vaccine status: Completed vaccines  Qualifies for Shingles Vaccine? No   Zostavax completed No   Shingrix Completed?: No.    Education has been provided regarding the importance of this vaccine. Patient has been advised to call insurance company to determine out of pocket expense if they have not yet received this vaccine. Advised may also receive vaccine at local pharmacy or Health Dept. Verbalized acceptance and understanding.  Screening Tests Health Maintenance  Topic Date Due  . OPHTHALMOLOGY EXAM  07/10/2017  . COLONOSCOPY  03/24/2022 (Originally 02/06/2019)  . HEMOGLOBIN A1C  01/20/2021  .  TETANUS/TDAP  05/03/2021  . FOOT EXAM  05/19/2021  . INFLUENZA VACCINE  Completed  . PNEUMOCOCCAL POLYSACCHARIDE VACCINE AGE 87-64 HIGH RISK  Completed  . COVID-19 Vaccine  Completed  . Hepatitis C Screening  Completed  . HIV Screening  Completed    Health Maintenance  Health Maintenance Due  Topic Date Due  . OPHTHALMOLOGY EXAM  07/10/2017    Colorectal cancer screening: Completed cologuard 2020. Repeat every 10 years  Lung Cancer Screening: (Low Dose CT Chest recommended if Age 14-80 years, 30 pack-year currently smoking OR have quit w/in 15years.) does not qualify.   Lung Cancer Screening Referral: n/a  Additional Screening:  Hepatitis C Screening: does not qualify; Completed n/a  Vision Screening: Recommended annual ophthalmology exams for early detection of glaucoma and other disorders of the eye. Is the patient up to date with their annual eye exam?  Yes  Who is the provider or what is the name of the office in which the patient attends annual eye exams? Dr. Radford Pax in [place] If pt is not established with a provider, would they like to be referred to a provider to establish care? n/a.   Dental Screening: Recommended annual dental exams for proper oral hygiene  Community Resource Referral / Chronic Care Management: CRR required this visit?  No   CCM required this visit?  No      Plan:     I have personally reviewed and noted the following in the patient's chart:   . Medical and social history . Use of alcohol, tobacco or illicit drugs  . Current medications and supplements . Functional ability and status . Nutritional status . Physical activity . Advanced directives . List of other physicians . Hospitalizations, surgeries, and ER visits in previous 12 months . Vitals . Screenings to include cognitive, depression, and falls . Referrals and appointments  In addition, I have reviewed and discussed with patient certain preventive protocols, quality metrics, and  best practice recommendations. A written personalized care plan for preventive services as well as general preventive health recommendations were provided to patient.     Laretta Bolster, [place]   [id number]/06/[id number]   Nurse Notes:

## 2020-07-27 ENCOUNTER — Telehealth: Payer: Self-pay

## 2020-07-27 NOTE — Telephone Encounter (Signed)
Please call in 90 day supply to [facility] for Benazepril

## 2020-07-29 NOTE — Telephone Encounter (Signed)
This medication was sent 07-27-20

## 2020-08-10 ENCOUNTER — Other Ambulatory Visit: Payer: Self-pay | Admitting: "Endocrinology

## 2020-08-23 ENCOUNTER — Other Ambulatory Visit: Payer: Self-pay | Admitting: Family Medicine

## 2020-08-23 ENCOUNTER — Other Ambulatory Visit: Payer: Self-pay

## 2020-08-23 MED ORDER — MONTELUKAST SODIUM 10 MG PO TABS: 10.0000 mg | ORAL_TABLET | Freq: Every day | ORAL | 1 refills | Status: DC

## 2020-10-19 ENCOUNTER — Other Ambulatory Visit: Payer: Self-pay | Admitting: Family Medicine

## 2020-11-08 ENCOUNTER — Other Ambulatory Visit: Payer: Self-pay | Admitting: "Endocrinology

## 2020-11-15 ENCOUNTER — Other Ambulatory Visit: Payer: Self-pay | Admitting: Family Medicine

## 2020-11-17 ENCOUNTER — Other Ambulatory Visit: Payer: Self-pay

## 2020-11-17 MED ORDER — ONETOUCH VERIO VI STRP
ORAL_STRIP | 3 refills | Status: AC
Start: 2020-11-17 — End: ?

## 2020-11-22 ENCOUNTER — Telehealth (INDEPENDENT_AMBULATORY_CARE_PROVIDER_SITE_OTHER): Payer: PPO | Admitting: Family Medicine

## 2020-11-22 ENCOUNTER — Other Ambulatory Visit: Payer: Self-pay

## 2020-11-22 ENCOUNTER — Encounter: Payer: Self-pay | Admitting: Family Medicine

## 2020-11-22 VITALS — BP 119/76 | Ht 70.0 in | Wt 227.0 lb

## 2020-11-22 DIAGNOSIS — E782 Mixed hyperlipidemia: Secondary | ICD-10-CM | POA: Diagnosis not present

## 2020-11-22 DIAGNOSIS — E1122 Type 2 diabetes mellitus with diabetic chronic kidney disease: Secondary | ICD-10-CM

## 2020-11-22 DIAGNOSIS — N182 Chronic kidney disease, stage 2 (mild): Secondary | ICD-10-CM

## 2020-11-22 DIAGNOSIS — E669 Obesity, unspecified: Secondary | ICD-10-CM

## 2020-11-22 DIAGNOSIS — I10 Essential (primary) hypertension: Secondary | ICD-10-CM

## 2020-11-22 DIAGNOSIS — E559 Vitamin D deficiency, unspecified: Secondary | ICD-10-CM

## 2020-11-22 DIAGNOSIS — Z794 Long term (current) use of insulin: Secondary | ICD-10-CM

## 2020-11-22 DIAGNOSIS — N528 Other male erectile dysfunction: Secondary | ICD-10-CM | POA: Diagnosis not present

## 2020-11-22 NOTE — Assessment & Plan Note (Signed)
Controlled and managed by [name] is reminded of the importance of commitment to daily physical activity for 30 minutes or more, as able and the need to limit carbohydrate intake to 30 to 60 grams per meal to help with blood sugar control.   The need to take medication as prescribed, test blood sugar as directed, and to call between visits if there is a concern that blood sugar is uncontrolled is also discussed.   Travis Herrera is reminded of the importance of daily foot exam, annual eye examination, and good blood sugar, blood pressure and cholesterol control.  Diabetic Labs Latest Ref Rng & Units 07/22/2020 07/13/2020 03/08/2020 11/07/2019 10/31/2019  HbA1c 4.0 - 5.6 % 7.0(A) - 7.0(A) 6.9(A) -  Microalbumin mg/dL - - - - 1.0  Micro/Creat Ratio <30 mcg/mg creat - - - - 9  Chol <200 mg/dL - 86 - - 116  HDL > OR = 40 mg/dL - 27(L) - - 28(L)  Calc LDL mg/dL (calc) - 36 - - 64  Triglycerides <150 mg/dL - 150(H) - - 164(H)  Creatinine 0.70 - 1.25 mg/dL - 1.63(H) - - 1.74(H)   BP/Weight 11/22/2020 07/26/2020 07/22/2020 05/19/2020 03/08/2020 11/07/2019 62/83/1517  Systolic BP 616 073 710 626 948 546 270  Diastolic BP 76 68 68 82 69 72 80  Wt. (Lbs) 227 229 229.6 232 226.4 227.6 219  BMI 32.57 32.86 32.94 33.29 32.49 32.66 31.42   Foot/eye exam completion dates Latest Ref Rng & Units 05/19/2020 03/12/2019  Eye Exam No Retinopathy - -  Foot Form Completion - Done Done

## 2020-11-22 NOTE — Assessment & Plan Note (Signed)
Good response to medication, continue same 

## 2020-11-22 NOTE — Addendum Note (Signed)
Addended by: Laretta Bolster on: 11/22/2020 09:11 AM   Modules accepted: Orders

## 2020-11-22 NOTE — Assessment & Plan Note (Signed)
Hyperlipidemia:Low fat diet discussed and encouraged.   Lipid Panel  Lab Results  Component Value Date   CHOL 86 07/13/2020   HDL 27 (L) 07/13/2020   LDLCALC 36 07/13/2020   TRIG 150 (H) 07/13/2020   CHOLHDL 3.2 07/13/2020     Updated lab needed at/ before next visit. Needs to reduce fat and increase exercise

## 2020-11-22 NOTE — Progress Notes (Signed)
Virtual Visit via Telephone Note  I connected with [name] on 11/22/20 at  8:00 AM EST by telephone and verified that I am speaking with the correct person using two identifiers.  Location: Patient: home  Provider: office   I discussed the limitations, risks, security and privacy concerns of performing an evaluation and management service by telephone and the availability of in person appointments. I also discussed with the patient that there may be a patient responsible charge related to this service. The patient expressed understanding and agreed to proceed.   History of Present Illness:   F/U chronic problems and address any new or current concerns. Review and update medications and allergies. Review recent lab and radiologic data . Update routine health maintainace. Review an encourage improved health habits to include nutrition, exercise and  sleep .  Denies recent fever or chills. Denies polyuria, polydipsia, blurred vision , or hypoglycemic episodes.  Denies sinus pressure, nasal congestion, ear pain or sore throat. Denies chest congestion, productive cough or wheezing. Denies chest pains, palpitations and leg swelling Denies abdominal pain, nausea, vomiting,diarrhea or constipation.   Denies dysuria, frequency, hesitancy or incontinence. C/o knee   joint pain,  and limitation in mobility.Uses tylenol, leg elevation and topicals Denies headaches, seizures, numbness, or tingling. Denies depression, anxiety or insomnia. Denies skin break down or rash.     Observations/Objective: BP 119/76   Ht 5\' 10"  (1.778 m)   Wt 227 lb (103 kg)   BMI 32.57 kg/m  Good communication with no confusion and intact memory. Alert and oriented x 3 No signs of respiratory distress during speech    Assessment and Plan: Benign hypertension Controlled, no change in medication DASH diet and commitment to daily physical activity for a minimum of 30 minutes discussed and  encouraged, as a part of hypertension management. The importance of attaining a healthy weight is also discussed.  BP/Weight 11/22/2020 07/26/2020 07/22/2020 05/19/2020 03/08/2020 11/07/2019 22/11/5425  Systolic BP 062 376 283 151 761 607 371  Diastolic BP 76 68 68 82 69 72 80  Wt. (Lbs) 227 229 229.6 232 226.4 227.6 219  BMI 32.57 32.86 32.94 33.29 32.49 32.66 31.42       Mixed hyperlipidemia Hyperlipidemia:Low fat diet discussed and encouraged.   Lipid Panel  Lab Results  Component Value Date   CHOL 86 07/13/2020   HDL 27 (L) 07/13/2020   LDLCALC 36 07/13/2020   TRIG 150 (H) 07/13/2020   CHOLHDL 3.2 07/13/2020     Updated lab needed at/ before next visit. Needs to reduce fat and increase exercise  Type 2 diabetes mellitus with stage 2 chronic kidney disease, with long-term current use of insulin ([place]) Controlled and managed by Endo Mr. Hanley is reminded of the importance of commitment to daily physical activity for 30 minutes or more, as able and the need to limit carbohydrate intake to 30 to 60 grams per meal to help with blood sugar control.   The need to take medication as prescribed, test blood sugar as directed, and to call between visits if there is a concern that blood sugar is uncontrolled is also discussed.   Mr. Oberman is reminded of the importance of daily foot exam, annual eye examination, and good blood sugar, blood pressure and cholesterol control.  Diabetic Labs Latest Ref Rng & Units 07/22/2020 07/13/2020 03/08/2020 11/07/2019 10/31/2019  HbA1c 4.0 - 5.6 % 7.0(A) - 7.0(A) 6.9(A) -  Microalbumin mg/dL - - - - 1.0  Micro/Creat Ratio <30 mcg/mg  creat - - - - 9  Chol <200 mg/dL - 86 - - 116  HDL > OR = 40 mg/dL - 27(L) - - 28(L)  Calc LDL mg/dL (calc) - 36 - - 64  Triglycerides <150 mg/dL - 150(H) - - 164(H)  Creatinine 0.70 - 1.25 mg/dL - 1.63(H) - - 1.74(H)   BP/Weight 11/22/2020 07/26/2020 07/22/2020 05/19/2020 03/08/2020 11/07/2019 59/74/1638  Systolic BP 453 646 803  212 248 250 037  Diastolic BP 76 68 68 82 69 72 80  Wt. (Lbs) 227 229 229.6 232 226.4 227.6 219  BMI 32.57 32.86 32.94 33.29 32.49 32.66 31.42   Foot/eye exam completion dates Latest Ref Rng & Units 05/19/2020 03/12/2019  Eye Exam No Retinopathy - -  Foot Form Completion - Done Done        Obesity (BMI 30.0-34.9)  Patient re-educated about  the importance of commitment to a  minimum of 150 minutes of exercise per week as able.  The importance of healthy food choices with portion control discussed, as well as eating regularly and within a 12 hour window most days. The need to choose "clean , green" food 50 to 75% of the time is discussed, as well as to make water the primary drink and set a goal of 64 ounces water daily.    Weight /BMI 11/22/2020 07/26/2020 07/22/2020  WEIGHT 227 lb 229 lb 229 lb 9.6 oz  HEIGHT 5\' 10"  5\' 10"  5\' 10"   BMI 32.57 kg/m2 32.86 kg/m2 32.94 kg/m2      Erectile dysfunction Good response to medication, continue  same     Follow Up Instructions:    I discussed the assessment and treatment plan with the patient. The patient was provided an opportunity to ask questions and all were answered. The patient agreed with the plan and demonstrated an understanding of the instructions.   The patient was advised to call back or seek an in-person evaluation if the symptoms worsen or if the condition fails to improve as anticipated.  I provided 15  minutes of non-face-to-face time during this encounter.   Tula Nakayama, MD

## 2020-11-22 NOTE — Assessment & Plan Note (Signed)
  Patient re-educated about  the importance of commitment to a  minimum of 150 minutes of exercise per week as able.  The importance of healthy food choices with portion control discussed, as well as eating regularly and within a 12 hour window most days. The need to choose "clean , green" food 50 to 75% of the time is discussed, as well as to make water the primary drink and set a goal of 64 ounces water daily.    Weight /BMI 11/22/2020 07/26/2020 07/22/2020  WEIGHT 227 lb 229 lb 229 lb 9.6 oz  HEIGHT 5\' 10"  5\' 10"  5\' 10"   BMI 32.57 kg/m2 32.86 kg/m2 32.94 kg/m2

## 2020-11-22 NOTE — Assessment & Plan Note (Signed)
Controlled, no change in medication DASH diet and commitment to daily physical activity for a minimum of 30 minutes discussed and encouraged, as a part of hypertension management. The importance of attaining a healthy weight is also discussed.  BP/Weight 11/22/2020 07/26/2020 07/22/2020 05/19/2020 03/08/2020 11/07/2019 96/29/5284  Systolic BP 132 440 102 725 366 440 347  Diastolic BP 76 68 68 82 69 72 80  Wt. (Lbs) 227 229 229.6 232 226.4 227.6 219  BMI 32.57 32.86 32.94 33.29 32.49 32.66 31.42

## 2020-11-22 NOTE — Patient Instructions (Addendum)
Annual physical exam In office in October when due, call if you need [place] sooner  Please get fasting lipid, cmp and EGFr, and microalb and vit D mid March  No changes in medication at this time   Continue healthy habits!  It is important that you exercise regularly at least 30 minutes 5 times a week. If you develop chest pain, have severe difficulty breathing, or feel very tired, stop exercising immediately and seek medical attention  Think about what you will eat, plan ahead. Choose " clean, green, fresh or frozen" over canned, processed or packaged foods which are more sugary, salty and fatty. 70 to 75% of food eaten should be vegetables and fruit. Three meals at set times with snacks allowed between meals, but they must be fruit or vegetables. Aim to eat over a 12 hour period , example 7 am to 7 pm, and STOP after  your last meal of the day. Drink water,generally about 64 ounces per day, no other drink is as healthy. Fruit juice is best enjoyed in a healthy way, by EATING the fruit. Thanks for choosing [facility], we consider it a privelige to serve you.

## 2021-01-10 ENCOUNTER — Telehealth: Payer: Self-pay

## 2021-01-10 ENCOUNTER — Other Ambulatory Visit: Payer: Self-pay

## 2021-01-10 MED ORDER — FREESTYLE LIBRE SENSOR SYSTEM MISC: 5 refills | Status: DC

## 2021-01-10 MED ORDER — FREESTYLE LIBRE READER DEVI: 5 refills | Status: DC

## 2021-01-10 MED ORDER — FREESTYLE LIBRE 14 DAY SENSOR MISC: 5 refills | Status: DC

## 2021-01-10 NOTE — Telephone Encounter (Signed)
Patient spouse called said patient Insurance now will cover the freestyle lybric 2 diabetic machine and strips. Can it be sent in to the pharmacy.  Pharm[facility]: [facility]

## 2021-01-10 NOTE — Telephone Encounter (Signed)
sent 

## 2021-01-14 ENCOUNTER — Encounter: Payer: Self-pay | Admitting: Family Medicine

## 2021-01-15 ENCOUNTER — Other Ambulatory Visit: Payer: Self-pay | Admitting: Family Medicine

## 2021-01-17 ENCOUNTER — Other Ambulatory Visit: Payer: Self-pay

## 2021-01-17 DIAGNOSIS — E782 Mixed hyperlipidemia: Secondary | ICD-10-CM | POA: Diagnosis not present

## 2021-01-17 DIAGNOSIS — E1122 Type 2 diabetes mellitus with diabetic chronic kidney disease: Secondary | ICD-10-CM | POA: Diagnosis not present

## 2021-01-17 DIAGNOSIS — N182 Chronic kidney disease, stage 2 (mild): Secondary | ICD-10-CM | POA: Diagnosis not present

## 2021-01-17 DIAGNOSIS — E559 Vitamin D deficiency, unspecified: Secondary | ICD-10-CM

## 2021-01-17 DIAGNOSIS — I10 Essential (primary) hypertension: Secondary | ICD-10-CM | POA: Diagnosis not present

## 2021-01-17 DIAGNOSIS — Z794 Long term (current) use of insulin: Secondary | ICD-10-CM | POA: Diagnosis not present

## 2021-01-18 ENCOUNTER — Telehealth: Payer: Self-pay | Admitting: Nurse Practitioner

## 2021-01-18 DIAGNOSIS — Z794 Long term (current) use of insulin: Secondary | ICD-10-CM

## 2021-01-18 DIAGNOSIS — N1831 Chronic kidney disease, stage 3a: Secondary | ICD-10-CM

## 2021-01-18 LAB — COMPREHENSIVE METABOLIC PANEL
ALT: 240 IU/L — ABNORMAL HIGH (ref 0–44)
AST: 124 IU/L — ABNORMAL HIGH (ref 0–40)
Albumin/Globulin Ratio: 1.7 (ref 1.2–2.2)
Albumin: 4.4 g/dL (ref 3.8–4.8)
Alkaline Phosphatase: 80 IU/L (ref 44–121)
BUN/Creatinine Ratio: 16 (ref 10–24)
BUN: 26 mg/dL (ref 8–27)
Bilirubin Total: 0.3 mg/dL (ref 0.0–1.2)
CO2: 22 mmol/L (ref 20–29)
Calcium: 9.5 mg/dL (ref 8.6–10.2)
Chloride: 101 mmol/L (ref 96–106)
Creatinine, Ser: 1.61 mg/dL — ABNORMAL HIGH (ref 0.76–1.27)
Globulin, Total: 2.6 g/dL (ref 1.5–4.5)
Glucose: 140 mg/dL — ABNORMAL HIGH (ref 65–99)
Potassium: 4.6 mmol/L (ref 3.5–5.2)
Sodium: 142 mmol/L (ref 134–144)
Total Protein: 7 g/dL (ref 6.0–8.5)
eGFR: 48 mL/min/{1.73_m2} — ABNORMAL LOW (ref >59)

## 2021-01-18 LAB — CBC
Hematocrit: 36.7 % — ABNORMAL LOW (ref 37.5–51.0)
Hemoglobin: 12.4 g/dL — ABNORMAL LOW (ref 13.0–17.7)
MCH: 30.8 pg (ref 26.6–33.0)
MCHC: 33.8 g/dL (ref 31.5–35.7)
MCV: 91 fL (ref 79–97)
Platelets: 295 10*3/uL (ref 150–450)
RBC: 4.03 x10E6/uL — ABNORMAL LOW (ref 4.14–5.80)
RDW: 13.2 % (ref 11.6–15.4)
WBC: 9.3 10*3/uL (ref 3.4–10.8)

## 2021-01-18 LAB — LIPID PANEL
Chol/HDL Ratio: 3.3 ratio (ref 0.0–5.0)
Cholesterol, Total: 105 mg/dL (ref 100–199)
HDL: 32 mg/dL — ABNORMAL LOW (ref >39)
LDL Chol Calc (NIH): 54 mg/dL (ref 0–99)
Triglycerides: 97 mg/dL (ref 0–149)
VLDL Cholesterol Cal: 19 mg/dL (ref 5–40)

## 2021-01-18 LAB — MICROALBUMIN / CREATININE URINE RATIO
Creatinine, Urine: 161.3 mg/dL
Microalb/Creat Ratio: 12 mg/g creat (ref 0–29)
Microalbumin, Urine: 19.8 ug/mL

## 2021-01-18 LAB — VITAMIN D 25 HYDROXY (VIT D DEFICIENCY, FRACTURES): Vit D, 25-Hydroxy: 35.1 ng/mL (ref 30.0–100.0)

## 2021-01-18 LAB — HEMOGLOBIN A1C
Est. average glucose Bld gHb Est-mCnc: 177 mg/dL
Hgb A1c MFr Bld: 7.8 % — ABNORMAL HIGH (ref 4.8–5.6)

## 2021-01-18 NOTE — Progress Notes (Signed)
Wanted your guidance on what to do about his elevated liver function tests since they are so significant

## 2021-01-18 NOTE — Telephone Encounter (Signed)
Spoke with patient, he had been taking up to 1500mg  of tylenol daily, stated that he stopped taking it on Friday, he was having back pain, stated that he read a side effect from one of his medications was it could cause back pain, since he stopped taking that pill his back pain went away. Do you want to order the Lipid panel?

## 2021-01-18 NOTE — Telephone Encounter (Signed)
Pt wants to speak to the nurse about his kidney function lab number he just got back. [phone]

## 2021-01-18 NOTE — Telephone Encounter (Signed)
I saw them.  The one thing that was concerning to me was his liver enzymes.  They are significantly elevated from previous blood samples.  I know he has knee pain, has he been taking Tylenol or drinking alcohol recently?  If so, he needs to stop them as that could be affecting his liver tests.  I want to repeat his liver test in 1 week just to monitor further and decide on the next course of action.  All other results can wait until next weeks appt.

## 2021-01-18 NOTE — Telephone Encounter (Signed)
Returned call to patient , advised that the provider will review results with patient at time of appointment and that the provider has the results already in EMR, he had stated that he had seen the results on my chart and wanted to know if there is anything to be concerned with

## 2021-01-19 NOTE — Telephone Encounter (Signed)
I ordered the repeat labs, to be done next week. His tylenol use may have contributed to his high liver enzymes.  It should recover now that he has stopped taking it.

## 2021-01-24 DIAGNOSIS — Z794 Long term (current) use of insulin: Secondary | ICD-10-CM | POA: Diagnosis not present

## 2021-01-24 DIAGNOSIS — N1831 Chronic kidney disease, stage 3a: Secondary | ICD-10-CM | POA: Diagnosis not present

## 2021-01-24 DIAGNOSIS — E1122 Type 2 diabetes mellitus with diabetic chronic kidney disease: Secondary | ICD-10-CM | POA: Diagnosis not present

## 2021-01-24 NOTE — Telephone Encounter (Signed)
Pt called and said he redid his labs this morning

## 2021-01-24 NOTE — Patient Instructions (Signed)

## 2021-01-25 ENCOUNTER — Other Ambulatory Visit: Payer: Self-pay

## 2021-01-25 ENCOUNTER — Encounter: Payer: Self-pay | Admitting: Nurse Practitioner

## 2021-01-25 ENCOUNTER — Ambulatory Visit: Payer: PPO | Admitting: Nurse Practitioner

## 2021-01-25 VITALS — BP 100/69 | HR 76 | Ht 70.0 in | Wt 226.8 lb

## 2021-01-25 DIAGNOSIS — N1831 Chronic kidney disease, stage 3a: Secondary | ICD-10-CM

## 2021-01-25 DIAGNOSIS — E1122 Type 2 diabetes mellitus with diabetic chronic kidney disease: Secondary | ICD-10-CM

## 2021-01-25 DIAGNOSIS — Z794 Long term (current) use of insulin: Secondary | ICD-10-CM | POA: Diagnosis not present

## 2021-01-25 DIAGNOSIS — I10 Essential (primary) hypertension: Secondary | ICD-10-CM | POA: Diagnosis not present

## 2021-01-25 DIAGNOSIS — E782 Mixed hyperlipidemia: Secondary | ICD-10-CM | POA: Diagnosis not present

## 2021-01-25 LAB — COMPREHENSIVE METABOLIC PANEL
ALT: 73 IU/L — ABNORMAL HIGH (ref 0–44)
AST: 26 IU/L (ref 0–40)
Albumin/Globulin Ratio: 1.4 (ref 1.2–2.2)
Albumin: 4.2 g/dL (ref 3.8–4.8)
Alkaline Phosphatase: 86 IU/L (ref 44–121)
BUN/Creatinine Ratio: 15 (ref 10–24)
BUN: 24 mg/dL (ref 8–27)
Bilirubin Total: 0.3 mg/dL (ref 0.0–1.2)
CO2: 23 mmol/L (ref 20–29)
Calcium: 9.8 mg/dL (ref 8.6–10.2)
Chloride: 104 mmol/L (ref 96–106)
Creatinine, Ser: 1.64 mg/dL — ABNORMAL HIGH (ref 0.76–1.27)
Globulin, Total: 3.1 g/dL (ref 1.5–4.5)
Glucose: 115 mg/dL — ABNORMAL HIGH (ref 65–99)
Potassium: 4.7 mmol/L (ref 3.5–5.2)
Sodium: 144 mmol/L (ref 134–144)
Total Protein: 7.3 g/dL (ref 6.0–8.5)
eGFR: 47 mL/min/{1.73_m2} — ABNORMAL LOW (ref >59)

## 2021-01-25 NOTE — Progress Notes (Signed)
01/25/2021  Endocrinology follow-up note   Subjective:    Patient ID: Travis Herrera, male    DOB: 11-16-1955,    Past Medical History:  Diagnosis Date  . Diabetes mellitus without complication ([place]) 9470   insulin started in 2012  . Hyperlipemia 2013  . Hypertension 2010   Past Surgical History:  Procedure Laterality Date  . CATARACT EXTRACTION W/PHACO  07/29/2012   Procedure: CATARACT EXTRACTION PHACO AND INTRAOCULAR LENS PLACEMENT (IOC);  Surgeon: Tonny Branch, MD;  Location: [facility];  Service: Ophthalmology;  Laterality: Left;  CDE=1.66  . CHOLECYSTECTOMY  2007   [facility]  . EYE SURGERY Left 2013   cataract  . QUADRICEPS TENDON REPAIR Right 03/19/2018   Procedure: REPAIR QUADRICEP TENDON;  Surgeon: Carole Civil, MD;  Location: [facility];  Service: Orthopedics;  Laterality: Right;   Social History   Socioeconomic History  . Marital status: Married    Spouse name: Not on file  . Number of children: 4  . Years of education: Not on file  . Highest education level: Not on file  Occupational History  . Not on file  Tobacco Use  . Smoking status: Never Smoker  . Smokeless tobacco: Never Used  Vaping Use  . Vaping Use: Never used  Substance and Sexual Activity  . Alcohol use: No  . Drug use: No  . Sexual activity: Yes    Birth control/protection: None  Other Topics Concern  . Not on file  Social History Narrative  . Not on file   Social Determinants of Health   Financial Resource Strain: Low Risk   . Difficulty of Paying Living Expenses: Not hard at all  Food Insecurity: No Food Insecurity  . Worried About Charity fundraiser in the Last Year: Never true  . Ran Out of Food in the Last Year: Never true  Transportation Needs: No Transportation Needs  . Lack of Transportation (Medical): No  . Lack of Transportation (Non-Medical): No  Physical Activity: Sufficiently Active  . Days of Exercise per Week: 5 days  . Minutes of Exercise per Session: 30 min   Stress: No Stress Concern Present  . Feeling of Stress : Not at all  Social Connections: Socially Integrated  . Frequency of Communication with Friends and Family: More than three times a week  . Frequency of Social Gatherings with Friends and Family: Once a week  . Attends Religious Services: More than 4 times per year  . Active Member of Clubs or Organizations: Yes  . Attends Archivist Meetings: 1 to 4 times per year  . Marital Status: Married   Outpatient Encounter Medications as of 01/25/2021  Medication Sig  . amLODipine (NORVASC) 10 MG tablet Take 1 tablet by mouth once daily  . aspirin EC 81 MG tablet Take 81 mg by mouth daily.  . benazepril (LOTENSIN) 20 MG tablet Take 1 tablet by mouth once daily  . Continuous Blood Gluc Receiver (FREESTYLE LIBRE READER) DEVI Use to check blood sugar daily dx E11.65  . Continuous Blood Gluc Sensor (FREESTYLE LIBRE 14 DAY SENSOR) MISC Use to check blood sugar daily dx E11.65  . Continuous Blood Gluc Sensor (FREESTYLE LIBRE SENSOR SYSTEM) MISC Use to check blood sugar daily dx E11.65  . glipiZIDE (GLUCOTROL XL) 5 MG 24 hr tablet Take 1 tablet by mouth once daily with breakfast  . glucose blood (ONETOUCH VERIO) test strip USE 1 STRIP TO CHECK GLUCOSE THREE TIMES DAILY AS DIRECTED DX e11.65  .  Insulin Glargine (LANTUS SOLOSTAR) 100 UNIT/ML Solostar Pen Inject 50 Units into the skin daily with breakfast.  . Insulin Pen Needle 31G X 8 MM MISC 1 each by Does not apply route 2 (two) times daily.  . Lancets (FREESTYLE) lancets Use as instructed bid  . liraglutide (VICTOZA) 18 MG/3ML SOPN Inject 0.3 mLs (1.8 mg total) into the skin every morning. INJECT 1.8 MG INTO THE SKIN DAILY AS DIRECTED  . montelukast (SINGULAIR) 10 MG tablet Take 1 tablet (10 mg total) by mouth at bedtime.  Glory Rosebush DELICA LANCETS 44W MISC Three times daily testing dx e11.65  . rosuvastatin (CRESTOR) 10 MG tablet Take 1 tablet (10 mg total) by mouth daily.  . tadalafil  (CIALIS) 20 MG tablet Take 1 tablet (20 mg total) by mouth daily as needed for erectile dysfunction.  . triamterene-hydrochlorothiazide (MAXZIDE-25) 37.5-25 MG tablet Take 1 tablet by mouth once daily  . [DISCONTINUED] Saw Palmetto 1000 MG CAPS Take 1,000 mg by mouth daily.   No facility-administered encounter medications on file as of 01/25/2021.   ALLERGIES: No Known Allergies VACCINATION STATUS: Immunization History  Administered Date(s) Administered  . Influenza Split 07/17/2014  . Influenza,inj,Quad PF,6+ Mos 08/16/2015, 07/26/2016, 08/02/2017, 07/19/2018, 06/16/2019, 07/02/2020  . Moderna Sars-Covid-2 Vaccination 12/27/2019, 01/28/2020  . PFIZER(Purple Top)SARS-COV-2 Vaccination 09/24/2020  . Pneumococcal Polysaccharide-23 09/15/2019    Diabetes He presents for his follow-up diabetic visit. He has type 2 diabetes mellitus. Onset time: He was diagnosed at approximate age of 42 years. His disease course has been worsening. There are no hypoglycemic associated symptoms. Pertinent negatives for hypoglycemia include no confusion, headaches, pallor or seizures. Pertinent negatives for diabetes include no chest pain, no fatigue, no polydipsia, no polyphagia, no polyuria and no weakness. There are no hypoglycemic complications. Symptoms are stable. Diabetic complications include nephropathy. Risk factors for coronary artery disease include diabetes mellitus, dyslipidemia, hypertension, family history, male sex, sedentary lifestyle and obesity. Current diabetic treatment includes insulin injections and oral agent (monotherapy). He is compliant with treatment most of the time. His weight is fluctuating minimally. He is following a generally unhealthy diet. When asked about meal planning, he reported none. He has not had a previous visit with a dietitian. He rarely participates in exercise. His home blood glucose trend is fluctuating minimally. His breakfast blood glucose range is generally 90-110  mg/dl. His bedtime blood glucose range is generally 140-180 mg/dl. (He presents today with his meter, CGM, and logs showing stable, at goal glycemic profile both fasting and postprandially.  His previsit A1c was 7.8%, increasing slightly from previous visit of 7%.  Analysis of his CGM shows TIR 91%, TAR 8%, TBR 1%.  He did have a period of time where he ran out of his Victoza (on backorder) and he did notice an increase in his glucose at that time.  He denies any significant hypoglycemia.) An ACE inhibitor/angiotensin II receptor blocker is being taken. He does not see a podiatrist.Eye exam is current.  Hypertension This is a chronic problem. The current episode started more than 1 year ago. The problem has been gradually improving since onset. The problem is controlled. Pertinent negatives include no chest pain, headaches, neck pain, palpitations or shortness of breath. There are no associated agents to hypertension. Risk factors for coronary artery disease include dyslipidemia, diabetes mellitus, male gender, obesity and sedentary lifestyle. Past treatments include ACE inhibitors, diuretics and calcium channel blockers. The current treatment provides moderate improvement. There are no compliance problems.  Hypertensive end-organ damage includes kidney  disease. Identifiable causes of hypertension include chronic renal disease.  Hyperlipidemia This is a chronic problem. The current episode started more than 1 year ago. The problem is controlled. Recent lipid tests were reviewed and are normal. Exacerbating diseases include chronic renal disease, diabetes and obesity. Factors aggravating his hyperlipidemia include fatty foods. Pertinent negatives include no chest pain, myalgias or shortness of breath. Current antihyperlipidemic treatment includes statins. The current treatment provides moderate improvement of lipids. Compliance problems include adherence to diet and adherence to exercise.  Risk factors for  coronary artery disease include dyslipidemia, diabetes mellitus, hypertension, male sex, a sedentary lifestyle and obesity.    Review of systems  Constitutional: + Minimally fluctuating body weight,  current Body mass index is 32.54 kg/m. , no fatigue, no subjective hyperthermia, no subjective hypothermia Eyes: no blurry vision, no xerophthalmia ENT: no sore throat, no nodules palpated in throat, no dysphagia/odynophagia, no hoarseness Cardiovascular: no chest pain, no shortness of breath, no palpitations, no leg swelling Respiratory: no cough, no shortness of breath Gastrointestinal: no nausea/vomiting/diarrhea Musculoskeletal: c/o intermittent back pain (improving since he stopped his OTC saw palmetto supplement) Skin: no rashes, no hyperemia Neurological: no tremors, no numbness, no tingling, no dizziness Psychiatric: no depression, no anxiety    Objective:    BP 100/69 (BP Location: Left Arm, Patient Position: Sitting)   Pulse 76   Ht _0  (1.778 m)   Wt 226 lb 12.8 oz (102.9 kg)   BMI 32.54 kg/m   Wt Readings from Last 3 Encounters:  01/25/21 226 lb 12.8 oz (102.9 kg)  11/22/20 227 lb (103 kg)  07/26/20 229 lb (103.9 kg)    BP Readings from Last 3 Encounters:  01/25/21 100/69  11/22/20 119/76  07/26/20 108/68    Physical Exam- Limited  Constitutional:  Body mass index is 32.54 kg/m. , not in acute distress, normal state of mind Eyes:  EOMI, no exophthalmos Neck: Supple Cardiovascular: RRR, no murmers, rubs, or gallops, no edema Respiratory: Adequate breathing efforts, no crackles, rales, rhonchi, or wheezing Musculoskeletal: no gross deformities, strength intact in all four extremities, no gross restriction of joint movements Skin:  no rashes, no hyperemia Neurological: no tremor with outstretched hands    Results for orders placed or performed in visit on 01/18/21  Comprehensive metabolic panel  Result Value Ref Range   Glucose 115 (H) 65 - 99 mg/dL    BUN 24 8 - 27 mg/dL   Creatinine, Ser 1.64 (H) 0.76 - 1.27 mg/dL   eGFR 47 (L) >59 mL/min/1.73   BUN/Creatinine Ratio 15 10 - 24   Sodium 144 134 - 144 mmol/L   Potassium 4.7 3.5 - 5.2 mmol/L   Chloride 104 96 - 106 mmol/L   CO2 23 20 - 29 mmol/L   Calcium 9.8 8.6 - 10.2 mg/dL   Total Protein 7.3 6.0 - 8.5 g/dL   Albumin 4.2 3.8 - 4.8 g/dL   Globulin, Total 3.1 1.5 - 4.5 g/dL   Albumin/Globulin Ratio 1.4 1.2 - 2.2   Bilirubin Total 0.3 0.0 - 1.2 mg/dL   Alkaline Phosphatase 86 44 - 121 IU/L   AST 26 0 - 40 IU/L   ALT 73 (H) 0 - 44 IU/L   Diabetic Labs (most recent): Lab Results  Component Value Date   HGBA1C 7.8 (H) 01/17/2021   HGBA1C 7.0 (A) 07/22/2020   HGBA1C 7.0 (A) 03/08/2020   Lipid Panel     Component Value Date/Time   CHOL 105 01/17/2021 0830  TRIG 97 01/17/2021 0830   HDL 32 (L) 01/17/2021 0830   CHOLHDL 3.3 01/17/2021 0830   CHOLHDL 3.2 07/13/2020 0840   VLDL 12 01/16/2017 0940   LDLCALC 54 01/17/2021 0830   LDLCALC 36 07/13/2020 0840      Assessment & Plan:   1) Diabetes mellitus type 2 without complications, with long term insulin use ([place])  He presents today with his meter, CGM, and logs showing stable, at goal glycemic profile both fasting and postprandially.  His previsit A1c was 7.8%, increasing slightly from previous visit of 7%.  Analysis of his CGM shows TIR 91%, TAR 8%, TBR 1%.  He did have a period of time where he ran out of his Victoza (on backorder) and he did notice an increase in his glucose at that time.  He denies any significant hypoglycemia.    Recent labs reviewed, showing mild renal insufficiency (stable).  He also had significantly high liver enzymes, he had been taking Tylenol more for back pain (which started after he started taking an OTC saw palmetto supplement) and since has stopped, recheck shows substantial improvement in liver enzymes.  - Patient remains at a high risk for more acute and chronic complications of diabetes which  include CAD, CVA, CKD, retinopathy, and neuropathy. These are all discussed in detail with the patient.  - Nutritional counseling repeated at each appointment due to patients tendency to fall back in to old habits.  - The patient admits there is a room for improvement in their diet and drink choices. -  Suggestion is made for the patient to avoid simple carbohydrates from their diet including Cakes, Sweet Desserts / Pastries, Ice Cream, Soda (diet and regular), Sweet Tea, Candies, Chips, Cookies, Sweet Pastries,  Store Bought Juices, Alcohol in Excess of  1-2 drinks a day, Artificial Sweeteners, Coffee Creamer, and "Sugar-free" Products. This will help patient to have stable blood glucose profile and potentially avoid unintended weight gain.   - I encouraged the patient to switch to  unprocessed or minimally processed complex starch and increased protein intake (animal or plant source), fruits, and vegetables.   - Patient is advised to stick to a routine mealtimes to eat 3 meals  a day and avoid unnecessary snacks ( to snack only to correct hypoglycemia).  - I have approached patient with the following individualized plan to manage diabetes and patient agrees.  Given his stable glycemic profile without hypoglycemia, he is advised to continue with same current medication regimen as follows: -Lantus 50 units SQ daily at breakfast -Victoza 1.8 SQ daily -Glipizide 5 mg XL daily with breakfast  -He is advised to continue monitoring blood glucose at least twice daily using his CGM, before breakfast and before bed, and call the clinic if he gets readings less than 70 or greater than 200 for 3 tests in a row.  - Patient specific target  for A1c; LDL, HDL, Triglycerides, and  Waist Circumference were discussed in detail.  2) BP/HTN: His blood pressure is controlled to target.  He is advised to continue Amlodipine 10 mg po daily, Benazepril 20 mg po daily, and Triamterene-HCT 37.5-25 mg po daily.  3)  Lipids/HPL: Most recent lipid panel from 01/17/21 shows controlled LDL at 54.  He is advised to continue Crestor 10 mg po daily at bedtime.  Side effects and precautions discussed with him.  He is advised to avoid fried foods and butter.   4)  Weight/Diet:  His Body mass index is 32.54 kg/m.-is a  candidate for modest weight loss.  CDE consult in progress, exercise, and carbohydrates information provided.  5) Chronic Care/Health Maintenance: -Patient on ACEI and Statin medications and encouraged to continue to follow up with Ophthalmology, Podiatrist at least yearly or according to recommendations, and advised to stay away from smoking. I have recommended yearly flu vaccine and pneumonia vaccination at least every 5 years; moderate intensity exercise for up to 150 minutes weekly; and  sleep for at least 7 hours a day.  I advised patient to maintain close follow up with his PCP for primary care needs.    I spent 40 minutes in the care of the patient today including review of labs from [facility], Lipids, Thyroid Function, Hematology (current and previous including abstractions from other facilities); face-to-face time discussing  his blood glucose readings/logs, discussing hypoglycemia and hyperglycemia episodes and symptoms, medications doses, his options of short and long term treatment based on the latest standards of care / guidelines;  discussion about incorporating lifestyle medicine;  and documenting the encounter.    Please refer to Patient Instructions for Blood Glucose Monitoring and Insulin/Medications Dosing Guide"  in media tab for additional information. Please  also refer to " Patient Self Inventory" in the Media  tab for reviewed elements of pertinent patient history.  Travis Herrera participated in the discussions, expressed understanding, and voiced agreement with the above plans.  All questions were answered to his satisfaction. he is encouraged to contact clinic should he have any  questions or concerns prior to his return visit.    Follow up plan: Return in about 4 months (around 05/27/2021) for Diabetes follow up with A1c in office, No previsit labs, Bring glucometer and logs.  Rayetta Pigg, [facility] [address] [place],  [address] Phone: [phone] Fax: [phone]  01/25/2021, 8:39 AM

## 2021-02-01 ENCOUNTER — Other Ambulatory Visit: Payer: Self-pay | Admitting: Family Medicine

## 2021-02-08 ENCOUNTER — Other Ambulatory Visit: Payer: Self-pay | Admitting: Nurse Practitioner

## 2021-02-09 ENCOUNTER — Other Ambulatory Visit: Payer: Self-pay | Admitting: Nurse Practitioner

## 2021-02-17 ENCOUNTER — Telehealth: Payer: Self-pay

## 2021-02-17 NOTE — Telephone Encounter (Signed)
Can this be refilled? 

## 2021-02-17 NOTE — Telephone Encounter (Signed)
Patient called states he is having a issue with dry skin in his ears and that Dr Moshe Cipro had previously given him a ointment for this and he is wanting a refill sent to [facility] in [place] ph# [phone]

## 2021-02-17 NOTE — Telephone Encounter (Signed)
Don't see medication on file. I recommend starting with sparing use of OTC hydrocortisone cream

## 2021-02-18 ENCOUNTER — Other Ambulatory Visit: Payer: Self-pay | Admitting: Family Medicine

## 2021-02-18 MED ORDER — CLOTRIMAZOLE-BETAMETHASONE 1-0.05 % EX CREA: TOPICAL_CREAM | CUTANEOUS | 0 refills | Status: DC

## 2021-02-18 NOTE — Telephone Encounter (Signed)
Pt informed

## 2021-02-18 NOTE — Telephone Encounter (Signed)
Pt wife states they have tried using the hydrocortisone cream as well as aquaphor daily without relief. Pt states that he was thinking the last ointment that helped was a steroid.

## 2021-02-18 NOTE — Telephone Encounter (Signed)
Medication sent to [facility], pls let him know

## 2021-03-21 ENCOUNTER — Other Ambulatory Visit: Payer: Self-pay | Admitting: Family Medicine

## 2021-03-29 ENCOUNTER — Encounter: Payer: Self-pay | Admitting: Nurse Practitioner

## 2021-03-29 ENCOUNTER — Other Ambulatory Visit: Payer: Self-pay | Admitting: Family Medicine

## 2021-03-29 ENCOUNTER — Other Ambulatory Visit: Payer: Self-pay

## 2021-03-29 ENCOUNTER — Telehealth (INDEPENDENT_AMBULATORY_CARE_PROVIDER_SITE_OTHER): Payer: PPO | Admitting: Nurse Practitioner

## 2021-03-29 DIAGNOSIS — U071 COVID-19: Secondary | ICD-10-CM

## 2021-03-29 HISTORY — DX: COVID-19: U07.1

## 2021-03-29 MED ORDER — CORICIDIN HBP COUGH/COLD 4-30 MG PO TABS: 1.0000 | ORAL_TABLET | Freq: Four times a day (QID) | ORAL | 0 refills | Status: DC | PRN

## 2021-03-29 MED ORDER — MOLNUPIRAVIR EUA 200MG CAPSULE: 4.0000 | ORAL_CAPSULE | Freq: Two times a day (BID) | ORAL | 0 refills | Status: DC

## 2021-03-29 MED ORDER — MOLNUPIRAVIR EUA 200MG CAPSULE: 4.0000 | ORAL_CAPSULE | Freq: Two times a day (BID) | ORAL | 0 refills | Status: AC

## 2021-03-29 NOTE — Assessment & Plan Note (Signed)
-  home COVID test was positive, and he has been symptomatic x 3 days -Rx. molnupiravir -Rx. coricidin

## 2021-03-29 NOTE — Progress Notes (Signed)
Established Patient Office Visit  Subjective:  Patient ID: Travis Herrera, male    DOB: 1956-09-19  Age: 65 y.o. MRN: [id number]  CC:  Chief Complaint  Patient presents with   Cough    X 3 days   Fever    X 2 days    Nasal Congestion    X 3 days    Covid Positive    HPI Travis Herrera presents for COVID-19.  Past Medical History:  Diagnosis Date   Diabetes mellitus without complication ([place]) 2094   insulin started in 2012   Hyperlipemia 2013   Hypertension 2010    Past Surgical History:  Procedure Laterality Date   CATARACT EXTRACTION W/PHACO  07/29/2012   Procedure: CATARACT EXTRACTION PHACO AND INTRAOCULAR LENS PLACEMENT ([place]);  Surgeon: Tonny Branch, MD;  Location: [facility];  Service: Ophthalmology;  Laterality: Left;  CDE=1.66   CHOLECYSTECTOMY  2007   [facility]   EYE SURGERY Left 2013   cataract   QUADRICEPS TENDON REPAIR Right 03/19/2018   Procedure: REPAIR QUADRICEP TENDON;  Surgeon: Carole Civil, MD;  Location: [facility];  Service: Orthopedics;  Laterality: Right;    Family History  Problem Relation Age of Onset   Cancer Sister    Early death Father        car accident    Cancer Brother    Hypertension Brother    Diabetes Brother    Hypertension Brother     Social History   Socioeconomic History   Marital status: Married    Spouse name: Not on file   Number of children: 4   Years of education: Not on file   Highest education level: Not on file  Occupational History   Not on file  Tobacco Use   Smoking status: Never   Smokeless tobacco: Never  Vaping Use   Vaping Use: Never used  Substance and Sexual Activity   Alcohol use: No   Drug use: No   Sexual activity: Yes    Birth control/protection: None  Other Topics Concern   Not on file  Social History Narrative   Not on file   Social Determinants of Health   Financial Resource Strain: Low Risk    Difficulty of Paying Living Expenses: Not hard at all  Food Insecurity: No Food  Insecurity   Worried About Charity fundraiser in the Last Year: Never true   [place] in the Last Year: Never true  Transportation Needs: No Transportation Needs   Lack of Transportation (Medical): No   Lack of Transportation (Non-Medical): No  Physical Activity: Sufficiently Active   Days of Exercise per Week: 5 days   Minutes of Exercise per Session: 30 min  Stress: No Stress Concern Present   Feeling of Stress : Not at all  Social Connections: Socially Integrated   Frequency of Communication with Friends and Family: More than three times a week   Frequency of Social Gatherings with Friends and Family: Once a week   Attends Religious Services: More than 4 times per year   Active Member of [employer] or Organizations: Yes   Attends Archivist Meetings: 1 to 4 times per year   Marital Status: Married  Human resources officer Violence: Not At Risk   Fear of Current or Ex-Partner: No   Emotionally Abused: No   Physically Abused: No   Sexually Abused: No    Outpatient Medications Prior to Visit  Medication Sig Dispense Refill  amLODipine (NORVASC) 10 MG tablet Take 1 tablet by mouth once daily 90 tablet 0   aspirin EC 81 MG tablet Take 81 mg by mouth daily.     benazepril (LOTENSIN) 20 MG tablet Take 1 tablet by mouth once daily 90 tablet 0   clotrimazole-betamethasone (LOTRISONE) cream Apply twice daily to affected area(s) for 1 week, then , as needed 45 g 0   Continuous Blood Gluc Receiver (FREESTYLE LIBRE READER) DEVI Use to check blood sugar daily dx E11.65 1 each 5   Continuous Blood Gluc Sensor (FREESTYLE LIBRE 14 DAY SENSOR) MISC USE TO CHECK BLOOD SUGAR DAILY 2 each 0   Continuous Blood Gluc Sensor (FREESTYLE LIBRE SENSOR SYSTEM) MISC Use to check blood sugar daily dx E11.65 1 each 5   glipiZIDE (GLUCOTROL XL) 5 MG 24 hr tablet Take 1 tablet by mouth once daily with breakfast 90 tablet 1   glucose blood (ONETOUCH VERIO) test strip USE 1 STRIP TO CHECK GLUCOSE THREE  TIMES DAILY AS DIRECTED DX e11.65 300 each 3   Insulin Glargine (LANTUS SOLOSTAR) 100 UNIT/ML Solostar Pen Inject 50 Units into the skin daily with breakfast. 15 mL 2   Insulin Pen Needle 31G X 8 MM MISC 1 each by Does not apply route 2 (two) times daily. 100 each 5   Lancets (FREESTYLE) lancets Use as instructed bid 100 each 5   liraglutide (VICTOZA) 18 MG/3ML SOPN Inject 0.3 mLs (1.8 mg total) into the skin every morning. INJECT 1.8 MG INTO THE SKIN DAILY AS DIRECTED 9 mL 2   montelukast (SINGULAIR) 10 MG tablet Take 1 tablet (10 mg total) by mouth at bedtime. 90 tablet 1   ONETOUCH DELICA LANCETS 98X MISC Three times daily testing dx e11.65 300 each 3   rosuvastatin (CRESTOR) 10 MG tablet Take 1 tablet (10 mg total) by mouth daily. 90 tablet 3   tadalafil (CIALIS) 20 MG tablet Take 1 tablet (20 mg total) by mouth daily as needed for erectile dysfunction. 10 tablet 3   triamterene-hydrochlorothiazide (MAXZIDE-25) 37.5-25 MG tablet Take 1 tablet by mouth once daily 90 tablet 0   No facility-administered medications prior to visit.    No Known Allergies  ROS Review of Systems  Constitutional:  Positive for chills and fever. Negative for fatigue.  HENT:  Positive for congestion and postnasal drip. Negative for sinus pressure, sinus pain and sore throat.   Respiratory:  Positive for cough. Negative for shortness of breath and wheezing.   Cardiovascular: Negative.   Neurological:  Positive for headaches.     Objective:    Physical Exam  There were no vitals taken for this visit. Wt Readings from Last 3 Encounters:  01/25/21 226 lb 12.8 oz (102.9 kg)  11/22/20 227 lb (103 kg)  07/26/20 229 lb (103.9 kg)     Health Maintenance Due  Topic Date Due   Zoster Vaccines- Shingrix (1 of 2) Never done   OPHTHALMOLOGY EXAM  07/10/2017   COVID-19 Vaccine (4 - Booster for Moderna series) 01/23/2021    There are no preventive care reminders to display for this patient.  Lab Results   Component Value Date   TSH 1.64 07/13/2020   Lab Results  Component Value Date   WBC 9.3 01/17/2021   HGB 12.4 (L) 01/17/2021   HCT 36.7 (L) 01/17/2021   MCV 91 01/17/2021   PLT 295 01/17/2021   Lab Results  Component Value Date   NA 144 01/24/2021   K 4.7 01/24/2021  CO2 23 01/24/2021   GLUCOSE 115 (H) 01/24/2021   BUN 24 01/24/2021   CREATININE 1.64 (H) 01/24/2021   BILITOT 0.3 01/24/2021   ALKPHOS 86 01/24/2021   AST 26 01/24/2021   ALT 73 (H) 01/24/2021   PROT 7.3 01/24/2021   ALBUMIN 4.2 01/24/2021   CALCIUM 9.8 01/24/2021   EGFR 47 (L) 01/24/2021   Lab Results  Component Value Date   CHOL 105 01/17/2021   Lab Results  Component Value Date   HDL 32 (L) 01/17/2021   Lab Results  Component Value Date   LDLCALC 54 01/17/2021   Lab Results  Component Value Date   TRIG 97 01/17/2021   Lab Results  Component Value Date   CHOLHDL 3.3 01/17/2021   Lab Results  Component Value Date   HGBA1C 7.8 (H) 01/17/2021      Assessment & Plan:   Problem List Items Addressed This Visit       Other   COVID-19    -home COVID test was positive, and he has been symptomatic x 3 days -Rx. molnupiravir -Rx. coricidin        No orders of the defined types were placed in this encounter.   Follow-up: Return if symptoms worsen or fail to improve.   Date:  03/29/2021   Location of Patient: Home Location of Provider: Office Consent was obtain for visit to be over via telehealth. I verified that I am speaking with the correct person using two identifiers.  I connected with  [name] on 03/29/21 via telephone and verified that I am speaking with the correct person using two identifiers.   I discussed the limitations of evaluation and management by telemedicine. The patient expressed understanding and agreed to proceed.  Time spent: 9 minutes   Noreene Larsson, NP

## 2021-04-19 ENCOUNTER — Other Ambulatory Visit: Payer: Self-pay | Admitting: Family Medicine

## 2021-04-21 ENCOUNTER — Ambulatory Visit (INDEPENDENT_AMBULATORY_CARE_PROVIDER_SITE_OTHER): Payer: PPO | Admitting: Orthopedic Surgery

## 2021-04-21 ENCOUNTER — Encounter: Payer: Self-pay | Admitting: Orthopedic Surgery

## 2021-04-21 ENCOUNTER — Other Ambulatory Visit: Payer: Self-pay

## 2021-04-21 ENCOUNTER — Ambulatory Visit: Payer: PPO | Admitting: Orthopedic Surgery

## 2021-04-21 VITALS — BP 136/78 | HR 92 | Ht 70.0 in | Wt 217.0 lb

## 2021-04-21 DIAGNOSIS — M1732 Unilateral post-traumatic osteoarthritis, left knee: Secondary | ICD-10-CM

## 2021-04-21 DIAGNOSIS — M25462 Effusion, left knee: Secondary | ICD-10-CM

## 2021-04-21 NOTE — Progress Notes (Signed)
Chief Complaint  Patient presents with   Knee Pain    Lt knee pain and swelling for 1 mo.    Encounter Diagnoses  Name Primary?   Post-traumatic osteoarthritis of left knee Yes   Effusion of knee joint, left     66 year old male with chronic knee pain and valgus osteoarthritis of the left knee presents with swelling x1 month  Patient is complaining of pain and swelling and tightness and trouble bending his knee  Examination well-developed well-nourished male neurologically intact awake alert and oriented  Left knee is swollen it is in moderate valgus  Aspiration revealed approximate 35 cc of clear yellow fluid this was followed by injection with 6 mg of Depo-Medrol and 2 cc 1% lidocaine  Patient will follow-up as needed  A steroid injection was performed after aspiration of the left knee using 1% plain Lidocaine and 6 mg of Celestone. This was well tolerated.

## 2021-04-25 ENCOUNTER — Other Ambulatory Visit: Payer: Self-pay | Admitting: Family Medicine

## 2021-05-10 ENCOUNTER — Encounter: Payer: Self-pay | Admitting: Family Medicine

## 2021-05-10 ENCOUNTER — Other Ambulatory Visit: Payer: Self-pay

## 2021-05-10 ENCOUNTER — Ambulatory Visit (INDEPENDENT_AMBULATORY_CARE_PROVIDER_SITE_OTHER): Payer: PPO | Admitting: Family Medicine

## 2021-05-10 VITALS — BP 109/73 | HR 80 | Temp 98.7°F | Resp 18 | Ht 70.0 in | Wt 216.0 lb

## 2021-05-10 DIAGNOSIS — Z794 Long term (current) use of insulin: Secondary | ICD-10-CM

## 2021-05-10 DIAGNOSIS — I10 Essential (primary) hypertension: Secondary | ICD-10-CM | POA: Diagnosis not present

## 2021-05-10 DIAGNOSIS — F321 Major depressive disorder, single episode, moderate: Secondary | ICD-10-CM

## 2021-05-10 DIAGNOSIS — M1732 Unilateral post-traumatic osteoarthritis, left knee: Secondary | ICD-10-CM | POA: Diagnosis not present

## 2021-05-10 DIAGNOSIS — E669 Obesity, unspecified: Secondary | ICD-10-CM

## 2021-05-10 DIAGNOSIS — N182 Chronic kidney disease, stage 2 (mild): Secondary | ICD-10-CM | POA: Diagnosis not present

## 2021-05-10 DIAGNOSIS — E1122 Type 2 diabetes mellitus with diabetic chronic kidney disease: Secondary | ICD-10-CM

## 2021-05-10 NOTE — Patient Instructions (Addendum)
Annual exam in 8 weeks, and re eval, also Flu vaccine at visit    CMP and EGFR later today    Thanks for choosing [facility], we consider it a privelige to serve you.

## 2021-05-13 ENCOUNTER — Encounter: Payer: Self-pay | Admitting: Family Medicine

## 2021-05-13 DIAGNOSIS — F321 Major depressive disorder, single episode, moderate: Secondary | ICD-10-CM | POA: Insufficient documentation

## 2021-05-13 NOTE — Assessment & Plan Note (Signed)
Improved.  Patient re-educated about  the importance of commitment to a  minimum of 150 minutes of exercise per week as able.  The importance of healthy food choices with portion control discussed, as well as eating regularly and within a 12 hour window most days. The need to choose "clean , green" food 50 to 75% of the time is discussed, as well as to make water the primary drink and set a goal of 64 ounces water daily.    Weight /BMI 05/10/2021 04/21/2021 01/25/2021  WEIGHT 216 lb 217 lb 226 lb 12.8 oz  HEIGHT '5\' 10"'$  '5\' 10"'$  '5\' 10"'$   BMI 30.99 kg/m2 31.14 kg/m2 32.54 kg/m2

## 2021-05-13 NOTE — Assessment & Plan Note (Signed)
Controlled, no change in medication DASH diet and commitment to daily physical activity for a minimum of 30 minutes discussed and encouraged, as a part of hypertension management. The importance of attaining a healthy weight is also discussed.  BP/Weight 05/10/2021 04/21/2021 01/25/2021 11/22/2020 07/26/2020 [id number]  Systolic BP 0000000 [id number]  Diastolic BP 73 78 69 76 68 68 82  Wt. (Lbs) 216 217 226.8 227 229 229.6 232  BMI 30.99 31.14 32.54 32.57 32.86 32.94 33.29

## 2021-05-13 NOTE — Assessment & Plan Note (Signed)
Uncontrolled and debilitating pain with reduced function , negatively impacting health, he continues to work with Ortho

## 2021-05-13 NOTE — Assessment & Plan Note (Addendum)
15 minutes spent in counseling patient and allowing him to ventilate. No formal referral to therapy or medication indicated currently he will have open discussions with his wife and children.  Follow-up in 8 weeks for reevaluation.  Symptoms are driven primarily by uncontrolled pain and loss of mobility

## 2021-05-13 NOTE — Progress Notes (Signed)
Travis Herrera     MRN: [id number]      DOB: 07-Aug-1956   HPI Travis Herrera is here for follow up and re-evaluation of chronic medical conditions, medication management and review of any available recent lab and radiology data.  Preventive health is updated, specifically  Cancer screening and Immunization.   Complains of excessive left knee pain which is debilitating.  He recently started ice packs to the knee and this has improved the pain somewhat.  He is in the office today at the request of his wife who is concerned that he may be depressed however he does not feel this to be the case at all he does feel frustrated that he is having so much pain and cannot get no relief which he will admit likely does affect his mood.  He states even though he loves his children daily he can see where sometimes he is short with them again or he feels that the uncontrolled pain is a major driving factor.  He does want to use addresses this with open discussions with his wife and children he does not feel the need for therapy  The PT denies any adverse reactions to current medications since the last visit.  ROS Denies recent fever or chills. Denies sinus pressure, nasal congestion, ear pain or sore throat. Denies chest congestion, productive cough or wheezing. Denies chest pains, palpitations and leg swelling Denies abdominal pain, nausea, vomiting,diarrhea or constipation.   Denies dysuria, frequency, hesitancy or incontinence.  Denies headaches, seizures, numbness, or tingling. Denies polyuria, polydipsia, blurred vision , or hypoglycemic episodes.  Denies skin break down or rash.   PE  BP 109/73 (BP Location: Right Arm, Patient Position: Sitting, Cuff Size: Large)   Pulse 80   Temp 98.7 F (37.1 C)   Resp 18   Ht '5\' 10"'$  (1.778 m)   Wt 216 lb (98 kg)   SpO2 98%   BMI 30.99 kg/m   Patient alert and oriented and in no cardiopulmonary distress.  HEENT: No facial asymmetry, EOMI,     Neck  supple .  Chest: Clear to auscultation bilaterally.  CVS: S1, S2 no murmurs, no S3.Regular rate.  ABD: Soft non tender.   Ext: No edema  MS: Adequate ROM spine, shoulders, hips and markedly reduced in knees.which are deformed, left worse than right  Skin: Intact, no ulcerations or rash noted.  Psych: Good eye contact, normal affect. Memory intact not anxious or depressed appearing.  CNS: CN 2-12 intact, power,  normal throughout.no focal deficits noted.   Assessment & Plan  Depression, major, single episode, moderate (HCC) 15 minutes spent in counseling patient and allowing him to ventilate. No formal referral to therapy or medication indicated currently he will have open discussions with his wife and children.  Follow-up in 8 weeks for reevaluation.  Symptoms are driven primarily by uncontrolled pain and loss of mobility  Benign hypertension Controlled, no change in medication DASH diet and commitment to daily physical activity for a minimum of 30 minutes discussed and encouraged, as a part of hypertension management. The importance of attaining a healthy weight is also discussed.  BP/Weight 05/10/2021 04/21/2021 01/25/2021 11/22/2020 07/26/2020 [id number]  Systolic BP 0000000 [id number] [id number]  Diastolic BP 73 78 69 76 68 68 82  Wt. (Lbs) 216 217 226.8 227 229 229.6 232  BMI 30.99 31.14 32.54 32.57 32.86 32.94 33.29       Obesity (BMI 30.0-34.9) Improved.  Patient re-educated  about  the importance of commitment to a  minimum of 150 minutes of exercise per week as able.  The importance of healthy food choices with portion control discussed, as well as eating regularly and within a 12 hour window most days. The need to choose "clean , green" food 50 to 75% of the time is discussed, as well as to make water the primary drink and set a goal of 64 ounces water daily.    Weight /BMI 05/10/2021 04/21/2021 01/25/2021  WEIGHT 216 lb 217 lb 226 lb 12.8 oz  HEIGHT '5\' 10"'$  5'  10" '5\' 10"'$   BMI 30.99 kg/m2 31.14 kg/m2 32.54 kg/m2      Type 2 diabetes mellitus with stage 2 chronic kidney disease, with long-term current use of insulin (HCC) Controlled, no change in medication Travis Herrera is reminded of the importance of commitment to daily physical activity for 30 minutes or more, as able and the need to limit carbohydrate intake to 30 to 60 grams per meal to help with blood sugar control.   The need to take medication as prescribed, test blood sugar as directed, and to call between visits if there is a concern that blood sugar is uncontrolled is also discussed.   Travis Herrera is reminded of the importance of daily foot exam, annual eye examination, and good blood sugar, blood pressure and cholesterol control.  Diabetic Labs Latest Ref Rng & Units 01/24/2021 01/17/2021 07/22/2020 07/13/2020 03/08/2020  HbA1c 4.8 - 5.6 % - 7.8(H) 7.0(A) - 7.0(A)  Microalbumin mg/dL - - - - -  Micro/Creat Ratio 0 - 29 mg/g creat - 12 - - -  Chol 100 - 199 mg/dL - 105 - 86 -  HDL >39 mg/dL - 32(L) - 27(L) -  Calc LDL 0 - 99 mg/dL - 54 - 36 -  Triglycerides 0 - 149 mg/dL - 97 - 150(H) -  Creatinine 0.76 - 1.27 mg/dL 1.64(H) 1.61(H) - 1.63(H) -   BP/Weight 05/10/2021 04/21/2021 01/25/2021 11/22/2020 07/26/2020 [id number]  Systolic BP 0000000 [id number]  Diastolic BP 73 78 69 76 68 68 82  Wt. (Lbs) 216 217 226.8 227 229 229.6 232  BMI 30.99 31.14 32.54 32.57 32.86 32.94 33.29   Foot/eye exam completion dates Latest Ref Rng & Units 05/19/2020 03/12/2019  Eye Exam No Retinopathy - -  Foot Form Completion - Done Done        Osteoarthritis of left knee Uncontrolled and debilitating pain with reduced function , negatively impacting health, he continues to work with Ortho

## 2021-05-13 NOTE — Assessment & Plan Note (Signed)
Controlled, no change in medication Mr. Quattrochi is reminded of the importance of commitment to daily physical activity for 30 minutes or more, as able and the need to limit carbohydrate intake to 30 to 60 grams per meal to help with blood sugar control.   The need to take medication as prescribed, test blood sugar as directed, and to call between visits if there is a concern that blood sugar is uncontrolled is also discussed.   Mr. Nudelman is reminded of the importance of daily foot exam, annual eye examination, and good blood sugar, blood pressure and cholesterol control.  Diabetic Labs Latest Ref Rng & Units 01/24/2021 01/17/2021 07/22/2020 07/13/2020 03/08/2020  HbA1c 4.8 - 5.6 % - 7.8(H) 7.0(A) - 7.0(A)  Microalbumin mg/dL - - - - -  Micro/Creat Ratio 0 - 29 mg/g creat - 12 - - -  Chol 100 - 199 mg/dL - 105 - 86 -  HDL >39 mg/dL - 32(L) - 27(L) -  Calc LDL 0 - 99 mg/dL - 54 - 36 -  Triglycerides 0 - 149 mg/dL - 97 - 150(H) -  Creatinine 0.76 - 1.27 mg/dL 1.64(H) 1.61(H) - 1.63(H) -   BP/Weight 05/10/2021 04/21/2021 01/25/2021 11/22/2020 07/26/2020 [id number]  Systolic BP 0000000 [id number] [id number]  Diastolic BP 73 78 69 76 68 68 82  Wt. (Lbs) 216 217 226.8 227 229 229.6 232  BMI 30.99 31.14 32.54 32.57 32.86 32.94 33.29   Foot/eye exam completion dates Latest Ref Rng & Units 05/19/2020 03/12/2019  Eye Exam No Retinopathy - -  Foot Form Completion - Done Done

## 2021-05-19 ENCOUNTER — Other Ambulatory Visit: Payer: Self-pay | Admitting: Family Medicine

## 2021-05-27 ENCOUNTER — Ambulatory Visit (INDEPENDENT_AMBULATORY_CARE_PROVIDER_SITE_OTHER): Payer: PPO | Admitting: Nurse Practitioner

## 2021-05-27 ENCOUNTER — Encounter: Payer: Self-pay | Admitting: Nurse Practitioner

## 2021-05-27 VITALS — BP 111/79 | HR 75 | Ht 70.0 in | Wt 216.0 lb

## 2021-05-27 DIAGNOSIS — E1122 Type 2 diabetes mellitus with diabetic chronic kidney disease: Secondary | ICD-10-CM

## 2021-05-27 DIAGNOSIS — I10 Essential (primary) hypertension: Secondary | ICD-10-CM | POA: Diagnosis not present

## 2021-05-27 DIAGNOSIS — Z794 Long term (current) use of insulin: Secondary | ICD-10-CM

## 2021-05-27 DIAGNOSIS — N1831 Chronic kidney disease, stage 3a: Secondary | ICD-10-CM

## 2021-05-27 DIAGNOSIS — E782 Mixed hyperlipidemia: Secondary | ICD-10-CM

## 2021-05-27 LAB — POCT GLYCOSYLATED HEMOGLOBIN (HGB A1C): Hemoglobin A1C: 7.4 % — AB (ref 4.0–5.6)

## 2021-05-27 NOTE — Patient Instructions (Signed)
Diabetes Mellitus and Nutrition, Adult When you have diabetes, or diabetes mellitus, it is very important to have healthy eating habits because your blood sugar (glucose) levels are greatly affected by what you eat and drink. Eating healthy foods in the right amounts, at about the same times every day, can help you:  Control your blood glucose.  Lower your risk of heart disease.  Improve your blood pressure.  Reach or maintain a healthy weight. What can affect my meal plan? Every person with diabetes is different, and each person has different needs for a meal plan. Your health care provider may recommend that you work with a dietitian to make a meal plan that is best for you. Your meal plan may vary depending on factors such as:  The calories you need.  The medicines you take.  Your weight.  Your blood glucose, blood pressure, and cholesterol levels.  Your activity level.  Other health conditions you have, such as heart or kidney disease. How do carbohydrates affect me? Carbohydrates, also called carbs, affect your blood glucose level more than any other type of food. Eating carbs naturally raises the amount of glucose in your blood. Carb counting is a method for keeping track of how many carbs you eat. Counting carbs is important to keep your blood glucose at a healthy level, especially if you use insulin or take certain oral diabetes medicines. It is important to know how many carbs you can safely have in each meal. This is different for every person. Your dietitian can help you calculate how many carbs you should have at each meal and for each snack. How does alcohol affect me? Alcohol can cause a sudden decrease in blood glucose (hypoglycemia), especially if you use insulin or take certain oral diabetes medicines. Hypoglycemia can be a life-threatening condition. Symptoms of hypoglycemia, such as sleepiness, dizziness, and confusion, are similar to symptoms of having too much  alcohol.  Do not drink alcohol if: ? Your health care provider tells you not to drink. ? You are pregnant, may be pregnant, or are planning to become pregnant.  If you drink alcohol: ? Do not drink on an empty stomach. ? Limit how much you use to:  0-1 drink a day for women.  0-2 drinks a day for men. ? Be aware of how much alcohol is in your drink. In the [place] one drink equals one 12 oz bottle of beer (355 mL), one 5 oz glass of wine (148 mL), or one 1 oz glass of hard liquor (44 mL). ? Keep yourself hydrated with water, diet soda, or unsweetened iced tea.  Keep in mind that regular soda, juice, and other mixers may contain a lot of sugar and must be counted as carbs. What are tips for following this plan? Reading food labels  Start by checking the serving size on the "Nutrition Facts" label of packaged foods and drinks. The amount of calories, carbs, fats, and other nutrients listed on the label is based on one serving of the item. Many items contain more than one serving per package.  Check the total grams (g) of carbs in one serving. You can calculate the number of servings of carbs in one serving by dividing the total carbs by 15. For example, if a food has 30 g of total carbs per serving, it would be equal to 2 servings of carbs.  Check the number of grams (g) of saturated fats and trans fats in one serving. Choose foods that have   a low amount or none of these fats.  Check the number of milligrams (mg) of salt (sodium) in one serving. Most people should limit total sodium intake to less than 2,300 mg per day.  Always check the nutrition information of foods labeled as "low-fat" or "nonfat." These foods may be higher in added sugar or refined carbs and should be avoided.  Talk to your dietitian to identify your daily goals for nutrients listed on the label. Shopping  Avoid buying canned, pre-made, or processed foods. These foods tend to be high in fat, sodium, and added  sugar.  Shop around the outside edge of the grocery store. This is where you will most often find fresh fruits and vegetables, bulk grains, fresh meats, and fresh dairy. Cooking  Use low-heat cooking methods, such as baking, instead of high-heat cooking methods like deep frying.  Cook using healthy oils, such as olive, canola, or sunflower oil.  Avoid cooking with butter, cream, or high-fat meats. Meal planning  Eat meals and snacks regularly, preferably at the same times every day. Avoid going long periods of time without eating.  Eat foods that are high in fiber, such as fresh fruits, vegetables, beans, and whole grains. Talk with your dietitian about how many servings of carbs you can eat at each meal.  Eat 4-6 oz (112-168 g) of lean protein each day, such as lean meat, chicken, fish, eggs, or tofu. One ounce (oz) of lean protein is equal to: ? 1 oz (28 g) of meat, chicken, or fish. ? 1 egg. ?  cup (62 g) of tofu.  Eat some foods each day that contain healthy fats, such as avocado, nuts, seeds, and fish.   What foods should I eat? Fruits Berries. Apples. Oranges. Peaches. Apricots. Plums. Grapes. Mango. Papaya. Pomegranate. Kiwi. Cherries. Vegetables Lettuce. Spinach. Leafy greens, including kale, chard, collard greens, and mustard greens. Beets. Cauliflower. Cabbage. Broccoli. Carrots. Green beans. Tomatoes. Peppers. Onions. Cucumbers. Brussels sprouts. Grains Whole grains, such as whole-wheat or whole-grain bread, crackers, tortillas, cereal, and pasta. Unsweetened oatmeal. Quinoa. Brown or wild rice. Meats and other proteins Seafood. Poultry without skin. Lean cuts of poultry and beef. Tofu. Nuts. Seeds. Dairy Low-fat or fat-free dairy products such as milk, yogurt, and cheese. The items listed above may not be a complete list of foods and beverages you can eat. Contact a dietitian for more information. What foods should I avoid? Fruits Fruits canned with  syrup. Vegetables Canned vegetables. Frozen vegetables with butter or cream sauce. Grains Refined white flour and flour products such as bread, pasta, snack foods, and cereals. Avoid all processed foods. Meats and other proteins Fatty cuts of meat. Poultry with skin. Breaded or fried meats. Processed meat. Avoid saturated fats. Dairy Full-fat yogurt, cheese, or milk. Beverages Sweetened drinks, such as soda or iced tea. The items listed above may not be a complete list of foods and beverages you should avoid. Contact a dietitian for more information. Questions to ask a health care provider  Do I need to meet with a diabetes educator?  Do I need to meet with a dietitian?  What number can I call if I have questions?  When are the best times to check my blood glucose? Where to find more information:  American Diabetes Association: diabetes.org  Academy of Nutrition and Dietetics: www.eatright.org  National Institute of Diabetes and Digestive and Kidney Diseases: www.niddk.nih.gov  Association of Diabetes Care and Education Specialists: www.diabeteseducator.org Summary  It is important to have healthy eating   habits because your blood sugar (glucose) levels are greatly affected by what you eat and drink.  A healthy meal plan will help you control your blood glucose and maintain a healthy lifestyle.  Your health care provider may recommend that you work with a dietitian to make a meal plan that is best for you.  Keep in mind that carbohydrates (carbs) and alcohol have immediate effects on your blood glucose levels. It is important to count carbs and to use alcohol carefully. This information is not intended to replace advice given to you by your health care provider. Make sure you discuss any questions you have with your health care provider. Document Revised: 09/09/2019 Document Reviewed: 09/09/2019 Elsevier Patient Education  2021 Elsevier Inc.  

## 2021-05-27 NOTE — Progress Notes (Signed)
05/27/2021  Endocrinology follow-up note   Subjective:    Patient ID: Travis Herrera, male    DOB: May 31, 1956,    Past Medical History:  Diagnosis Date   Diabetes mellitus without complication ([place]) [id number]   insulin started in 2012   Hyperlipemia 2013   Hypertension 2010   Past Surgical History:  Procedure Laterality Date   CATARACT EXTRACTION W/PHACO  07/29/2012   Procedure: CATARACT EXTRACTION PHACO AND INTRAOCULAR LENS PLACEMENT ([place]);  Surgeon: Tonny Branch, MD;  Location: [facility];  Service: Ophthalmology;  Laterality: Left;  CDE=1.66   CHOLECYSTECTOMY  2007   [facility]   EYE SURGERY Left 2013   cataract   QUADRICEPS TENDON REPAIR Right 03/19/2018   Procedure: REPAIR QUADRICEP TENDON;  Surgeon: Carole Civil, MD;  Location: [facility];  Service: Orthopedics;  Laterality: Right;   Social History   Socioeconomic History   Marital status: Married    Spouse name: Not on file   Number of children: 4   Years of education: Not on file   Highest education level: Not on file  Occupational History   Not on file  Tobacco Use   Smoking status: Never   Smokeless tobacco: Never  Vaping Use   Vaping Use: Never used  Substance and Sexual Activity   Alcohol use: No   Drug use: No   Sexual activity: Yes    Birth control/protection: None  Other Topics Concern   Not on file  Social History Narrative   Not on file   Social Determinants of Health   Financial Resource Strain: Low Risk    Difficulty of Paying Living Expenses: Not hard at all  Food Insecurity: No Food Insecurity   Worried About Charity fundraiser in the Last Year: Never true   [place] in the Last Year: Never true  Transportation Needs: No Transportation Needs   Lack of Transportation (Medical): No   Lack of Transportation (Non-Medical): No  Physical Activity: Sufficiently Active   Days of Exercise per Week: 5 days   Minutes of Exercise per Session: 30 min  Stress: No Stress Concern Present    Feeling of Stress : Not at all  Social Connections: Socially Integrated   Frequency of Communication with Friends and Family: More than three times a week   Frequency of Social Gatherings with Friends and Family: Once a week   Attends Religious Services: More than 4 times per year   Active Member of [employer] or Organizations: Yes   Attends Archivist Meetings: 1 to 4 times per year   Marital Status: Married   Outpatient Encounter Medications as of 05/27/2021  Medication Sig   amLODipine (NORVASC) 10 MG tablet Take 1 tablet by mouth once daily   aspirin EC 81 MG tablet Take 81 mg by mouth daily.   benazepril (LOTENSIN) 20 MG tablet Take 1 tablet by mouth once daily   Chlorpheniramine-DM (CORICIDIN COUGH/COLD) 4-30 MG TABS Take 1 tablet by mouth every 6 (six) hours as needed.   clotrimazole-betamethasone (LOTRISONE) cream Apply twice daily to affected area(s) for 1 week, then , as needed   Continuous Blood Gluc Receiver (FREESTYLE LIBRE READER) DEVI Use to check blood sugar daily dx E11.65   Continuous Blood Gluc Sensor (FREESTYLE LIBRE 14 DAY SENSOR) MISC USE TO CHECK BLOOD SUGAR DAILY   Continuous Blood Gluc Sensor (FREESTYLE LIBRE SENSOR SYSTEM) MISC Use to check blood sugar daily dx E11.65   glipiZIDE (GLUCOTROL XL) 5 MG  24 hr tablet Take 1 tablet by mouth once daily with breakfast   glucose blood (ONETOUCH VERIO) test strip USE 1 STRIP TO CHECK GLUCOSE THREE TIMES DAILY AS DIRECTED DX e11.65   Insulin Glargine (LANTUS SOLOSTAR) 100 UNIT/ML Solostar Pen Inject 50 Units into the skin daily with breakfast.   Insulin Pen Needle 31G X 8 MM MISC 1 each by Does not apply route 2 (two) times daily.   Lancets (FREESTYLE) lancets Use as instructed bid   liraglutide (VICTOZA) 18 MG/3ML SOPN Inject 0.3 mLs (1.8 mg total) into the skin every morning. INJECT 1.8 MG INTO THE SKIN DAILY AS DIRECTED   montelukast (SINGULAIR) 10 MG tablet TAKE 1 TABLET BY MOUTH AT BEDTIME   ONETOUCH DELICA LANCETS  99991111 MISC Three times daily testing dx e11.65   rosuvastatin (CRESTOR) 10 MG tablet Take 1 tablet by mouth once daily   tadalafil (CIALIS) 20 MG tablet Take 1 tablet (20 mg total) by mouth daily as needed for erectile dysfunction.   triamterene-hydrochlorothiazide (MAXZIDE-25) 37.5-25 MG tablet Take 1 tablet by mouth once daily   No facility-administered encounter medications on file as of 05/27/2021.   ALLERGIES: No Known Allergies VACCINATION STATUS: Immunization History  Administered Date(s) Administered   Influenza Split 07/17/2014   Influenza,inj,Quad PF,6+ Mos 08/16/2015, 07/26/2016, 08/02/2017, 07/19/2018, 06/16/2019, 07/02/2020   Moderna Sars-Covid-2 Vaccination 12/27/2019, 01/28/2020   PFIZER(Purple Top)SARS-COV-2 Vaccination 09/24/2020   Pneumococcal Polysaccharide-23 09/15/2019    Diabetes He presents for his follow-up diabetic visit. He has type 2 diabetes mellitus. Onset time: He was diagnosed at approximate age of 22 years. His disease course has been improving. There are no hypoglycemic associated symptoms. Pertinent negatives for hypoglycemia include no confusion, headaches, pallor or seizures. Pertinent negatives for diabetes include no chest pain, no fatigue, no polydipsia, no polyphagia, no polyuria and no weakness. There are no hypoglycemic complications. Symptoms are stable. Diabetic complications include nephropathy. Risk factors for coronary artery disease include diabetes mellitus, dyslipidemia, hypertension, family history, male sex, sedentary lifestyle and obesity. Current diabetic treatment includes insulin injections and oral agent (monotherapy). He is compliant with treatment most of the time. His weight is fluctuating minimally. He is following a generally healthy diet. When asked about meal planning, he reported none. He has not had a previous visit with a dietitian. He rarely participates in exercise. His home blood glucose trend is fluctuating minimally. His  breakfast blood glucose range is generally 90-110 mg/dl. His lunch blood glucose range is generally 180-200 mg/dl. His dinner blood glucose range is generally 140-180 mg/dl. His bedtime blood glucose range is generally 130-140 mg/dl. (He presents today with his CGM and logs showing improving glycemic profile.  His POCT A1c today is 7.4%, improving from last visit of 7.8%.  He was shocked by this improvement as he had a cortisone shot in his left knee since last visit which caused elevation in his glucose readings.  He denies any significant hypoglycemia.  Analysis of his CGM shows TIR 66%, TAR 34%, TBR 0%.) An ACE inhibitor/angiotensin II receptor blocker is being taken. He does not see a podiatrist.Eye exam is current.  Hypertension This is a chronic problem. The current episode started more than 1 year ago. The problem has been gradually improving since onset. The problem is controlled. Pertinent negatives include no chest pain, headaches, neck pain, palpitations or shortness of breath. There are no associated agents to hypertension. Risk factors for coronary artery disease include dyslipidemia, diabetes mellitus, male gender, obesity and sedentary lifestyle. Past treatments  include ACE inhibitors, diuretics and calcium channel blockers. The current treatment provides moderate improvement. There are no compliance problems.  Hypertensive end-organ damage includes kidney disease. Identifiable causes of hypertension include chronic renal disease.  Hyperlipidemia This is a chronic problem. The current episode started more than 1 year ago. The problem is controlled. Recent lipid tests were reviewed and are normal. Exacerbating diseases include chronic renal disease, diabetes and obesity. Factors aggravating his hyperlipidemia include fatty foods. Pertinent negatives include no chest pain, myalgias or shortness of breath. Current antihyperlipidemic treatment includes statins. The current treatment provides moderate  improvement of lipids. Compliance problems include adherence to diet and adherence to exercise.  Risk factors for coronary artery disease include dyslipidemia, diabetes mellitus, hypertension, male sex, a sedentary lifestyle and obesity.     Review of systems  Constitutional: + Minimally fluctuating body weight,  current Body mass index is 30.99 kg/m. , no fatigue, no subjective hyperthermia, no subjective hypothermia Eyes: no blurry vision, no xerophthalmia ENT: no sore throat, no nodules palpated in throat, no dysphagia/odynophagia, no hoarseness Cardiovascular: no chest pain, no shortness of breath, no palpitations, no leg swelling Respiratory: no cough, no shortness of breath Gastrointestinal: no nausea/vomiting/diarrhea Musculoskeletal: L knee pain with associated swelling- has had cortisone shot with some relief Skin: no rashes, no hyperemia Neurological: no tremors, no numbness, no tingling, no dizziness Psychiatric: no depression, no anxiety    Objective:    BP 111/79   Pulse 75   Ht '5\' 10"'$  (1.778 m)   Wt 216 lb (98 kg)   BMI 30.99 kg/m   Wt Readings from Last 3 Encounters:  05/27/21 216 lb (98 kg)  05/10/21 216 lb (98 kg)  04/21/21 217 lb (98.4 kg)    BP Readings from Last 3 Encounters:  05/27/21 111/79  05/10/21 109/73  04/21/21 136/78     Physical Exam- Limited  Constitutional:  Body mass index is 30.99 kg/m. , not in acute distress, normal state of mind Eyes:  EOMI, no exophthalmos Neck: Supple Cardiovascular: RRR, no murmurs, rubs, or gallops, no edema Respiratory: Adequate breathing efforts, no crackles, rales, rhonchi, or wheezing Musculoskeletal: no gross deformities, strength intact in all four extremities, no gross restriction of joint movements Skin:  no rashes, no hyperemia Neurological: no tremor with outstretched hands    Diabetic Foot Exam - Simple   Simple Foot Form Diabetic Foot exam was performed with the following findings: Yes  05/27/2021  8:37 AM  Visual Inspection No deformities, no ulcerations, no other skin breakdown bilaterally: Yes Sensation Testing Intact to touch and monofilament testing bilaterally: Yes Pulse Check Posterior Tibialis and Dorsalis pulse intact bilaterally: Yes Comments       Results for orders placed or performed in visit on 05/27/21  HgB A1c  Result Value Ref Range   Hemoglobin A1C 7.4 (A) 4.0 - 5.6 %   HbA1c POC (<> result, manual entry)     HbA1c, POC (prediabetic range)     HbA1c, POC (controlled diabetic range)     Diabetic Labs (most recent): Lab Results  Component Value Date   HGBA1C 7.4 (A) 05/27/2021   HGBA1C 7.8 (H) 01/17/2021   HGBA1C 7.0 (A) 07/22/2020   Lipid Panel     Component Value Date/Time   CHOL 105 01/17/2021 0830   TRIG 97 01/17/2021 0830   HDL 32 (L) 01/17/2021 0830   CHOLHDL 3.3 01/17/2021 0830   CHOLHDL 3.2 07/13/2020 0840   VLDL 12 01/16/2017 0940   LDLCALC 54 01/17/2021 0830  [place] 36 07/13/2020 0840      Assessment & Plan:   1) Diabetes mellitus type 2 without complications, with long term insulin use ([place])  He presents today with his CGM and logs showing improving glycemic profile.  His POCT A1c today is 7.4%, improving from last visit of 7.8%.  He was shocked by this improvement as he had a cortisone shot in his left knee since last visit which caused elevation in his glucose readings.  He denies any significant hypoglycemia.  Analysis of his CGM shows TIR 66%, TAR 34%, TBR 0%.    Recent labs reviewed, showing mild renal insufficiency (stable) and improving liver function tests.  - Patient remains at a high risk for more acute and chronic complications of diabetes which include CAD, CVA, CKD, retinopathy, and neuropathy. These are all discussed in detail with the patient.  - Nutritional counseling repeated at each appointment due to patients tendency to fall back in to old habits.  - The patient admits there is a room for  improvement in their diet and drink choices. -  Suggestion is made for the patient to avoid simple carbohydrates from their diet including Cakes, Sweet Desserts / Pastries, Ice Cream, Soda (diet and regular), Sweet Tea, Candies, Chips, Cookies, Sweet Pastries, Store Bought Juices, Alcohol in Excess of 1-2 drinks a day, Artificial Sweeteners, Coffee Creamer, and "Sugar-free" Products. This will help patient to have stable blood glucose profile and potentially avoid unintended weight gain.   - I encouraged the patient to switch to unprocessed or minimally processed complex starch and increased protein intake (animal or plant source), fruits, and vegetables.   - Patient is advised to stick to a routine mealtimes to eat 3 meals a day and avoid unnecessary snacks (to snack only to correct hypoglycemia).  - I have approached patient with the following individualized plan to manage diabetes and patient agrees.  Given his stable glycemic profile without hypoglycemia, he is advised to continue with same current medication regimen as follows:  -Lantus 50 units SQ daily at breakfast -Victoza 1.8 SQ daily -Glipizide 5 mg XL daily with breakfast  -He is advised to continue monitoring blood glucose at least twice daily using his CGM, before breakfast and before bed, and call the clinic if he gets readings less than 70 or greater than 200 for 3 tests in a row.  - Patient specific target  for A1c; LDL, HDL, Triglycerides, and  Waist Circumference were discussed in detail.  2) BP/HTN: His blood pressure is controlled to target.  He is advised to continue Amlodipine 10 mg po daily, Benazepril 20 mg po daily, and Triamterene-HCT 37.5-25 mg po daily.  3) Lipids/HPL: Most recent lipid panel from 01/17/21 shows controlled LDL at 54.  He is advised to continue Crestor 10 mg po daily at bedtime.  Side effects and precautions discussed with him.  He is advised to avoid fried foods and butter.   4)  Weight/Diet:  His  Body mass index is 30.99 kg/m.-is a candidate for modest weight loss.  CDE consult in progress, exercise, and carbohydrates information provided.  5) Chronic Care/Health Maintenance: -Patient on ACEI and Statin medications and encouraged to continue to follow up with Ophthalmology, Podiatrist at least yearly or according to recommendations, and advised to stay away from smoking. I have recommended yearly flu vaccine and pneumonia vaccination at least every 5 years; moderate intensity exercise for up to 150 minutes weekly; and  sleep for at least 7 hours a day.  I advised patient to maintain close follow up with his PCP for primary care needs.      I spent 30 minutes in the care of the patient today including review of labs from [place], Lipids, Thyroid Function, Hematology (current and previous including abstractions from other facilities); face-to-face time discussing  his blood glucose readings/logs, discussing hypoglycemia and hyperglycemia episodes and symptoms, medications doses, his options of short and long term treatment based on the latest standards of care / guidelines;  discussion about incorporating lifestyle medicine;  and documenting the encounter.    Please refer to Patient Instructions for Blood Glucose Monitoring and Insulin/Medications Dosing Guide"  in media tab for additional information. Please  also refer to " Patient Self Inventory" in the Media  tab for reviewed elements of pertinent patient history.  Travis Herrera participated in the discussions, expressed understanding, and voiced agreement with the above plans.  All questions were answered to his satisfaction. he is encouraged to contact clinic should he have any questions or concerns prior to his return visit.    Follow up plan: Return in about 4 months (around 09/26/2021) for Diabetes F/U with A1c in office, No previsit labs, Bring meter and logs.    Rayetta Pigg, [facility] [address] [place],  [address] Phone: [phone] Fax: [phone]  05/27/2021, 8:49 AM

## 2021-05-31 DIAGNOSIS — M1712 Unilateral primary osteoarthritis, left knee: Secondary | ICD-10-CM | POA: Diagnosis not present

## 2021-06-07 DIAGNOSIS — M1712 Unilateral primary osteoarthritis, left knee: Secondary | ICD-10-CM | POA: Diagnosis not present

## 2021-06-14 DIAGNOSIS — M1712 Unilateral primary osteoarthritis, left knee: Secondary | ICD-10-CM | POA: Diagnosis not present

## 2021-06-29 ENCOUNTER — Other Ambulatory Visit: Payer: Self-pay | Admitting: Family Medicine

## 2021-07-06 ENCOUNTER — Other Ambulatory Visit: Payer: Self-pay

## 2021-07-06 DIAGNOSIS — N182 Chronic kidney disease, stage 2 (mild): Secondary | ICD-10-CM | POA: Diagnosis not present

## 2021-07-06 DIAGNOSIS — E1122 Type 2 diabetes mellitus with diabetic chronic kidney disease: Secondary | ICD-10-CM | POA: Diagnosis not present

## 2021-07-06 DIAGNOSIS — Z794 Long term (current) use of insulin: Secondary | ICD-10-CM | POA: Diagnosis not present

## 2021-07-07 LAB — CMP14+EGFR
ALT: 27 IU/L (ref 0–44)
AST: 23 IU/L (ref 0–40)
Albumin/Globulin Ratio: 2 (ref 1.2–2.2)
Albumin: 4.3 g/dL (ref 3.8–4.8)
Alkaline Phosphatase: 86 IU/L (ref 44–121)
BUN/Creatinine Ratio: 15 (ref 10–24)
BUN: 23 mg/dL (ref 8–27)
Bilirubin Total: 0.3 mg/dL (ref 0.0–1.2)
CO2: 23 mmol/L (ref 20–29)
Calcium: 9.2 mg/dL (ref 8.6–10.2)
Chloride: 102 mmol/L (ref 96–106)
Creatinine, Ser: 1.54 mg/dL — ABNORMAL HIGH (ref 0.76–1.27)
Globulin, Total: 2.2 g/dL (ref 1.5–4.5)
Glucose: 231 mg/dL — ABNORMAL HIGH (ref 65–99)
Potassium: 4.4 mmol/L (ref 3.5–5.2)
Sodium: 139 mmol/L (ref 134–144)
Total Protein: 6.5 g/dL (ref 6.0–8.5)
eGFR: 51 mL/min/{1.73_m2} — ABNORMAL LOW (ref >59)

## 2021-07-15 ENCOUNTER — Other Ambulatory Visit: Payer: Self-pay | Admitting: Family Medicine

## 2021-07-19 ENCOUNTER — Telehealth: Payer: Self-pay | Admitting: Family Medicine

## 2021-07-19 ENCOUNTER — Other Ambulatory Visit: Payer: Self-pay

## 2021-07-19 MED ORDER — FREESTYLE LIBRE READER DEVI: 5 refills | Status: DC

## 2021-07-19 MED ORDER — FREESTYLE LIBRE 14 DAY SENSOR MISC: 5 refills | Status: DC

## 2021-07-19 NOTE — Telephone Encounter (Signed)
Pt is out of the free style libra--and needs a refill --please send to [facility]

## 2021-07-19 NOTE — Telephone Encounter (Signed)
Refill sent.

## 2021-07-19 NOTE — Telephone Encounter (Signed)
Patient called in and needs refill on [facility]

## 2021-07-22 ENCOUNTER — Other Ambulatory Visit: Payer: Self-pay | Admitting: Family Medicine

## 2021-07-25 ENCOUNTER — Other Ambulatory Visit: Payer: Self-pay | Admitting: Family Medicine

## 2021-07-28 ENCOUNTER — Telehealth: Payer: Self-pay

## 2021-07-28 ENCOUNTER — Other Ambulatory Visit: Payer: Self-pay

## 2021-07-28 MED ORDER — FREESTYLE LIBRE 14 DAY SENSOR MISC: 5 refills | Status: DC

## 2021-07-28 MED ORDER — TRIAMTERENE-HCTZ 37.5-25 MG PO TABS: 1.0000 | ORAL_TABLET | Freq: Every day | ORAL | 0 refills | Status: DC

## 2021-07-28 MED ORDER — BENAZEPRIL HCL 20 MG PO TABS: 20.0000 mg | ORAL_TABLET | Freq: Every day | ORAL | 0 refills | Status: DC

## 2021-07-28 MED ORDER — AMLODIPINE BESYLATE 10 MG PO TABS: 10.0000 mg | ORAL_TABLET | Freq: Every day | ORAL | 0 refills | Status: DC

## 2021-07-28 MED ORDER — FREESTYLE LIBRE READER DEVI: 5 refills | Status: DC

## 2021-07-28 MED ORDER — ROSUVASTATIN CALCIUM 10 MG PO TABS: 10.0000 mg | ORAL_TABLET | Freq: Every day | ORAL | 1 refills | Status: DC

## 2021-07-28 MED ORDER — MONTELUKAST SODIUM 10 MG PO TABS: 10.0000 mg | ORAL_TABLET | Freq: Every day | ORAL | 0 refills | Status: DC

## 2021-07-28 NOTE — Telephone Encounter (Signed)
Refills have been sent.  

## 2021-07-28 NOTE — Telephone Encounter (Signed)
Patient called need med refills  rosuvastatin (CRESTOR) 10 MG tablet  montelukast (SINGULAIR) 10 MG tablet  Continuous Blood Gluc Receiver (FREESTYLE LIBRE READER) DEVI   Continuous Blood Gluc Sensor (FREESTYLE LIBRE 14 DAY SENSOR) MISC [[id number]]  benazepril (LOTENSIN) 20 MG tablet [[id number]]   triamterene-hydrochlorothiazide (MAXZIDE-25) 37.5-25 MG tablet [[id number]]  amLODipine (NORVASC) 10 MG tablet [[id number]]   Pharmacy: [facility]

## 2021-07-29 DIAGNOSIS — H43813 Vitreous degeneration, bilateral: Secondary | ICD-10-CM | POA: Diagnosis not present

## 2021-08-01 ENCOUNTER — Encounter: Payer: PPO | Admitting: Family Medicine

## 2021-08-12 DIAGNOSIS — H43813 Vitreous degeneration, bilateral: Secondary | ICD-10-CM | POA: Diagnosis not present

## 2021-08-12 DIAGNOSIS — H25811 Combined forms of age-related cataract, right eye: Secondary | ICD-10-CM | POA: Diagnosis not present

## 2021-08-15 ENCOUNTER — Other Ambulatory Visit: Payer: Self-pay | Admitting: Nurse Practitioner

## 2021-08-15 ENCOUNTER — Other Ambulatory Visit: Payer: Self-pay | Admitting: Family Medicine

## 2021-08-16 ENCOUNTER — Ambulatory Visit (INDEPENDENT_AMBULATORY_CARE_PROVIDER_SITE_OTHER): Payer: PPO

## 2021-08-16 ENCOUNTER — Encounter: Payer: Self-pay | Admitting: Family Medicine

## 2021-08-16 ENCOUNTER — Other Ambulatory Visit: Payer: Self-pay

## 2021-08-16 DIAGNOSIS — Z0000 Encounter for general adult medical examination without abnormal findings: Secondary | ICD-10-CM

## 2021-08-16 NOTE — Patient Instructions (Signed)
[name] , Thank you for taking time to come for your Medicare Wellness Visit. I appreciate your ongoing commitment to your health goals. Please review the following plan we discussed and let me know if I can assist you in the future.   Screening recommendations/referrals: Colonoscopy: done Mammogram: n/a Bone Density: n/a Recommended yearly ophthalmology/optometry visit for glaucoma screening and checkup Recommended yearly dental visit for hygiene and checkup  Vaccinations: Influenza vaccine: due now  Pneumococcal vaccine: due now  Tdap vaccine: due now  Shingles vaccine: due now       Advanced directives: yes  Conditions/risks identified: hypertension  Next appointment: 1 year    Preventive Care 67 Years and Older, Male Preventive care refers to lifestyle choices and visits with your health care provider that can promote health and wellness. What does preventive care include? A yearly physical exam. This is also called an annual well check. Dental exams once or twice a year. Routine eye exams. Ask your health care provider how often you should have your eyes checked. Personal lifestyle choices, including: Daily care of your teeth and gums. Regular physical activity. Eating a healthy diet. Avoiding tobacco and drug use. Limiting alcohol use. Practicing safe sex. Taking low-dose aspirin every day. Taking vitamin and mineral supplements as recommended by your health care provider. What happens during an annual well check? The services and screenings done by your health care provider during your annual well check will depend on your age, overall health, lifestyle risk factors, and family history of disease. Counseling  Your health care provider may ask you questions about your: Alcohol use. Tobacco use. Drug use. Emotional well-being. Home and relationship well-being. Sexual activity. Eating habits. History of falls. Memory and ability to understand  (cognition). Work and work Statistician. Reproductive health. Screening  You may have the following tests or measurements: Height, weight, and BMI. Blood pressure. Lipid and cholesterol levels. These may be checked every 5 years, or more frequently if you are over 32 years old. Skin check. Lung cancer screening. You may have this screening every year starting at age 75 if you have a 30-pack-year history of smoking and currently smoke or have quit within the past 15 years. Fecal occult blood test (FOBT) of the stool. You may have this test every year starting at age 53. Flexible sigmoidoscopy or colonoscopy. You may have a sigmoidoscopy every 5 years or a colonoscopy every 10 years starting at age 62. Hepatitis C blood test. Hepatitis B blood test. Sexually transmitted disease (STD) testing. Diabetes screening. This is done by checking your blood sugar (glucose) after you have not eaten for a while (fasting). You may have this done every 1-3 years. Bone density scan. This is done to screen for osteoporosis. You may have this done starting at age 23. Mammogram. This may be done every 1-2 years. Talk to your health care provider about how often you should have regular mammograms. Talk with your health care provider about your test results, treatment options, and if necessary, the need for more tests. Vaccines  Your health care provider may recommend certain vaccines, such as: Influenza vaccine. This is recommended every year. Tetanus, diphtheria, and acellular pertussis (Tdap, Td) vaccine. You may need a Td booster every 10 years. Zoster vaccine. You may need this after age 63. Pneumococcal 13-valent conjugate (PCV13) vaccine. One dose is recommended after age 16. Pneumococcal polysaccharide (PPSV23) vaccine. One dose is recommended after age 18. Talk to your health care provider about which screenings and vaccines  you need and how often you need them. This information is not intended to  replace advice given to you by your health care provider. Make sure you discuss any questions you have with your health care provider. Document Released: 10/29/2015 Document Revised: 06/21/2016 Document Reviewed: 08/03/2015 Elsevier Interactive Patient Education  2017 [employer] Prevention in the Home Falls can cause injuries. They can happen to people of all ages. There are many things you can do to make your home safe and to help prevent falls. What can I do on the outside of my home? Regularly fix the edges of walkways and driveways and fix any cracks. Remove anything that might make you trip as you walk through a door, such as a raised step or threshold. Trim any bushes or trees on the path to your home. Use bright outdoor lighting. Clear any walking paths of anything that might make someone trip, such as rocks or tools. Regularly check to see if handrails are loose or broken. Make sure that both sides of any steps have handrails. Any raised decks and porches should have guardrails on the edges. Have any leaves, snow, or ice cleared regularly. Use sand or salt on walking paths during winter. Clean up any spills in your garage right away. This includes oil or grease spills. What can I do in the bathroom? Use night lights. Install grab bars by the toilet and in the tub and shower. Do not use towel bars as grab bars. Use non-skid mats or decals in the tub or shower. If you need to sit down in the shower, use a plastic, non-slip stool. Keep the floor dry. Clean up any water that spills on the floor as soon as it happens. Remove soap buildup in the tub or shower regularly. Attach bath mats securely with double-sided non-slip rug tape. Do not have throw rugs and other things on the floor that can make you trip. What can I do in the bedroom? Use night lights. Make sure that you have a light by your bed that is easy to reach. Do not use any sheets or blankets that are too big for  your bed. They should not hang down onto the floor. Have a firm chair that has side arms. You can use this for support while you get dressed. Do not have throw rugs and other things on the floor that can make you trip. What can I do in the kitchen? Clean up any spills right away. Avoid walking on wet floors. Keep items that you use a lot in easy-to-reach places. If you need to reach something above you, use a strong step stool that has a grab bar. Keep electrical cords out of the way. Do not use floor polish or wax that makes floors slippery. If you must use wax, use non-skid floor wax. Do not have throw rugs and other things on the floor that can make you trip. What can I do with my stairs? Do not leave any items on the stairs. Make sure that there are handrails on both sides of the stairs and use them. Fix handrails that are broken or loose. Make sure that handrails are as long as the stairways. Check any carpeting to make sure that it is firmly attached to the stairs. Fix any carpet that is loose or worn. Avoid having throw rugs at the top or bottom of the stairs. If you do have throw rugs, attach them to the floor with carpet tape. Make sure  that you have a light switch at the top of the stairs and the bottom of the stairs. If you do not have them, ask someone to add them for you. What else can I do to help prevent falls? Wear shoes that: Do not have high heels. Have rubber bottoms. Are comfortable and fit you well. Are closed at the toe. Do not wear sandals. If you use a stepladder: Make sure that it is fully opened. Do not climb a closed stepladder. Make sure that both sides of the stepladder are locked into place. Ask someone to hold it for you, if possible. Clearly mark and make sure that you can see: Any grab bars or handrails. First and last steps. Where the edge of each step is. Use tools that help you move around (mobility aids) if they are needed. These  include: Canes. Walkers. Scooters. Crutches. Turn on the lights when you go into a dark area. Replace any light bulbs as soon as they burn out. Set up your furniture so you have a clear path. Avoid moving your furniture around. If any of your floors are uneven, fix them. If there are any pets around you, be aware of where they are. Review your medicines with your doctor. Some medicines can make you feel dizzy. This can increase your chance of falling. Ask your doctor what other things that you can do to help prevent falls. This information is not intended to replace advice given to you by your health care provider. Make sure you discuss any questions you have with your health care provider. Document Released: 07/29/2009 Document Revised: 03/09/2016 Document Reviewed: 11/06/2014 Elsevier Interactive Patient Education  2017 [employer].

## 2021-08-16 NOTE — Progress Notes (Signed)
Subjective:   Travis Herrera is a 65 y.o. male who presents for Medicare Annual/Subsequent preventive examination.  I connected with  Travis Herrera on 08/16/21 by a audio enabled telemedicine application and verified that I am speaking with the correct person using two identifiers.   I discussed the limitations, risks, security and privacy concerns of performing an evaluation and management service by telephone and the availability of in person appointments. I also discussed with the patient that there may be a patient responsible charge related to this service. The patient expressed understanding and verbally consented to this telephonic visit.   Review of Systems           Objective:    There were no vitals filed for this visit. There is no height or weight on file to calculate BMI.  Advanced Directives 07/26/2020 06/24/2018 06/24/2018 06/19/2018 03/19/2018 07/25/2012  Does Patient Have a Medical Advance Directive? No No No No No Patient does not have advance directive;Patient would not like information  Would patient like information on creating a medical advance directive? No - Patient declined Yes (ED - Information included in AVS) Yes (ED - Information included in AVS) No - Patient declined No - Patient declined -  Pre-existing out of facility DNR order (yellow form or pink MOST form) - - - - - No    Current Medications (verified) Outpatient Encounter Medications as of 08/16/2021  Medication Sig   amLODipine (NORVASC) 10 MG tablet Take 1 tablet (10 mg total) by mouth daily.   aspirin EC 81 MG tablet Take 81 mg by mouth daily.   benazepril (LOTENSIN) 20 MG tablet Take 1 tablet (20 mg total) by mouth daily.   Chlorpheniramine-DM (CORICIDIN COUGH/COLD) 4-30 MG TABS Take 1 tablet by mouth every 6 (six) hours as needed.   clotrimazole-betamethasone (LOTRISONE) cream Apply twice daily to affected area(s) for 1 week, then , as needed   Continuous Blood Gluc Receiver (FREESTYLE LIBRE  READER) DEVI Use to check blood sugar daily dx E11.65   Continuous Blood Gluc Sensor (FREESTYLE LIBRE 14 DAY SENSOR) MISC USE TO CHECK GLUCOSE DAILY AND CHANGE EVERY 14 DAYS   Continuous Blood Gluc Sensor (FREESTYLE LIBRE SENSOR SYSTEM) MISC Use to check blood sugar daily dx E11.65   glipiZIDE (GLUCOTROL XL) 5 MG 24 hr tablet Take 1 tablet by mouth once daily with breakfast   glucose blood (ONETOUCH VERIO) test strip USE 1 STRIP TO CHECK GLUCOSE THREE TIMES DAILY AS DIRECTED DX e11.65   Insulin Glargine (LANTUS SOLOSTAR) 100 UNIT/ML Solostar Pen Inject 50 Units into the skin daily with breakfast.   Insulin Pen Needle 31G X 8 MM MISC 1 each by Does not apply route 2 (two) times daily.   Lancets (FREESTYLE) lancets Use as instructed bid   liraglutide (VICTOZA) 18 MG/3ML SOPN Inject 0.3 mLs (1.8 mg total) into the skin every morning. INJECT 1.8 MG INTO THE SKIN DAILY AS DIRECTED   montelukast (SINGULAIR) 10 MG tablet Take 1 tablet (10 mg total) by mouth at bedtime.   ONETOUCH DELICA LANCETS 96V MISC Three times daily testing dx e11.65   rosuvastatin (CRESTOR) 10 MG tablet Take 1 tablet (10 mg total) by mouth daily.   tadalafil (CIALIS) 20 MG tablet Take 1 tablet (20 mg total) by mouth daily as needed for erectile dysfunction.   triamterene-hydrochlorothiazide (MAXZIDE-25) 37.5-25 MG tablet Take 1 tablet by mouth daily.   No facility-administered encounter medications on file as of 08/16/2021.    Allergies (verified)  Patient has no known allergies.   History: Past Medical History:  Diagnosis Date   Diabetes mellitus without complication ([place]) 7616   insulin started in 2012   Hyperlipemia 2013   Hypertension 2010   Past Surgical History:  Procedure Laterality Date   CATARACT EXTRACTION W/PHACO  07/29/2012   Procedure: CATARACT EXTRACTION PHACO AND INTRAOCULAR LENS PLACEMENT ([place]);  Surgeon: Tonny Branch, MD;  Location: [facility];  Service: Ophthalmology;  Laterality: Left;  CDE=1.66    CHOLECYSTECTOMY  2007   [facility]   EYE SURGERY Left 2013   cataract   QUADRICEPS TENDON REPAIR Right 03/19/2018   Procedure: REPAIR QUADRICEP TENDON;  Surgeon: Carole Civil, MD;  Location: [facility];  Service: Orthopedics;  Laterality: Right;   Family History  Problem Relation Age of Onset   Cancer Sister    Early death Father        car accident    Cancer Brother    Hypertension Brother    Diabetes Brother    Hypertension Brother    Social History   Socioeconomic History   Marital status: Married    Spouse name: Not on file   Number of children: 4   Years of education: Not on file   Highest education level: Not on file  Occupational History   Not on file  Tobacco Use   Smoking status: Never   Smokeless tobacco: Never  Vaping Use   Vaping Use: Never used  Substance and Sexual Activity   Alcohol use: No   Drug use: No   Sexual activity: Yes    Birth control/protection: None  Other Topics Concern   Not on file  Social History Narrative   Not on file   Social Determinants of Health   Financial Resource Strain: Not on file  Food Insecurity: Not on file  Transportation Needs: Not on file  Physical Activity: Not on file  Stress: Not on file  Social Connections: Not on file    Tobacco Counseling Counseling given: Not Answered   Clinical Intake:                 Diabetic?Yes           Activities of Daily Living No flowsheet data found.  Patient Care Team: Fayrene Helper, MD as PCP - General (Family Medicine)  Indicate any recent Medical Services you may have received from other than [facility] providers in the past year (date may be approximate).     Assessment:   This is a routine wellness examination for Travis Herrera.  Hearing/Vision screen No results found.  Dietary issues and exercise activities discussed:     Goals Addressed   None   Depression Screen PHQ 2/9 Scores 05/10/2021 05/10/2021 03/29/2021 11/22/2020 07/26/2020 07/26/2020  06/26/2019  PHQ - 2 Score 3 0 0 0 0 0 0  PHQ- 9 Score 13 - - - - - -    Fall Risk Fall Risk  05/27/2021 05/10/2021 03/29/2021 11/22/2020 07/26/2020  Falls in the past year? 0 0 1 0 0  Number falls in past yr: - 0 0 0 0  Injury with Fall? - 0 0 0 0  Risk for fall due to : - No Fall Risks No Fall Risks;Impaired balance/gait No Fall Risks No Fall Risks  Follow up Falls evaluation completed Falls evaluation completed Falls evaluation completed Falls evaluation completed Falls evaluation completed    [place]:  Any stairs in or around the home? Yes  If so, are there any without handrails? Yes  Home free of loose throw rugs in walkways, pet beds, electrical cords, etc? Yes  Adequate lighting in your home to reduce risk of falls? Yes   ASSISTIVE DEVICES UTILIZED TO PREVENT FALLS:  Life alert? No  Use of a cane, walker or w/c? Yes  Grab bars in the bathroom? Yes  Shower chair or bench in shower? No  Elevated toilet seat or a handicapped toilet? No   TIMED UP AND GO:  Was the test performed? No .  Length of time to ambulate 10 feet: n/a sec.     Cognitive Function:     6CIT Screen 07/26/2020 06/26/2019 06/24/2018  What Year? 0 points 0 points 0 points  What month? 0 points 0 points 0 points  What time? 0 points 0 points 0 points  Count back from 20 0 points 0 points 0 points  Months in reverse 0 points 0 points 0 points  Repeat phrase 0 points 0 points 0 points  Total Score 0 0 0    Immunizations Immunization History  Administered Date(s) Administered   Influenza Split 07/17/2014   Influenza,inj,Quad PF,6+ Mos 08/16/2015, 07/26/2016, 08/02/2017, 07/19/2018, 06/16/2019, 07/02/2020   Moderna Sars-Covid-2 Vaccination 12/27/2019, 01/28/2020   PFIZER(Purple Top)SARS-COV-2 Vaccination 09/24/2020   Pneumococcal Polysaccharide-23 09/15/2019    TDAP status: Due, Education has been provided regarding the importance of this vaccine. Advised may receive  this vaccine at local pharmacy or Health Dept. Aware to provide a copy of the vaccination record if obtained from local pharmacy or Health Dept. Verbalized acceptance and understanding.  Flu Vaccine status: Due, Education has been provided regarding the importance of this vaccine. Advised may receive this vaccine at local pharmacy or Health Dept. Aware to provide a copy of the vaccination record if obtained from local pharmacy or Health Dept. Verbalized acceptance and understanding.  Pneumococcal vaccine status: Due, Education has been provided regarding the importance of this vaccine. Advised may receive this vaccine at local pharmacy or Health Dept. Aware to provide a copy of the vaccination record if obtained from local pharmacy or Health Dept. Verbalized acceptance and understanding.  Covid-19 vaccine status: Completed vaccines  Qualifies for Shingles Vaccine? Yes   Zostavax completed No   Shingrix Completed?: No.    Education has been provided regarding the importance of this vaccine. Patient has been advised to call insurance company to determine out of pocket expense if they have not yet received this vaccine. Advised may also receive vaccine at local pharmacy or Health Dept. Verbalized acceptance and understanding.  Screening Tests Health Maintenance  Topic Date Due   Zoster Vaccines- Shingrix (1 of 2) Never done   OPHTHALMOLOGY EXAM  07/10/2017   Pneumococcal Vaccine 41-7 Years old (2 - PCV) 09/14/2020   COVID-19 Vaccine (4 - Booster for Moderna series) 11/19/2020   TETANUS/TDAP  05/03/2021   INFLUENZA VACCINE  05/16/2021   COLONOSCOPY (Pts 45-63yrs Insurance coverage will need to be confirmed)  03/24/2022 (Originally 02/06/2019)   HEMOGLOBIN A1C  11/27/2021   FOOT EXAM  05/27/2022   Hepatitis C Screening  Completed   HIV Screening  Completed   HPV VACCINES  Aged Out    Health Maintenance  Health Maintenance Due  Topic Date Due   Zoster Vaccines- Shingrix (1 of 2) Never done    OPHTHALMOLOGY EXAM  07/10/2017   Pneumococcal Vaccine 76-66 Years old (2 - PCV) 09/14/2020   COVID-19 Vaccine (4 - Booster for Moderna series) 11/19/2020  TETANUS/TDAP  05/03/2021   INFLUENZA VACCINE  05/16/2021    Colorectal cancer screening: Type of screening: Colonoscopy. Completed 02/02/2009. Repeat every 10 years  Lung Cancer Screening: (Low Dose CT Chest recommended if Age 69-80 years, 30 pack-year currently smoking OR have quit w/in 15years.) does not qualify.   Lung Cancer Screening Referral: n/a  Additional Screening:  Hepatitis C Screening: does qualify; Completed 07/08/2015  Vision Screening: Recommended annual ophthalmology exams for early detection of glaucoma and other disorders of the eye. Is the patient up to date with their annual eye exam?  Yes  Who is the provider or what is the name of the office in which the patient attends annual eye exams? [facility]  If pt is not established with a provider, would they like to be referred to a provider to establish care? No .   Dental Screening: Recommended annual dental exams for proper oral hygiene  Community Resource Referral / Chronic Care Management: CRR required this visit?  No   CCM required this visit?  No      Plan:     I have personally reviewed and noted the following in the patient's chart:   Medical and social history Use of alcohol, tobacco or illicit drugs  Current medications and supplements including opioid prescriptions. Patient is not currently taking opioid prescriptions. Functional ability and status Nutritional status Physical activity Advanced directives List of other physicians Hospitalizations, surgeries, and ER visits in previous 12 months Vitals Screenings to include cognitive, depression, and falls Referrals and appointments  In addition, I have reviewed and discussed with patient certain preventive protocols, quality metrics, and best practice recommendations. A  written personalized care plan for preventive services as well as general preventive health recommendations were provided to patient.     Quentin Angst, [place]   08/16/2021   Nurse Notes: this is a tele health visit with the patient at home. The provider was in the office and is Rutwik Patel,MD

## 2021-08-17 ENCOUNTER — Other Ambulatory Visit: Payer: Self-pay | Admitting: Nurse Practitioner

## 2021-08-17 DIAGNOSIS — Z01818 Encounter for other preprocedural examination: Secondary | ICD-10-CM | POA: Diagnosis not present

## 2021-08-17 DIAGNOSIS — H25811 Combined forms of age-related cataract, right eye: Secondary | ICD-10-CM | POA: Diagnosis not present

## 2021-08-17 NOTE — Telephone Encounter (Signed)
Called and spoke to spouse and the patient will upload pictures when gets home from eye appointment and call our office to schedule the virtual call.

## 2021-08-18 ENCOUNTER — Telehealth: Payer: PPO | Admitting: Family Medicine

## 2021-08-18 ENCOUNTER — Encounter: Payer: Self-pay | Admitting: Family Medicine

## 2021-08-18 ENCOUNTER — Other Ambulatory Visit: Payer: Self-pay

## 2021-08-18 MED ORDER — GLIPIZIDE ER 5 MG PO TB24: 5.0000 mg | ORAL_TABLET | Freq: Every day | ORAL | 0 refills | Status: DC

## 2021-08-26 DIAGNOSIS — H2511 Age-related nuclear cataract, right eye: Secondary | ICD-10-CM | POA: Diagnosis not present

## 2021-08-26 DIAGNOSIS — H25811 Combined forms of age-related cataract, right eye: Secondary | ICD-10-CM | POA: Diagnosis not present

## 2021-08-26 HISTORY — PX: CATARACT EXTRACTION: SUR2

## 2021-09-02 DIAGNOSIS — H2511 Age-related nuclear cataract, right eye: Secondary | ICD-10-CM | POA: Diagnosis not present

## 2021-09-12 ENCOUNTER — Other Ambulatory Visit: Payer: Self-pay | Admitting: Family Medicine

## 2021-09-13 MED ORDER — FREESTYLE LIBRE 14 DAY SENSOR MISC: 0 refills | Status: DC

## 2021-09-22 DIAGNOSIS — Z961 Presence of intraocular lens: Secondary | ICD-10-CM | POA: Diagnosis not present

## 2021-09-26 ENCOUNTER — Ambulatory Visit (INDEPENDENT_AMBULATORY_CARE_PROVIDER_SITE_OTHER): Payer: PPO | Admitting: Nurse Practitioner

## 2021-09-26 ENCOUNTER — Other Ambulatory Visit: Payer: Self-pay

## 2021-09-26 ENCOUNTER — Encounter: Payer: Self-pay | Admitting: Nurse Practitioner

## 2021-09-26 VITALS — BP 114/70 | HR 73 | Ht 70.0 in | Wt 218.4 lb

## 2021-09-26 DIAGNOSIS — N1831 Chronic kidney disease, stage 3a: Secondary | ICD-10-CM

## 2021-09-26 DIAGNOSIS — E782 Mixed hyperlipidemia: Secondary | ICD-10-CM | POA: Diagnosis not present

## 2021-09-26 DIAGNOSIS — E1122 Type 2 diabetes mellitus with diabetic chronic kidney disease: Secondary | ICD-10-CM

## 2021-09-26 DIAGNOSIS — Z794 Long term (current) use of insulin: Secondary | ICD-10-CM

## 2021-09-26 DIAGNOSIS — I10 Essential (primary) hypertension: Secondary | ICD-10-CM | POA: Diagnosis not present

## 2021-09-26 LAB — POCT GLYCOSYLATED HEMOGLOBIN (HGB A1C): HbA1c, POC (controlled diabetic range): 7.6 % — AB (ref 0.0–7.0)

## 2021-09-26 MED ORDER — GLIPIZIDE ER 5 MG PO TB24: 5.0000 mg | ORAL_TABLET | Freq: Every day | ORAL | 3 refills | Status: DC

## 2021-09-26 MED ORDER — LANTUS SOLOSTAR 100 UNIT/ML ~~LOC~~ SOPN: 50.0000 [IU] | PEN_INJECTOR | Freq: Every day | SUBCUTANEOUS | 3 refills | Status: DC

## 2021-09-26 MED ORDER — VICTOZA 18 MG/3ML ~~LOC~~ SOPN: 1.8000 mg | PEN_INJECTOR | SUBCUTANEOUS | 3 refills | Status: DC

## 2021-09-26 NOTE — Progress Notes (Signed)
09/26/2021  Endocrinology follow-up note   Subjective:    Patient ID: Travis Herrera, male    DOB: [birth year],    Past Medical History:  Diagnosis Date   Diabetes mellitus without complication ([place]) 6283   insulin started in 2012   Hyperlipemia 2013   Hypertension 2010   Past Surgical History:  Procedure Laterality Date   CATARACT EXTRACTION Right 08/26/2021   CATARACT EXTRACTION W/PHACO  07/29/2012   Procedure: CATARACT EXTRACTION PHACO AND INTRAOCULAR LENS PLACEMENT ([place]);  Surgeon: Tonny Branch, MD;  Location: [facility];  Service: Ophthalmology;  Laterality: Left;  CDE=1.66   CHOLECYSTECTOMY  2007   [facility]   EYE SURGERY Left 2013   cataract   QUADRICEPS TENDON REPAIR Right 03/19/2018   Procedure: REPAIR QUADRICEP TENDON;  Surgeon: Carole Civil, MD;  Location: [facility];  Service: Orthopedics;  Laterality: Right;   Social History   Socioeconomic History   Marital status: Married    Spouse name: Not on file   Number of children: 4   Years of education: Not on file   Highest education level: Not on file  Occupational History   Not on file  Tobacco Use   Smoking status: Never   Smokeless tobacco: Never  Vaping Use   Vaping Use: Never used  Substance and Sexual Activity   Alcohol use: No   Drug use: No   Sexual activity: Yes    Birth control/protection: None  Other Topics Concern   Not on file  Social History Narrative   Not on file   Social Determinants of Health   Financial Resource Strain: Low Risk    Difficulty of Paying Living Expenses: Not hard at all  Food Insecurity: No Food Insecurity   Worried About Charity fundraiser in the Last Year: Never true   [place] in the Last Year: Never true  Transportation Needs: No Transportation Needs   Lack of Transportation (Medical): No   Lack of Transportation (Non-Medical): No  Physical Activity: Sufficiently Active   Days of Exercise per Week: 7 days   Minutes of Exercise per Session: 60 min   Stress: No Stress Concern Present   Feeling of Stress : Not at all  Social Connections: Socially Integrated   Frequency of Communication with Friends and Family: More than three times a week   Frequency of Social Gatherings with Friends and Family: More than three times a week   Attends Religious Services: More than 4 times per year   Active Member of [employer] or Organizations: Yes   Attends Archivist Meetings: 1 to 4 times per year   Marital Status: Married   Outpatient Encounter Medications as of 09/26/2021  Medication Sig   amLODipine (NORVASC) 10 MG tablet Take 1 tablet (10 mg total) by mouth daily.   aspirin EC 81 MG tablet Take 81 mg by mouth daily.   benazepril (LOTENSIN) 20 MG tablet Take 1 tablet (20 mg total) by mouth daily.   Chlorpheniramine-DM (CORICIDIN COUGH/COLD) 4-30 MG TABS Take 1 tablet by mouth every 6 (six) hours as needed.   clotrimazole-betamethasone (LOTRISONE) cream Apply twice daily to affected area(s) for 1 week, then , as needed   Continuous Blood Gluc Receiver (FREESTYLE LIBRE READER) DEVI Use to check blood sugar daily dx E11.65   Continuous Blood Gluc Sensor (FREESTYLE LIBRE 14 DAY SENSOR) MISC USE TO CHECK GLUCOSE DAILY AND CHANGE EVERY 14 DAYS   Continuous Blood Gluc Sensor (FREESTYLE LIBRE SENSOR  SYSTEM) MISC Use to check blood sugar daily dx E11.65   glucose blood (ONETOUCH VERIO) test strip USE 1 STRIP TO CHECK GLUCOSE THREE TIMES DAILY AS DIRECTED DX e11.65   Insulin Pen Needle 31G X 8 MM MISC 1 each by Does not apply route 2 (two) times daily.   Lancets (FREESTYLE) lancets Use as instructed bid   montelukast (SINGULAIR) 10 MG tablet Take 1 tablet (10 mg total) by mouth at bedtime.   ONETOUCH DELICA LANCETS 80K MISC Three times daily testing dx e11.65   prednisoLONE acetate (PRED FORTE) 1 % ophthalmic suspension Place into the right eye.   rosuvastatin (CRESTOR) 10 MG tablet Take 1 tablet (10 mg total) by mouth daily.   tadalafil (CIALIS) 20  MG tablet Take 1 tablet (20 mg total) by mouth daily as needed for erectile dysfunction.   triamterene-hydrochlorothiazide (MAXZIDE-25) 37.5-25 MG tablet Take 1 tablet by mouth daily.   [DISCONTINUED] glipiZIDE (GLUCOTROL XL) 5 MG 24 hr tablet Take 1 tablet (5 mg total) by mouth daily with breakfast.   [DISCONTINUED] Insulin Glargine (LANTUS SOLOSTAR) 100 UNIT/ML Solostar Pen Inject 50 Units into the skin daily with breakfast.   [DISCONTINUED] liraglutide (VICTOZA) 18 MG/3ML SOPN Inject 0.3 mLs (1.8 mg total) into the skin every morning. INJECT 1.8 MG INTO THE SKIN DAILY AS DIRECTED   glipiZIDE (GLUCOTROL XL) 5 MG 24 hr tablet Take 1 tablet (5 mg total) by mouth daily with breakfast.   insulin glargine (LANTUS SOLOSTAR) 100 UNIT/ML Solostar Pen Inject 50 Units into the skin daily with breakfast.   liraglutide (VICTOZA) 18 MG/3ML SOPN Inject 1.8 mg into the skin every morning. INJECT 1.8 MG INTO THE SKIN DAILY AS DIRECTED   No facility-administered encounter medications on file as of 09/26/2021.   ALLERGIES: No Known Allergies VACCINATION STATUS: Immunization History  Administered Date(s) Administered   Influenza Split 07/17/2014   Influenza,inj,Quad PF,6+ Mos 08/16/2015, 07/26/2016, 08/02/2017, 07/19/2018, 06/16/2019, 07/02/2020   Influenza-Unspecified 08/02/2021   Moderna Sars-Covid-2 Vaccination 12/27/2019, 01/28/2020   PFIZER(Purple Top)SARS-COV-2 Vaccination 09/24/2020   Pneumococcal Polysaccharide-23 09/15/2019    Diabetes He presents for his follow-up diabetic visit. He has type 2 diabetes mellitus. Onset time: He was diagnosed at approximate age of 4 years. His disease course has been stable. There are no hypoglycemic associated symptoms. Pertinent negatives for hypoglycemia include no confusion, headaches, pallor or seizures. Pertinent negatives for diabetes include no chest pain, no fatigue, no polydipsia, no polyphagia, no polyuria and no weakness. There are no hypoglycemic  complications. Symptoms are stable. Diabetic complications include nephropathy. Risk factors for coronary artery disease include diabetes mellitus, dyslipidemia, hypertension, family history, male sex, sedentary lifestyle and obesity. Current diabetic treatment includes insulin injections and oral agent (monotherapy) (and Victoza). He is compliant with treatment most of the time. His weight is fluctuating minimally. He is following a generally healthy diet. When asked about meal planning, he reported none. He has not had a previous visit with a dietitian. He rarely participates in exercise. His home blood glucose trend is fluctuating minimally. His overall blood glucose range is 140-180 mg/dl. (He presents today, accompanied by his wife, with his CGM and logs showing at target fasting and postprandial glycemic profile.  His POCT A1c today is 7.6%, essentially unchanged from previous visit.  He did say he got another steroid injection in his left knee and has steroid eye drops post cataract surgery.  Analysis of his CGM shows TIR 75%, TAR 25%, TBR 0% with a GMI of 7.0%.  He  denies any significant hypoglycemia.) An ACE inhibitor/angiotensin II receptor blocker is being taken. He does not see a podiatrist.Eye exam is current.  Hypertension This is a chronic problem. The current episode started more than 1 year ago. The problem has been gradually improving since onset. The problem is controlled. Pertinent negatives include no chest pain, headaches, neck pain, palpitations or shortness of breath. There are no associated agents to hypertension. Risk factors for coronary artery disease include dyslipidemia, diabetes mellitus, male gender, obesity and sedentary lifestyle. Past treatments include ACE inhibitors, diuretics and calcium channel blockers. The current treatment provides moderate improvement. There are no compliance problems.  Hypertensive end-organ damage includes kidney disease. Identifiable causes of  hypertension include chronic renal disease.  Hyperlipidemia This is a chronic problem. The current episode started more than 1 year ago. The problem is controlled. Recent lipid tests were reviewed and are normal. Exacerbating diseases include chronic renal disease, diabetes and obesity. Factors aggravating his hyperlipidemia include fatty foods. Pertinent negatives include no chest pain, myalgias or shortness of breath. Current antihyperlipidemic treatment includes statins. The current treatment provides moderate improvement of lipids. Compliance problems include adherence to diet and adherence to exercise.  Risk factors for coronary artery disease include dyslipidemia, diabetes mellitus, hypertension, male sex, a sedentary lifestyle and obesity.     Review of systems  Constitutional: + Minimally fluctuating body weight,  current Body mass index is 31.34 kg/m. , no fatigue, no subjective hyperthermia, no subjective hypothermia Eyes: no blurry vision, no xerophthalmia ENT: no sore throat, no nodules palpated in throat, no dysphagia/odynophagia, no hoarseness Cardiovascular: no chest pain, no shortness of breath, no palpitations, no leg swelling Respiratory: no cough, no shortness of breath Gastrointestinal: no nausea/vomiting/diarrhea Musculoskeletal: L knee pain with associated swelling- has had cortisone shot with some relief Skin: no rashes, no hyperemia Neurological: no tremors, no numbness, no tingling, no dizziness Psychiatric: no depression, no anxiety    Objective:    BP 114/70   Pulse 73   Ht 5\' 10"  (1.778 m)   Wt 218 lb 6.4 oz (99.1 kg)   BMI 31.34 kg/m   Wt Readings from Last 3 Encounters:  09/26/21 218 lb 6.4 oz (99.1 kg)  05/27/21 216 lb (98 kg)  05/10/21 216 lb (98 kg)    BP Readings from Last 3 Encounters:  09/26/21 114/70  05/27/21 111/79  05/10/21 109/73     Physical Exam- Limited  Constitutional:  Body mass index is 31.34 kg/m. , not in acute distress,  normal state of mind Eyes:  EOMI, no exophthalmos Neck: Supple Cardiovascular: RRR, no murmurs, rubs, or gallops, no edema Respiratory: Adequate breathing efforts, no crackles, rales, rhonchi, or wheezing Musculoskeletal: no gross deformities, strength intact in all four extremities, no gross restriction of joint movements Skin:  no rashes, no hyperemia Neurological: no tremor with outstretched hands     Results for orders placed or performed in visit on 09/26/21  POCT glycosylated hemoglobin (Hb A1C)  Result Value Ref Range   Hemoglobin A1C     HbA1c POC (<> result, manual entry)     HbA1c, POC (prediabetic range)     HbA1c, POC (controlled diabetic range) 7.6 (A) 0.0 - 7.0 %   Diabetic Labs (most recent): Lab Results  Component Value Date   HGBA1C 7.6 (A) 09/26/2021   HGBA1C 7.4 (A) 05/27/2021   HGBA1C 7.8 (H) 01/17/2021   Lipid Panel     Component Value Date/Time   CHOL 105 01/17/2021 0830   TRIG 97  01/17/2021 0830   HDL 32 (L) 01/17/2021 0830   CHOLHDL 3.3 01/17/2021 0830   CHOLHDL 3.2 07/13/2020 0840   VLDL 12 01/16/2017 0940   LDLCALC 54 01/17/2021 0830   LDLCALC 36 07/13/2020 0840      Assessment & Plan:   1) Diabetes mellitus type 2 without complications, with long term insulin use ([place])  He presents today, accompanied by his wife, with his CGM and logs showing at target fasting and postprandial glycemic profile.  His POCT A1c today is 7.6%, essentially unchanged from previous visit.  He did say he got another steroid injection in his left knee and has steroid eye drops post cataract surgery.  Analysis of his CGM shows TIR 75%, TAR 25%, TBR 0% with a GMI of 7.0%.  He denies any significant hypoglycemia.    Recent labs reviewed, showing mild renal insufficiency (stable and somewhat improved).  - Patient remains at a high risk for more acute and chronic complications of diabetes which include CAD, CVA, CKD, retinopathy, and neuropathy. These are all discussed in  detail with the patient.  - Nutritional counseling repeated at each appointment due to patients tendency to fall back in to old habits.  - The patient admits there is a room for improvement in their diet and drink choices. -  Suggestion is made for the patient to avoid simple carbohydrates from their diet including Cakes, Sweet Desserts / Pastries, Ice Cream, Soda (diet and regular), Sweet Tea, Candies, Chips, Cookies, Sweet Pastries, Store Bought Juices, Alcohol in Excess of 1-2 drinks a day, Artificial Sweeteners, Coffee Creamer, and "Sugar-free" Products. This will help patient to have stable blood glucose profile and potentially avoid unintended weight gain.   - I encouraged the patient to switch to unprocessed or minimally processed complex starch and increased protein intake (animal or plant source), fruits, and vegetables.   - Patient is advised to stick to a routine mealtimes to eat 3 meals a day and avoid unnecessary snacks (to snack only to correct hypoglycemia).  - I have approached patient with the following individualized plan to manage diabetes and patient agrees.  Given his stable, at goal glycemic profile, he is advised to continue his Lantus 50 units SQ nightly, Victoza 1.8 mg SQ daily, and Glipizide 5 mg XL daily with breakfast.  -He is advised to continue monitoring blood glucose at least twice daily using his CGM, before breakfast and before bed, and call the clinic if he gets readings less than 70 or greater than 200 for 3 tests in a row.  - Patient specific target  for A1c; LDL, HDL, Triglycerides, and  Waist Circumference were discussed in detail.  2) BP/HTN: His blood pressure is controlled to target.  He is advised to continue Amlodipine 10 mg po daily, Benazepril 20 mg po daily, and Triamterene-HCT 37.5-25 mg po daily.  3) Lipids/HPL: Most recent lipid panel from 01/17/21 shows controlled LDL at 54.  He is advised to continue Crestor 10 mg po daily at bedtime.  Side  effects and precautions discussed with him.  He is advised to avoid fried foods and butter.  Will recheck lipid panel prior to next visit.   4)  Weight/Diet:  His Body mass index is 31.34 kg/m.-is a candidate for modest weight loss.  CDE consult in progress, exercise, and carbohydrates information provided.  5) Chronic Care/Health Maintenance: -Patient on ACEI and Statin medications and encouraged to continue to follow up with Ophthalmology, Podiatrist at least yearly or according to recommendations,  and advised to stay away from smoking. I have recommended yearly flu vaccine and pneumonia vaccination at least every 5 years; moderate intensity exercise for up to 150 minutes weekly; and  sleep for at least 7 hours a day.  I advised patient to maintain close follow up with his PCP for primary care needs.      I spent 40 minutes in the care of the patient today including review of labs from [place], Lipids, Thyroid Function, Hematology (current and previous including abstractions from other facilities); face-to-face time discussing  his blood glucose readings/logs, discussing hypoglycemia and hyperglycemia episodes and symptoms, medications doses, his options of short and long term treatment based on the latest standards of care / guidelines;  discussion about incorporating lifestyle medicine;  and documenting the encounter.    Please refer to Patient Instructions for Blood Glucose Monitoring and Insulin/Medications Dosing Guide"  in media tab for additional information. Please  also refer to " Patient Self Inventory" in the Media  tab for reviewed elements of pertinent patient history.  Travis Herrera participated in the discussions, expressed understanding, and voiced agreement with the above plans.  All questions were answered to his satisfaction. he is encouraged to contact clinic should he have any questions or concerns prior to his return visit.    Follow up plan: Return in about 4 months  (around 01/25/2022) for Diabetes F/U- A1c and UM in office, Previsit labs, Bring meter and logs.    Rayetta Pigg, [facility] [address] [place], [place] [address] Phone: [phone] Fax: [phone]  09/26/2021, 8:57 AM

## 2021-09-26 NOTE — Patient Instructions (Signed)

## 2021-10-05 ENCOUNTER — Other Ambulatory Visit: Payer: Self-pay | Admitting: Family Medicine

## 2021-10-05 MED ORDER — FREESTYLE LIBRE 14 DAY SENSOR MISC: 0 refills | Status: DC

## 2021-10-24 ENCOUNTER — Other Ambulatory Visit: Payer: Self-pay | Admitting: Family Medicine

## 2021-10-24 MED ORDER — FREESTYLE LIBRE 14 DAY SENSOR MISC: 0 refills | Status: DC

## 2021-10-26 ENCOUNTER — Encounter: Payer: Self-pay | Admitting: Family Medicine

## 2021-10-26 ENCOUNTER — Other Ambulatory Visit: Payer: Self-pay

## 2021-10-26 ENCOUNTER — Ambulatory Visit (INDEPENDENT_AMBULATORY_CARE_PROVIDER_SITE_OTHER): Payer: PPO | Admitting: Family Medicine

## 2021-10-26 VITALS — BP 136/82 | HR 68 | Ht 70.0 in | Wt 221.1 lb

## 2021-10-26 DIAGNOSIS — N1831 Chronic kidney disease, stage 3a: Secondary | ICD-10-CM

## 2021-10-26 DIAGNOSIS — Z0000 Encounter for general adult medical examination without abnormal findings: Secondary | ICD-10-CM

## 2021-10-26 DIAGNOSIS — Z23 Encounter for immunization: Secondary | ICD-10-CM

## 2021-10-26 DIAGNOSIS — Z794 Long term (current) use of insulin: Secondary | ICD-10-CM

## 2021-10-26 DIAGNOSIS — Z125 Encounter for screening for malignant neoplasm of prostate: Secondary | ICD-10-CM

## 2021-10-26 DIAGNOSIS — E1122 Type 2 diabetes mellitus with diabetic chronic kidney disease: Secondary | ICD-10-CM

## 2021-10-26 DIAGNOSIS — N182 Chronic kidney disease, stage 2 (mild): Secondary | ICD-10-CM

## 2021-10-26 DIAGNOSIS — I10 Essential (primary) hypertension: Secondary | ICD-10-CM

## 2021-10-26 NOTE — Assessment & Plan Note (Signed)

## 2021-10-26 NOTE — Progress Notes (Signed)
° °  Travis Herrera     MRN: [id number]      DOB: 12/28/1955   HPI: Patient is in for annual physical exam. Has questions about medication used in diabetic kidney disease, wants to know if he could benefit as he has a sibling who has.He is aware that he does have renal disease  Recent labs, if available are reviewed. Immunization is reviewed , and  updated if needed.    PE; BP 136/82    Pulse 68    Ht 5\' 10"  (1.778 m)    Wt 221 lb 1.9 oz (100.3 kg)    SpO2 97%    BMI 31.73 kg/m   Pleasant male, alert and oriented x 3, in no cardio-pulmonary distress. Afebrile. HEENT No facial trauma or asymetry. Sinuses non tender. EOMI External ears normal,  Neck: supple, no adenopathy,JVD or thyromegaly.No bruits.  Chest: Clear to ascultation bilaterally.No crackles or wheezes. Non tender to palpation  Cardiovascular system; Heart sounds normal,  S1 and  S2 ,no S3.  No murmur, or thrill. Apical beat not displaced Peripheral pulses normal.  Abdomen: Soft, non tender, no organomegaly or masses. No bruits. Bowel sounds normal. No guarding, tenderness or rebound.    Musculoskeletal exam: Decreased though adequate ROM of spine, hips , shoulders and knees. deformity ,swelling and  crepitus noted in left knee  Neurologic: Cranial nerves 2 to 12 intact. Power, tone ,sensation and reflexes normal throughout. No disturbance in gait. No tremor.  Skin: Intact, no ulceration, erythema , scaling or rash noted. Pigmentation normal throughout  Psych; Normal mood and affect. Judgement and concentration normal   Assessment & Plan:  Annual physical exam Annual exam as documented. Counseling done  re healthy lifestyle involving commitment to 150 minutes exercise per week, heart healthy diet, and attaining healthy weight.The importance of adequate sleep also discussed. Regular seat belt use and home safety, is also discussed. Changes in health habits are decided on by the patient with goals  and time frames  set for achieving them. Immunization and cancer screening needs are specifically addressed at this visit.

## 2021-10-26 NOTE — Patient Instructions (Addendum)
F/U in m 6 months, call if you need me before  Pneumonia 20 today !!  NEED covid vaccine and shingrix vaccines  CBC, PSA and microalb April 5 or after  It is important that you exercise regularly at least 30 minutes 5 times a week. If you develop chest pain, have severe difficulty breathing, or feel very tired, stop exercising immediately and seek medical attention   HBA1C goal less than 7  You are referred to Kidney Specialist, Dr Marylene Buerger  Think about what you will eat, plan ahead. Choose " clean, green, fresh or frozen" over canned, processed or packaged foods which are more sugary, salty and fatty. 70 to 75% of food eaten should be vegetables and fruit. Three meals at set times with snacks allowed between meals, but they must be fruit or vegetables. Aim to eat over a 12 hour period , example 7 am to 7 pm, and STOP after  your last meal of the day. Drink water,generally about 64 ounces per day, no other drink is as healthy. Fruit juice is best enjoyed in a healthy way, by EATING the fruit. Thanks for choosing [facility], we consider it a privelige to serve you.

## 2021-11-02 ENCOUNTER — Telehealth: Payer: Self-pay | Admitting: Nurse Practitioner

## 2021-11-02 DIAGNOSIS — N1831 Chronic kidney disease, stage 3a: Secondary | ICD-10-CM

## 2021-11-02 DIAGNOSIS — E1122 Type 2 diabetes mellitus with diabetic chronic kidney disease: Secondary | ICD-10-CM

## 2021-11-02 NOTE — Telephone Encounter (Signed)
Pt called about paperwork needing to be filled out from the health department for his victoza. They state it was faxed 09/27/21. They just refaxed it and I placed it in nurse box.

## 2021-11-04 NOTE — Telephone Encounter (Signed)
Pt would like a call back once this has been completed. He states he has been waiting over a month.

## 2021-11-04 NOTE — Telephone Encounter (Signed)
Just received pt's paperwork from Health Dept., will fill them out and get them faxed back asap.

## 2021-11-04 NOTE — Telephone Encounter (Signed)
Faxed patient assistance forms to [facility].

## 2021-11-04 NOTE — Telephone Encounter (Signed)
Tried to contact pt to inform him it was faxed this morning. No answer.

## 2021-11-04 NOTE — Telephone Encounter (Signed)
Pt called back, informed patient

## 2021-11-04 NOTE — Telephone Encounter (Signed)
I have looked through my paperwork and faxes and I do not have this one. Do you have this paperwork?

## 2021-11-07 DIAGNOSIS — H26492 Other secondary cataract, left eye: Secondary | ICD-10-CM | POA: Diagnosis not present

## 2021-11-15 ENCOUNTER — Other Ambulatory Visit: Payer: Self-pay | Admitting: Family Medicine

## 2021-11-16 MED ORDER — VICTOZA 18 MG/3ML ~~LOC~~ SOPN: 1.8000 mg | PEN_INJECTOR | SUBCUTANEOUS | 0 refills | Status: DC

## 2021-11-16 MED ORDER — AMLODIPINE BESYLATE 10 MG PO TABS: 10.0000 mg | ORAL_TABLET | Freq: Every day | ORAL | 0 refills | Status: DC

## 2021-11-16 NOTE — Telephone Encounter (Signed)
Patient said that the victoza is on back order with the health dept. They said please send a script to [place] [facility] for 30 days and he can get it free.

## 2021-11-16 NOTE — Addendum Note (Signed)
Addended by: Ellin Saba on: 11/16/2021 04:28 PM   Modules accepted: Orders

## 2021-11-16 NOTE — Telephone Encounter (Signed)
Rx sent 

## 2021-11-21 ENCOUNTER — Other Ambulatory Visit: Payer: Self-pay | Admitting: Nephrology

## 2021-11-21 ENCOUNTER — Other Ambulatory Visit (HOSPITAL_COMMUNITY): Payer: Self-pay | Admitting: Nephrology

## 2021-11-21 DIAGNOSIS — D638 Anemia in other chronic diseases classified elsewhere: Secondary | ICD-10-CM

## 2021-11-21 DIAGNOSIS — I129 Hypertensive chronic kidney disease with stage 1 through stage 4 chronic kidney disease, or unspecified chronic kidney disease: Secondary | ICD-10-CM | POA: Diagnosis not present

## 2021-11-21 DIAGNOSIS — Z6831 Body mass index (BMI) 31.0-31.9, adult: Secondary | ICD-10-CM | POA: Diagnosis not present

## 2021-11-21 DIAGNOSIS — E1122 Type 2 diabetes mellitus with diabetic chronic kidney disease: Secondary | ICD-10-CM

## 2021-11-21 DIAGNOSIS — Z79899 Other long term (current) drug therapy: Secondary | ICD-10-CM | POA: Diagnosis not present

## 2021-11-21 DIAGNOSIS — N189 Chronic kidney disease, unspecified: Secondary | ICD-10-CM | POA: Diagnosis not present

## 2021-11-24 DIAGNOSIS — N189 Chronic kidney disease, unspecified: Secondary | ICD-10-CM | POA: Diagnosis not present

## 2021-11-24 DIAGNOSIS — D638 Anemia in other chronic diseases classified elsewhere: Secondary | ICD-10-CM | POA: Diagnosis not present

## 2021-11-24 DIAGNOSIS — Z79899 Other long term (current) drug therapy: Secondary | ICD-10-CM | POA: Diagnosis not present

## 2021-11-24 DIAGNOSIS — I129 Hypertensive chronic kidney disease with stage 1 through stage 4 chronic kidney disease, or unspecified chronic kidney disease: Secondary | ICD-10-CM | POA: Diagnosis not present

## 2021-11-24 DIAGNOSIS — E559 Vitamin D deficiency, unspecified: Secondary | ICD-10-CM | POA: Diagnosis not present

## 2021-11-24 DIAGNOSIS — E1122 Type 2 diabetes mellitus with diabetic chronic kidney disease: Secondary | ICD-10-CM | POA: Diagnosis not present

## 2021-11-24 DIAGNOSIS — Z6831 Body mass index (BMI) 31.0-31.9, adult: Secondary | ICD-10-CM | POA: Diagnosis not present

## 2021-11-24 LAB — HEMOGLOBIN A1C: Hemoglobin A1C: 8.3

## 2021-11-28 ENCOUNTER — Other Ambulatory Visit: Payer: Self-pay

## 2021-11-28 ENCOUNTER — Ambulatory Visit (HOSPITAL_COMMUNITY)
Admission: RE | Admit: 2021-11-28 | Discharge: 2021-11-28 | Disposition: A | Discharge status: Home / Self Care | Payer: PPO | Source: Ambulatory Visit | Attending: Nephrology | Admitting: Nephrology

## 2021-11-28 DIAGNOSIS — D638 Anemia in other chronic diseases classified elsewhere: Secondary | ICD-10-CM | POA: Insufficient documentation

## 2021-11-28 DIAGNOSIS — I129 Hypertensive chronic kidney disease with stage 1 through stage 4 chronic kidney disease, or unspecified chronic kidney disease: Secondary | ICD-10-CM | POA: Diagnosis not present

## 2021-11-28 DIAGNOSIS — E1122 Type 2 diabetes mellitus with diabetic chronic kidney disease: Secondary | ICD-10-CM | POA: Diagnosis not present

## 2021-11-28 DIAGNOSIS — N181 Chronic kidney disease, stage 1: Secondary | ICD-10-CM | POA: Diagnosis not present

## 2021-12-01 ENCOUNTER — Other Ambulatory Visit: Payer: Self-pay | Admitting: Family Medicine

## 2021-12-05 ENCOUNTER — Other Ambulatory Visit: Payer: Self-pay | Admitting: Family Medicine

## 2021-12-06 MED ORDER — FREESTYLE LIBRE 14 DAY SENSOR MISC: 0 refills | Status: DC

## 2021-12-22 DIAGNOSIS — E1122 Type 2 diabetes mellitus with diabetic chronic kidney disease: Secondary | ICD-10-CM | POA: Diagnosis not present

## 2021-12-22 DIAGNOSIS — E1129 Type 2 diabetes mellitus with other diabetic kidney complication: Secondary | ICD-10-CM | POA: Diagnosis not present

## 2021-12-22 DIAGNOSIS — D638 Anemia in other chronic diseases classified elsewhere: Secondary | ICD-10-CM | POA: Diagnosis not present

## 2021-12-22 DIAGNOSIS — D472 Monoclonal gammopathy: Secondary | ICD-10-CM | POA: Diagnosis not present

## 2021-12-22 DIAGNOSIS — R809 Proteinuria, unspecified: Secondary | ICD-10-CM | POA: Diagnosis not present

## 2021-12-22 DIAGNOSIS — Z6831 Body mass index (BMI) 31.0-31.9, adult: Secondary | ICD-10-CM | POA: Diagnosis not present

## 2021-12-22 DIAGNOSIS — N189 Chronic kidney disease, unspecified: Secondary | ICD-10-CM | POA: Diagnosis not present

## 2021-12-22 DIAGNOSIS — K760 Fatty (change of) liver, not elsewhere classified: Secondary | ICD-10-CM | POA: Diagnosis not present

## 2021-12-22 DIAGNOSIS — R778 Other specified abnormalities of plasma proteins: Secondary | ICD-10-CM | POA: Diagnosis not present

## 2021-12-27 ENCOUNTER — Other Ambulatory Visit: Payer: Self-pay | Admitting: Family Medicine

## 2021-12-28 ENCOUNTER — Other Ambulatory Visit: Payer: Self-pay | Admitting: Family Medicine

## 2021-12-28 MED ORDER — FREESTYLE LIBRE 14 DAY SENSOR MISC: 0 refills | Status: DC

## 2021-12-29 MED ORDER — MONTELUKAST SODIUM 10 MG PO TABS: 10.0000 mg | ORAL_TABLET | Freq: Every day | ORAL | 0 refills | Status: DC

## 2022-01-12 ENCOUNTER — Encounter (HOSPITAL_COMMUNITY): Payer: Self-pay | Admitting: Hematology

## 2022-01-12 ENCOUNTER — Inpatient Hospital Stay (HOSPITAL_COMMUNITY): Payer: PPO | Attending: Hematology | Admitting: Hematology

## 2022-01-12 ENCOUNTER — Inpatient Hospital Stay (HOSPITAL_COMMUNITY): Payer: PPO

## 2022-01-12 ENCOUNTER — Other Ambulatory Visit: Payer: Self-pay | Admitting: Family Medicine

## 2022-01-12 DIAGNOSIS — Z800 Family history of malignant neoplasm of digestive organs: Secondary | ICD-10-CM | POA: Diagnosis not present

## 2022-01-12 DIAGNOSIS — Z794 Long term (current) use of insulin: Secondary | ICD-10-CM | POA: Insufficient documentation

## 2022-01-12 DIAGNOSIS — Z7984 Long term (current) use of oral hypoglycemic drugs: Secondary | ICD-10-CM | POA: Insufficient documentation

## 2022-01-12 DIAGNOSIS — R5383 Other fatigue: Secondary | ICD-10-CM | POA: Insufficient documentation

## 2022-01-12 DIAGNOSIS — E1122 Type 2 diabetes mellitus with diabetic chronic kidney disease: Secondary | ICD-10-CM | POA: Diagnosis not present

## 2022-01-12 DIAGNOSIS — I129 Hypertensive chronic kidney disease with stage 1 through stage 4 chronic kidney disease, or unspecified chronic kidney disease: Secondary | ICD-10-CM | POA: Diagnosis not present

## 2022-01-12 DIAGNOSIS — Z7982 Long term (current) use of aspirin: Secondary | ICD-10-CM | POA: Diagnosis not present

## 2022-01-12 DIAGNOSIS — N1831 Chronic kidney disease, stage 3a: Secondary | ICD-10-CM | POA: Diagnosis not present

## 2022-01-12 DIAGNOSIS — Z79899 Other long term (current) drug therapy: Secondary | ICD-10-CM | POA: Insufficient documentation

## 2022-01-12 DIAGNOSIS — R809 Proteinuria, unspecified: Secondary | ICD-10-CM | POA: Diagnosis not present

## 2022-01-12 DIAGNOSIS — R110 Nausea: Secondary | ICD-10-CM | POA: Insufficient documentation

## 2022-01-12 DIAGNOSIS — R768 Other specified abnormal immunological findings in serum: Secondary | ICD-10-CM | POA: Insufficient documentation

## 2022-01-12 DIAGNOSIS — D472 Monoclonal gammopathy: Secondary | ICD-10-CM | POA: Insufficient documentation

## 2022-01-12 LAB — CBC WITH DIFFERENTIAL/PLATELET
Abs Immature Granulocytes: 0.03 10*3/uL (ref 0.00–0.07)
Basophils Absolute: 0.1 10*3/uL (ref 0.0–0.1)
Basophils Relative: 1 %
Eosinophils Absolute: 0.3 10*3/uL (ref 0.0–0.5)
Eosinophils Relative: 3 %
HCT: 39.1 % (ref 39.0–52.0)
Hemoglobin: 12.6 g/dL — ABNORMAL LOW (ref 13.0–17.0)
Immature Granulocytes: 0 %
Lymphocytes Relative: 23 %
Lymphs Abs: 2.1 10*3/uL (ref 0.7–4.0)
MCH: 29.8 pg (ref 26.0–34.0)
MCHC: 32.2 g/dL (ref 30.0–36.0)
MCV: 92.4 fL (ref 80.0–100.0)
Monocytes Absolute: 0.9 10*3/uL (ref 0.1–1.0)
Monocytes Relative: 10 %
Neutro Abs: 5.9 10*3/uL (ref 1.7–7.7)
Neutrophils Relative %: 63 %
Platelets: 273 10*3/uL (ref 150–400)
RBC: 4.23 MIL/uL (ref 4.22–5.81)
RDW: 13.4 % (ref 11.5–15.5)
WBC: 9.2 10*3/uL (ref 4.0–10.5)
nRBC: 0 % (ref 0.0–0.2)

## 2022-01-12 LAB — COMPREHENSIVE METABOLIC PANEL
ALT: 22 U/L (ref 0–44)
AST: 25 U/L (ref 15–41)
Albumin: 4.3 g/dL (ref 3.5–5.0)
Alkaline Phosphatase: 70 U/L (ref 38–126)
Anion gap: 9 (ref 5–15)
BUN: 29 mg/dL — ABNORMAL HIGH (ref 8–23)
CO2: 25 mmol/L (ref 22–32)
Calcium: 8.9 mg/dL (ref 8.9–10.3)
Chloride: 103 mmol/L (ref 98–111)
Creatinine, Ser: 1.96 mg/dL — ABNORMAL HIGH (ref 0.61–1.24)
GFR, Estimated: 38 mL/min — ABNORMAL LOW (ref >60)
Glucose, Bld: 153 mg/dL — ABNORMAL HIGH (ref 70–99)
Potassium: 3.9 mmol/L (ref 3.5–5.1)
Sodium: 137 mmol/L (ref 135–145)
Total Bilirubin: 0.5 mg/dL (ref 0.3–1.2)
Total Protein: 7.6 g/dL (ref 6.5–8.1)

## 2022-01-12 LAB — LACTATE DEHYDROGENASE: LDH: 174 U/L (ref 98–192)

## 2022-01-12 MED ORDER — TRIAMTERENE-HCTZ 37.5-25 MG PO TABS: 1.0000 | ORAL_TABLET | Freq: Every day | ORAL | 0 refills | Status: DC

## 2022-01-12 NOTE — Progress Notes (Signed)
? ?[facility] ?[address] ?[place], [place] [address] ? ? ?CLINIC:  ?Medical Oncology/Hematology ? ?Patient Care Team: ?Fayrene Helper, MD as PCP - General (Family Medicine) ? 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 ?HISTORY OF PRESENTING ILLNESS:  ?Travis Herrera 66 y.o. 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 Theador Hawthorne. ? ?Today he reports feeling good. He reports worsened fatigue and nausea since starting Farxiga. He denies recent weight loss, fevers, night sweats, tingling/numbness, and new pains. He denies history of CVA, MI, lupus, and RA.  ? ?He currently lives at home with his wife. He works as a Hospital doctor. He denies smoking history. He had 2 half brothers with colon cancer, and his half sister had pancreatic cancer.  ? ?MEDICAL HISTORY:  ?Past Medical History:  ?Diagnosis Date  ? Diabetes mellitus without complication ([place]) 1914  ? insulin started in 2012  ? Hyperlipemia 2013  ? Hypertension 2010  ? ? ?SURGICAL HISTORY: ?Past Surgical History:  ?Procedure Laterality Date  ? CATARACT EXTRACTION Right 08/26/2021  ? CATARACT EXTRACTION W/PHACO  07/29/2012  ? Procedure: CATARACT EXTRACTION PHACO AND INTRAOCULAR LENS PLACEMENT (IOC);  Surgeon: Tonny Branch, MD;  Location: [facility];  Service: Ophthalmology;  Laterality: Left;  CDE=1.66  ? CHOLECYSTECTOMY  2007  ? [facility] Na  ? EYE SURGERY Left 2013  ? cataract  ? QUADRICEPS TENDON REPAIR Right 03/19/2018  ? Procedure: REPAIR QUADRICEP TENDON;  Surgeon: Carole Civil, MD;  Location: [facility];  Service: Orthopedics;  Laterality: Right;  ? ? ?SOCIAL HISTORY: ?Social History  ? ?Socioeconomic History  ? Marital status: Married  ?  Spouse name: Not on file  ? Number of children: 4  ? Years of education: Not on file  ? Highest education level: Not on file  ?Occupational History  ? Not on file  ?Tobacco Use  ? Smoking status: Never  ? Smokeless tobacco: Never  ?Vaping Use  ? Vaping Use: Never  used  ?Substance and Sexual Activity  ? Alcohol use: No  ? Drug use: No  ? Sexual activity: Yes  ?  Birth control/protection: None  ?Other Topics Concern  ? Not on file  ?Social History Narrative  ? Not on file  ? ?Social Determinants of Health  ? ?Financial Resource Strain: Low Risk   ? Difficulty of Paying Living Expenses: Not hard at all  ?Food Insecurity: No Food Insecurity  ? Worried About Charity fundraiser in the Last Year: Never true  ? Ran Out of Food in the Last Year: Never true  ?Transportation Needs: No Transportation Needs  ? Lack of Transportation (Medical): No  ? Lack of Transportation (Non-Medical): No  ?Physical Activity: Sufficiently Active  ? Days of Exercise per Week: 7 days  ? Minutes of Exercise per Session: 60 min  ?Stress: No Stress Concern Present  ? Feeling of Stress : Not at all  ?Social Connections: Socially Integrated  ? Frequency of Communication with Friends and Family: More than three times a week  ? Frequency of Social Gatherings with Friends and Family: More than three times a week  ? Attends Religious Services: More than 4 times per year  ? Active Member of Clubs or Organizations: Yes  ? Attends Archivist Meetings: 1 to 4 times per year  ? Marital Status: Married  ?Intimate Partner Violence: Not At Risk  ? Fear of Current or Ex-Partner: No  ? Emotionally Abused: No  ?  Physically Abused: No  ? Sexually Abused: No  ? ? ?FAMILY HISTORY: ?Family History  ?Problem Relation Age of Onset  ? Cancer Sister   ? Early death Father   ?     car accident   ? Cancer Brother   ? Hypertension Brother   ? Diabetes Brother   ? Hypertension Brother   ? ? ?ALLERGIES:  is allergic to hydrocodone and oxycodone. ? ?MEDICATIONS:  ?Current Outpatient Medications  ?Medication Sig Dispense Refill  ? amLODipine (NORVASC) 10 MG tablet Take 1 tablet (10 mg total) by mouth daily. 90 tablet 0  ? aspirin EC 81 MG tablet Take 81 mg by mouth daily.    ? benazepril (LOTENSIN) 20 MG tablet Take 1 tablet (20  mg total) by mouth daily. 90 tablet 0  ? Chlorpheniramine-DM (CORICIDIN COUGH/COLD) 4-30 MG TABS Take 1 tablet by mouth every 6 (six) hours as needed. 28 tablet 0  ? clotrimazole-betamethasone (LOTRISONE) cream Apply twice daily to affected area(s) for 1 week, then , as needed 45 g 0  ? Continuous Blood Gluc Receiver (FREESTYLE LIBRE READER) DEVI Use to check blood sugar daily dx E11.65 1 each 5  ? Continuous Blood Gluc Sensor (FREESTYLE LIBRE 14 DAY SENSOR) MISC USE TO CHECK GLUCOSE DAILY AND CHANGE EVERY 14 DAYS 2 each 0  ? Continuous Blood Gluc Sensor (FREESTYLE LIBRE SENSOR SYSTEM) MISC Use to check blood sugar daily dx E11.65 1 each 5  ? FARXIGA 5 MG TABS tablet Take 5 mg by mouth every morning.    ? FLUZONE QUADRIVALENT 0.5 ML injection     ? glipiZIDE (GLUCOTROL XL) 5 MG 24 hr tablet Take 1 tablet (5 mg total) by mouth daily with breakfast. 90 tablet 3  ? glucose blood (ONETOUCH VERIO) test strip USE 1 STRIP TO CHECK GLUCOSE THREE TIMES DAILY AS DIRECTED DX e11.65 300 each 3  ? insulin glargine (LANTUS SOLOSTAR) 100 UNIT/ML Solostar Pen Inject 50 Units into the skin daily with breakfast. 45 mL 3  ? Insulin Pen Needle 31G X 8 MM MISC 1 each by Does not apply route 2 (two) times daily. 100 each 5  ? Lancets (FREESTYLE) lancets Use as instructed bid 100 each 5  ? liraglutide (VICTOZA) 18 MG/3ML SOPN Inject 1.8 mg into the skin every morning. INJECT 1.8 MG INTO THE SKIN DAILY AS DIRECTED 9 mL 0  ? montelukast (SINGULAIR) 10 MG tablet Take 1 tablet (10 mg total) by mouth at bedtime. 90 tablet 0  ? ONETOUCH DELICA LANCETS 32R MISC Three times daily testing dx e11.65 300 each 3  ? prednisoLONE acetate (PRED FORTE) 1 % ophthalmic suspension Place into the right eye.    ? rosuvastatin (CRESTOR) 10 MG tablet Take 1 tablet by mouth once daily 90 tablet 0  ? tadalafil (CIALIS) 20 MG tablet Take 1 tablet (20 mg total) by mouth daily as needed for erectile dysfunction. 10 tablet 3  ? triamterene-hydrochlorothiazide  (MAXZIDE-25) 37.5-25 MG tablet Take 1 tablet by mouth daily. 90 tablet 0  ? ?No current facility-administered medications for this visit.  ? ? ?REVIEW OF SYSTEMS:   ?Review of Systems  ?Constitutional:  Positive for fatigue. Negative for appetite change, fever and unexpected weight change.  ?Gastrointestinal:  Positive for diarrhea and nausea.  ?Endocrine: Negative for hot flashes.  ?Musculoskeletal:  Positive for arthralgias (6/10 Knee).  ?Neurological:  Negative for numbness.  ?All other systems reviewed and are negative. ? ? ?PHYSICAL EXAMINATION: ?ECOG PERFORMANCE STATUS: 1 - Symptomatic but  completely ambulatory ? ?Vitals:  ? 01/12/22 0917  ?BP: 123/83  ?Pulse: 70  ?Resp: 18  ?Temp: (!) 97.5 ?F (36.4 ?C)  ?SpO2: 100%  ? ?Filed Weights  ? 01/12/22 0917  ?Weight: 218 lb 4 oz (99 kg)  ? ?Physical Exam ?Vitals reviewed.  ?Constitutional:   ?   Appearance: Normal appearance. He is obese.  ?Cardiovascular:  ?   Rate and Rhythm: Normal rate and regular rhythm.  ?   Pulses: Normal pulses.  ?   Heart sounds: Normal heart sounds.  ?Pulmonary:  ?   Effort: Pulmonary effort is normal.  ?   Breath sounds: Normal breath sounds.  ?Abdominal:  ?   Palpations: Abdomen is soft. There is no hepatomegaly, splenomegaly or mass.  ?   Tenderness: There is no abdominal tenderness.  ?Musculoskeletal:  ?   Right lower leg: No edema.  ?   Left lower leg: No edema.  ?Lymphadenopathy:  ?   Cervical: No cervical adenopathy.  ?   Right cervical: No superficial cervical adenopathy. ?   Left cervical: No superficial cervical adenopathy.  ?   Upper Body:  ?   Right upper body: No supraclavicular or axillary adenopathy.  ?   Left upper body: No supraclavicular or axillary adenopathy.  ?   Lower Body: No right inguinal adenopathy. No left inguinal adenopathy.  ?Neurological:  ?   General: No focal deficit present.  ?   Mental Status: He is alert and oriented to person, place, and time.  ?Psychiatric:     ?   Mood and Affect: Mood normal.     ?    Behavior: Behavior normal.  ? ? ? ?LABORATORY DATA:  ?I have reviewed the data as listed ?No results found for this or any previous visit (from the past 2160 hour(s)). ? ?RADIOGRAPHIC STUDIES: ?I have perso

## 2022-01-12 NOTE — Patient Instructions (Addendum)
[place] at [facility] ?Discharge Instructions ? ? ?You were seen and examined today by Dr. Delton Coombes. He is a blood specialist who is seeing you today at the request of your kidney doctor to determine if you have a condition called MGUS. ? ?We will obtain more blood work to investigate this further.  ? ?We will see you back in 2 weeks to discuss the results.  ? ? ?Thank you for choosing [place] at [facility] to provide your oncology and hematology care.  To afford each patient quality time with our provider, please arrive at least 15 minutes before your scheduled appointment time.  ? ?If you have a lab appointment with the [facility] please come in thru the Main Entrance and check in at the main information desk. ? ?You need to re-schedule your appointment should you arrive 10 or more minutes late.  We strive to give you quality time with our providers, and arriving late affects you and other patients whose appointments are after yours.  Also, if you no show three or more times for appointments you may be dismissed from the clinic at the providers discretion.     ?Again, thank you for choosing [facility].  Our hope is that these requests will decrease the amount of time that you wait before being seen by our physicians.       ?_____________________________________________________________ ? ?Should you have questions after your visit to [facility], please contact our office at [phone] and follow the prompts.  Our office hours are 8:00 a.m. and 4:30 p.m. Monday - Friday.  Please note that voicemails left after 4:00 p.m. may not be returned until the following business day.  We are closed weekends and major holidays.  You do have access to a nurse 24-7, just call the main number to the clinic [phone] and do not press any options, hold on the line and a nurse will answer the phone.   ? ?For prescription refill requests,  have your pharmacy contact our office and allow 72 hours.   ? ?Due to Covid, you will need to wear a mask upon entering the hospital. If you do not have a mask, a mask will be given to you at the Main Entrance upon arrival. For doctor visits, patients may have 1 support person age 2 or older with them. For treatment visits, patients can not have anyone with them due to social distancing guidelines and our immunocompromised population.  ? ?   ?

## 2022-01-13 LAB — BETA 2 MICROGLOBULIN, SERUM: Beta-2 Microglobulin: 2.7 mg/L — ABNORMAL HIGH (ref 0.6–2.4)

## 2022-01-13 LAB — KAPPA/LAMBDA LIGHT CHAINS
Kappa free light chain: 119.6 mg/L — ABNORMAL HIGH (ref 3.3–19.4)
Kappa, lambda light chain ratio: 5.92 — ABNORMAL HIGH (ref 0.26–1.65)
Lambda free light chains: 20.2 mg/L (ref 5.7–26.3)

## 2022-01-15 ENCOUNTER — Other Ambulatory Visit (HOSPITAL_COMMUNITY)
Admission: RE | Admit: 2022-01-15 | Discharge: 2022-01-15 | Disposition: A | Discharge status: Home / Self Care | Payer: PPO | Source: Other Acute Inpatient Hospital | Attending: Hematology | Admitting: Hematology

## 2022-01-15 DIAGNOSIS — R768 Other specified abnormal immunological findings in serum: Secondary | ICD-10-CM | POA: Diagnosis not present

## 2022-01-16 LAB — PROTEIN ELECTROPHORESIS, SERUM
A/G Ratio: 1.1 (ref 0.7–1.7)
Albumin ELP: 3.6 g/dL (ref 2.9–4.4)
Alpha-1-Globulin: 0.3 g/dL (ref 0.0–0.4)
Alpha-2-Globulin: 0.8 g/dL (ref 0.4–1.0)
Beta Globulin: 1 g/dL (ref 0.7–1.3)
Gamma Globulin: 1.1 g/dL (ref 0.4–1.8)
Globulin, Total: 3.2 g/dL (ref 2.2–3.9)
Total Protein ELP: 6.8 g/dL (ref 6.0–8.5)

## 2022-01-17 LAB — UIFE/LIGHT CHAINS/TP QN, 24-HR UR
FR KAPPA LT CH,24HR: 926.3 mg/24 hr
FR LAMBDA LT CH,24HR: 30.13 mg/24 hr
Free Kappa Lt Chains,Ur: 411.69 mg/L — ABNORMAL HIGH (ref 1.17–86.46)
Free Kappa/Lambda Ratio: 30.75 — ABNORMAL HIGH (ref 1.83–14.26)
Free Lambda Lt Chains,Ur: 13.39 mg/L (ref 0.27–15.21)
Total Protein, Urine-Ur/day: 158 mg/24 hr — ABNORMAL HIGH (ref 30–150)
Total Protein, Urine: 7 mg/dL
Total Volume: 2250

## 2022-01-17 LAB — IMMUNOFIXATION ELECTROPHORESIS
IgA: 240 mg/dL (ref 61–437)
IgG (Immunoglobin G), Serum: 1135 mg/dL (ref 603–1613)
IgM (Immunoglobulin M), Srm: 32 mg/dL (ref 20–172)
Total Protein ELP: 6.8 g/dL (ref 6.0–8.5)

## 2022-01-19 DIAGNOSIS — E1122 Type 2 diabetes mellitus with diabetic chronic kidney disease: Secondary | ICD-10-CM | POA: Diagnosis not present

## 2022-01-19 DIAGNOSIS — N1831 Chronic kidney disease, stage 3a: Secondary | ICD-10-CM | POA: Diagnosis not present

## 2022-01-19 DIAGNOSIS — Z794 Long term (current) use of insulin: Secondary | ICD-10-CM | POA: Diagnosis not present

## 2022-01-20 LAB — T4, FREE: Free T4: 1.36 ng/dL (ref 0.82–1.77)

## 2022-01-20 LAB — COMPREHENSIVE METABOLIC PANEL
ALT: 21 IU/L (ref 0–44)
AST: 23 IU/L (ref 0–40)
Albumin/Globulin Ratio: 1.6 (ref 1.2–2.2)
Albumin: 4.4 g/dL (ref 3.8–4.8)
Alkaline Phosphatase: 86 IU/L (ref 44–121)
BUN/Creatinine Ratio: 18 (ref 10–24)
BUN: 33 mg/dL — ABNORMAL HIGH (ref 8–27)
Bilirubin Total: 0.2 mg/dL (ref 0.0–1.2)
CO2: 21 mmol/L (ref 20–29)
Calcium: 9.3 mg/dL (ref 8.6–10.2)
Chloride: 104 mmol/L (ref 96–106)
Creatinine, Ser: 1.79 mg/dL — ABNORMAL HIGH (ref 0.76–1.27)
Globulin, Total: 2.7 g/dL (ref 1.5–4.5)
Glucose: 143 mg/dL — ABNORMAL HIGH (ref 70–99)
Potassium: 4.4 mmol/L (ref 3.5–5.2)
Sodium: 142 mmol/L (ref 134–144)
Total Protein: 7.1 g/dL (ref 6.0–8.5)
eGFR: 42 mL/min/{1.73_m2} — ABNORMAL LOW (ref >59)

## 2022-01-20 LAB — LIPID PANEL
Chol/HDL Ratio: 3.4 ratio (ref 0.0–5.0)
Cholesterol, Total: 98 mg/dL — ABNORMAL LOW (ref 100–199)
HDL: 29 mg/dL — ABNORMAL LOW (ref >39)
LDL Chol Calc (NIH): 54 mg/dL (ref 0–99)
Triglycerides: 70 mg/dL (ref 0–149)
VLDL Cholesterol Cal: 15 mg/dL (ref 5–40)

## 2022-01-20 LAB — TSH: TSH: 1.6 u[IU]/mL (ref 0.450–4.500)

## 2022-01-20 LAB — VITAMIN D 25 HYDROXY (VIT D DEFICIENCY, FRACTURES): Vit D, 25-Hydroxy: 45.2 ng/mL (ref 30.0–100.0)

## 2022-01-24 NOTE — Patient Instructions (Signed)

## 2022-01-25 ENCOUNTER — Encounter: Payer: Self-pay | Admitting: Nurse Practitioner

## 2022-01-25 ENCOUNTER — Ambulatory Visit: Payer: PPO | Admitting: Nurse Practitioner

## 2022-01-25 VITALS — BP 107/66 | HR 57 | Ht 69.5 in | Wt 218.0 lb

## 2022-01-25 DIAGNOSIS — N1832 Chronic kidney disease, stage 3b: Secondary | ICD-10-CM

## 2022-01-25 DIAGNOSIS — Z794 Long term (current) use of insulin: Secondary | ICD-10-CM | POA: Diagnosis not present

## 2022-01-25 DIAGNOSIS — E782 Mixed hyperlipidemia: Secondary | ICD-10-CM

## 2022-01-25 DIAGNOSIS — E1122 Type 2 diabetes mellitus with diabetic chronic kidney disease: Secondary | ICD-10-CM | POA: Diagnosis not present

## 2022-01-25 NOTE — Progress Notes (Signed)
? ?01/25/2022 ? ?Endocrinology follow-up note ? ? ?Subjective:  ? ? Patient ID: Travis Herrera, male    DOB: [birth year],  ? ? ?Past Medical History:  ?Diagnosis Date  ? Diabetes mellitus without complication ([place]) 7616  ? insulin started in 2012  ? Hyperlipemia 2013  ? Hypertension 2010  ? ?Past Surgical History:  ?Procedure Laterality Date  ? CATARACT EXTRACTION Right 08/26/2021  ? CATARACT EXTRACTION W/PHACO  07/29/2012  ? Procedure: CATARACT EXTRACTION PHACO AND INTRAOCULAR LENS PLACEMENT (IOC);  Surgeon: Tonny Branch, MD;  Location: [facility];  Service: Ophthalmology;  Laterality: Left;  CDE=1.66  ? CHOLECYSTECTOMY  2007  ? Forestine Na  ? EYE SURGERY Left 2013  ? cataract  ? QUADRICEPS TENDON REPAIR Right 03/19/2018  ? Procedure: REPAIR QUADRICEP TENDON;  Surgeon: Carole Civil, MD;  Location: [facility];  Service: Orthopedics;  Laterality: Right;  ? ?Social History  ? ?Socioeconomic History  ? Marital status: Married  ?  Spouse name: Not on file  ? Number of children: 4  ? Years of education: Not on file  ? Highest education level: Not on file  ?Occupational History  ? Not on file  ?Tobacco Use  ? Smoking status: Never  ? Smokeless tobacco: Never  ?Vaping Use  ? Vaping Use: Never used  ?Substance and Sexual Activity  ? Alcohol use: No  ? Drug use: No  ? Sexual activity: Yes  ?  Birth control/protection: None  ?Other Topics Concern  ? Not on file  ?Social History Narrative  ? Not on file  ? ?Social Determinants of Health  ? ?Financial Resource Strain: Low Risk   ? Difficulty of Paying Living Expenses: Not hard at all  ?Food Insecurity: No Food Insecurity  ? Worried About Charity fundraiser in the Last Year: Never true  ? Ran Out of Food in the Last Year: Never true  ?Transportation Needs: No Transportation Needs  ? Lack of Transportation (Medical): No  ? Lack of Transportation (Non-Medical): No  ?Physical Activity: Sufficiently Active  ? Days of Exercise per Week: 7 days  ? Minutes of Exercise per Session: 60 min   ?Stress: No Stress Concern Present  ? Feeling of Stress : Not at all  ?Social Connections: Socially Integrated  ? Frequency of Communication with Friends and Family: More than three times a week  ? Frequency of Social Gatherings with Friends and Family: More than three times a week  ? Attends Religious Services: More than 4 times per year  ? Active Member of Clubs or Organizations: Yes  ? Attends Archivist Meetings: 1 to 4 times per year  ? Marital Status: Married  ? ?Outpatient Encounter Medications as of 01/25/2022  ?Medication Sig  ? amLODipine (NORVASC) 10 MG tablet Take 1 tablet (10 mg total) by mouth daily.  ? aspirin EC 81 MG tablet Take 81 mg by mouth daily.  ? benazepril (LOTENSIN) 20 MG tablet Take 1 tablet (20 mg total) by mouth daily.  ? Chlorpheniramine-DM (CORICIDIN COUGH/COLD) 4-30 MG TABS Take 1 tablet by mouth every 6 (six) hours as needed.  ? clotrimazole-betamethasone (LOTRISONE) cream Apply twice daily to affected area(s) for 1 week, then , as needed  ? Continuous Blood Gluc Receiver (FREESTYLE LIBRE READER) DEVI Use to check blood sugar daily dx E11.65  ? Continuous Blood Gluc Sensor (FREESTYLE LIBRE 14 DAY SENSOR) MISC USE TO CHECK GLUCOSE DAILY AND CHANGE EVERY 14 DAYS  ? Continuous Blood Gluc Sensor (FREESTYLE LIBRE SENSOR  SYSTEM) MISC Use to check blood sugar daily dx E11.65  ? FARXIGA 5 MG TABS tablet Take 5 mg by mouth every morning.  ? FLUZONE QUADRIVALENT 0.5 ML injection   ? glipiZIDE (GLUCOTROL XL) 5 MG 24 hr tablet Take 1 tablet (5 mg total) by mouth daily with breakfast.  ? glucose blood (ONETOUCH VERIO) test strip USE 1 STRIP TO CHECK GLUCOSE THREE TIMES DAILY AS DIRECTED DX e11.65  ? insulin glargine (LANTUS SOLOSTAR) 100 UNIT/ML Solostar Pen Inject 50 Units into the skin daily with breakfast.  ? Insulin Pen Needle 31G X 8 MM MISC 1 each by Does not apply route 2 (two) times daily.  ? Lancets (FREESTYLE) lancets Use as instructed bid  ? liraglutide (VICTOZA) 18 MG/3ML  SOPN Inject 1.8 mg into the skin every morning. INJECT 1.8 MG INTO THE SKIN DAILY AS DIRECTED  ? montelukast (SINGULAIR) 10 MG tablet Take 1 tablet (10 mg total) by mouth at bedtime.  ? ONETOUCH DELICA LANCETS 52D MISC Three times daily testing dx e11.65  ? prednisoLONE acetate (PRED FORTE) 1 % ophthalmic suspension Place into the right eye.  ? rosuvastatin (CRESTOR) 10 MG tablet Take 1 tablet by mouth once daily  ? tadalafil (CIALIS) 20 MG tablet Take 1 tablet (20 mg total) by mouth daily as needed for erectile dysfunction.  ? triamterene-hydrochlorothiazide (MAXZIDE-25) 37.5-25 MG tablet Take 1 tablet by mouth daily.  ? ?No facility-administered encounter medications on file as of 01/25/2022.  ? ?ALLERGIES: ?Allergies  ?Allergen Reactions  ? Hydrocodone   ? Oxycodone   ? ?VACCINATION STATUS: ?Immunization History  ?Administered Date(s) Administered  ? Influenza Split 07/17/2014  ? Influenza,inj,Quad PF,6+ Mos 08/16/2015, 07/26/2016, 08/02/2017, 07/19/2018, 06/16/2019, 07/02/2020  ? Influenza-Unspecified 08/02/2021  ? Moderna Sars-Covid-2 Vaccination 12/27/2019, 01/28/2020  ? PFIZER(Purple Top)SARS-COV-2 Vaccination 09/24/2020  ? PNEUMOCOCCAL CONJUGATE-20 10/26/2021  ? Pneumococcal Polysaccharide-23 09/15/2019  ? ? ?Diabetes ?He presents for his follow-up diabetic visit. He has type 2 diabetes mellitus. Onset time: He was diagnosed at approximate age of 34 years. His disease course has been stable. There are no hypoglycemic associated symptoms. Pertinent negatives for hypoglycemia include no confusion, headaches, pallor or seizures. Pertinent negatives for diabetes include no chest pain, no fatigue, no polydipsia, no polyphagia, no polyuria and no weakness. There are no hypoglycemic complications. Symptoms are stable. Diabetic complications include nephropathy. Risk factors for coronary artery disease include diabetes mellitus, dyslipidemia, hypertension, family history, male sex, sedentary lifestyle and obesity.  Current diabetic treatment includes insulin injections and oral agent (dual therapy) (and Victoza). He is compliant with treatment most of the time. His weight is fluctuating minimally. He is following a generally healthy diet. When asked about meal planning, he reported none. He has not had a previous visit with a dietitian. He rarely participates in exercise. His home blood glucose trend is fluctuating minimally. His breakfast blood glucose range is generally 90-110 mg/dl. His lunch blood glucose range is generally 140-180 mg/dl. His dinner blood glucose range is generally 140-180 mg/dl. His bedtime blood glucose range is generally 140-180 mg/dl. His overall blood glucose range is 140-180 mg/dl. (He presents today with his CGM and logs showing at target glycemic profile overall.  His previsit A1c, drawn at nephrologist, was 8.3%, increasing from previous visit.  Analysis of his CGM shows TIR 77%, TAR 23%, TBR 0% with a GMI of 6.9% and glucose variability of 27.7%.  He does have mild fasting hypoglycemia at times.  He did have steroid injection in between visits in his  knee.) An ACE inhibitor/angiotensin II receptor blocker is being taken. He does not see a podiatrist.Eye exam is current.  ?Hypertension ?This is a chronic problem. The current episode started more than 1 year ago. The problem has been gradually improving since onset. The problem is controlled. Pertinent negatives include no chest pain, headaches, neck pain, palpitations or shortness of breath. There are no associated agents to hypertension. Risk factors for coronary artery disease include dyslipidemia, diabetes mellitus, male gender, obesity and sedentary lifestyle. Past treatments include ACE inhibitors, diuretics and calcium channel blockers. The current treatment provides moderate improvement. There are no compliance problems.  Hypertensive end-organ damage includes kidney disease. Identifiable causes of hypertension include chronic renal  disease.  ?Hyperlipidemia ?This is a chronic problem. The current episode started more than 1 year ago. The problem is controlled. Recent lipid tests were reviewed and are normal. Exacerbating diseases include c

## 2022-01-27 ENCOUNTER — Encounter: Payer: Self-pay | Admitting: Family Medicine

## 2022-01-30 ENCOUNTER — Inpatient Hospital Stay (HOSPITAL_COMMUNITY): Payer: PPO | Attending: Hematology | Admitting: Hematology

## 2022-01-30 ENCOUNTER — Encounter (HOSPITAL_COMMUNITY): Payer: Self-pay | Admitting: Hematology

## 2022-01-30 ENCOUNTER — Other Ambulatory Visit: Payer: Self-pay | Admitting: Family Medicine

## 2022-01-30 ENCOUNTER — Ambulatory Visit (HOSPITAL_COMMUNITY)
Admission: RE | Admit: 2022-01-30 | Discharge: 2022-01-30 | Disposition: A | Discharge status: Home / Self Care | Payer: PPO | Source: Ambulatory Visit | Attending: Hematology | Admitting: Hematology

## 2022-01-30 VITALS — BP 114/69 | HR 67 | Temp 97.6°F | Resp 20 | Ht 68.5 in | Wt 216.3 lb

## 2022-01-30 DIAGNOSIS — Z7982 Long term (current) use of aspirin: Secondary | ICD-10-CM | POA: Diagnosis not present

## 2022-01-30 DIAGNOSIS — Z7984 Long term (current) use of oral hypoglycemic drugs: Secondary | ICD-10-CM | POA: Diagnosis not present

## 2022-01-30 DIAGNOSIS — D472 Monoclonal gammopathy: Secondary | ICD-10-CM | POA: Insufficient documentation

## 2022-01-30 DIAGNOSIS — E1122 Type 2 diabetes mellitus with diabetic chronic kidney disease: Secondary | ICD-10-CM | POA: Diagnosis not present

## 2022-01-30 DIAGNOSIS — R768 Other specified abnormal immunological findings in serum: Secondary | ICD-10-CM | POA: Insufficient documentation

## 2022-01-30 DIAGNOSIS — I129 Hypertensive chronic kidney disease with stage 1 through stage 4 chronic kidney disease, or unspecified chronic kidney disease: Secondary | ICD-10-CM | POA: Insufficient documentation

## 2022-01-30 DIAGNOSIS — Z7985 Long-term (current) use of injectable non-insulin antidiabetic drugs: Secondary | ICD-10-CM | POA: Insufficient documentation

## 2022-01-30 DIAGNOSIS — N1831 Chronic kidney disease, stage 3a: Secondary | ICD-10-CM | POA: Insufficient documentation

## 2022-01-30 DIAGNOSIS — Z794 Long term (current) use of insulin: Secondary | ICD-10-CM | POA: Insufficient documentation

## 2022-01-30 DIAGNOSIS — Z79899 Other long term (current) drug therapy: Secondary | ICD-10-CM | POA: Insufficient documentation

## 2022-01-30 NOTE — Progress Notes (Signed)
? ?[place] ?[address] ?[place], [place] [address] ? ? ?CLINIC:  ?Medical Oncology/Hematology ? ?PCP:  ?Fayrene Helper, MD ?[address] [place] / [place] [address]  ?[phone] ? ?REASON FOR VISIT:  ?Follow-up for MGUS ? ?PRIOR THERAPY: none ? ?CURRENT THERAPY: under work-up ? ?INTERVAL HISTORY:  ?Travis Herrera, a 66 y.o. 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 Lavalle was last seen on 01/12/2022. ? ?Today he reports feeling good. He is hesitant about moving forward with a bone marrow biopsy at this time.  ? ?REVIEW OF SYSTEMS:  ?Review of Systems  ?Constitutional:  Negative for appetite change and fatigue.  ?Musculoskeletal:  Positive for arthralgias (7/10 L knee).  ?All other systems reviewed and are negative. ? ?PAST MEDICAL/SURGICAL HISTORY:  ?Past Medical History:  ?Diagnosis Date  ? Diabetes mellitus without complication ([place]) 2956  ? insulin started in 2012  ? Hyperlipemia 2013  ? Hypertension 2010  ? ?Past Surgical History:  ?Procedure Laterality Date  ? CATARACT EXTRACTION Right 08/26/2021  ? CATARACT EXTRACTION W/PHACO  07/29/2012  ? Procedure: CATARACT EXTRACTION PHACO AND INTRAOCULAR LENS PLACEMENT (IOC);  Surgeon: Tonny Branch, MD;  Location: [facility];  Service: Ophthalmology;  Laterality: Left;  CDE=1.66  ? CHOLECYSTECTOMY  2007  ? [place] Na  ? EYE SURGERY Left 2013  ? cataract  ? QUADRICEPS TENDON REPAIR Right 03/19/2018  ? Procedure: REPAIR QUADRICEP TENDON;  Surgeon: Carole Civil, MD;  Location: [facility];  Service: Orthopedics;  Laterality: Right;  ? ? ?SOCIAL HISTORY:  ?Social History  ? ?Socioeconomic History  ? Marital status: Married  ?  Spouse name: Not on file  ? Number of children: 4  ? Years of education: Not on file  ? Highest education level: Not on file  ?Occupational History  ? Not on file  ?Tobacco Use  ? Smoking status: Never  ? Smokeless tobacco: Never  ?Vaping Use  ? Vaping Use: Never used  ?Substance and Sexual Activity  ? Alcohol  use: No  ? Drug use: No  ? Sexual activity: Yes  ?  Birth control/protection: None  ?Other Topics Concern  ? Not on file  ?Social History Narrative  ? Not on file  ? ?Social Determinants of Health  ? ?Financial Resource Strain: Low Risk   ? Difficulty of Paying Living Expenses: Not hard at all  ?Food Insecurity: No Food Insecurity  ? Worried About Charity fundraiser in the Last Year: Never true  ? Ran Out of Food in the Last Year: Never true  ?Transportation Needs: No Transportation Needs  ? Lack of Transportation (Medical): No  ? Lack of Transportation (Non-Medical): No  ?Physical Activity: Sufficiently Active  ? Days of Exercise per Week: 7 days  ? Minutes of Exercise per Session: 60 min  ?Stress: No Stress Concern Present  ? Feeling of Stress : Not at all  ?Social Connections: Socially Integrated  ? Frequency of Communication with Friends and Family: More than three times a week  ? Frequency of Social Gatherings with Friends and Family: More than three times a week  ? Attends Religious Services: More than 4 times per year  ? Active Member of Clubs or Organizations: Yes  ? Attends Archivist Meetings: 1 to 4 times per year  ? Marital Status: Married  ?Intimate Partner Violence: Not At Risk  ? Fear of Current or Ex-Partner: No  ? Emotionally Abused: No  ? Physically Abused: No  ?  Sexually Abused: No  ? ? ?FAMILY HISTORY:  ?Family History  ?Problem Relation Age of Onset  ? Cancer Sister   ? Early death Father   ?     car accident   ? Cancer Brother   ? Hypertension Brother   ? Diabetes Brother   ? Hypertension Brother   ? ? ?CURRENT MEDICATIONS:  ?Current Outpatient Medications  ?Medication Sig Dispense Refill  ? amLODipine (NORVASC) 10 MG tablet Take 1 tablet (10 mg total) by mouth daily. 90 tablet 0  ? aspirin EC 81 MG tablet Take 81 mg by mouth daily.    ? benazepril (LOTENSIN) 20 MG tablet Take 1 tablet (20 mg total) by mouth daily. 90 tablet 0  ? Chlorpheniramine-DM (CORICIDIN COUGH/COLD) 4-30 MG  TABS Take 1 tablet by mouth every 6 (six) hours as needed. 28 tablet 0  ? clotrimazole-betamethasone (LOTRISONE) cream Apply twice daily to affected area(s) for 1 week, then , as needed 45 g 0  ? Continuous Blood Gluc Receiver (FREESTYLE LIBRE READER) DEVI Use to check blood sugar daily dx E11.65 1 each 5  ? Continuous Blood Gluc Sensor (FREESTYLE LIBRE 14 DAY SENSOR) MISC USE TO CHECK GLUCOSE DAILY AND CHANGE EVERY 14 DAYS 2 each 0  ? Continuous Blood Gluc Sensor (FREESTYLE LIBRE SENSOR SYSTEM) MISC Use to check blood sugar daily dx E11.65 1 each 5  ? FARXIGA 5 MG TABS tablet Take 5 mg by mouth every morning.    ? FLUZONE QUADRIVALENT 0.5 ML injection     ? glipiZIDE (GLUCOTROL XL) 5 MG 24 hr tablet Take 1 tablet (5 mg total) by mouth daily with breakfast. 90 tablet 3  ? glucose blood (ONETOUCH VERIO) test strip USE 1 STRIP TO CHECK GLUCOSE THREE TIMES DAILY AS DIRECTED DX e11.65 300 each 3  ? insulin glargine (LANTUS SOLOSTAR) 100 UNIT/ML Solostar Pen Inject 50 Units into the skin daily with breakfast. 45 mL 3  ? Insulin Pen Needle 31G X 8 MM MISC 1 each by Does not apply route 2 (two) times daily. 100 each 5  ? Lancets (FREESTYLE) lancets Use as instructed bid 100 each 5  ? liraglutide (VICTOZA) 18 MG/3ML SOPN Inject 1.8 mg into the skin every morning. INJECT 1.8 MG INTO THE SKIN DAILY AS DIRECTED 9 mL 0  ? montelukast (SINGULAIR) 10 MG tablet Take 1 tablet (10 mg total) by mouth at bedtime. 90 tablet 0  ? ONETOUCH DELICA LANCETS 01V MISC Three times daily testing dx e11.65 300 each 3  ? prednisoLONE acetate (PRED FORTE) 1 % ophthalmic suspension Place into the right eye.    ? rosuvastatin (CRESTOR) 10 MG tablet Take 1 tablet by mouth once daily 90 tablet 0  ? tadalafil (CIALIS) 20 MG tablet Take 1 tablet (20 mg total) by mouth daily as needed for erectile dysfunction. 10 tablet 3  ? triamterene-hydrochlorothiazide (MAXZIDE-25) 37.5-25 MG tablet Take 1 tablet by mouth daily. 90 tablet 0  ? ?No current  facility-administered medications for this visit.  ? ? ?ALLERGIES:  ?Allergies  ?Allergen Reactions  ? Hydrocodone   ? Oxycodone   ? ? ?PHYSICAL EXAM:  ?Performance status (ECOG): 1 - Symptomatic but completely ambulatory ? ?Vitals:  ? 01/30/22 1106 01/30/22 1115  ?BP: 114/69 114/69  ?Pulse: 67 67  ?Resp:  20  ?Temp: 97.6 ?F (36.4 ?C) 97.6 ?F (36.4 ?C)  ?SpO2: 100% 100%  ? ?Wt Readings from Last 3 Encounters:  ?01/30/22 216 lb 4.3 oz (98.1 kg)  ?01/25/22 218  lb (98.9 kg)  ?01/12/22 218 lb 4 oz (99 kg)  ? ?Physical Exam ?Vitals reviewed.  ?Constitutional:   ?   Appearance: Normal appearance. He is obese.  ?Cardiovascular:  ?   Rate and Rhythm: Normal rate and regular rhythm.  ?   Pulses: Normal pulses.  ?   Heart sounds: Normal heart sounds.  ?Pulmonary:  ?   Effort: Pulmonary effort is normal.  ?   Breath sounds: Normal breath sounds.  ?Neurological:  ?   General: No focal deficit present.  ?   Mental Status: He is alert and oriented to person, place, and time.  ?Psychiatric:     ?   Mood and Affect: Mood normal.     ?   Behavior: Behavior normal.  ? ? ?LABORATORY DATA:  ?I have reviewed the labs as listed.  ? ?  Latest Ref Rng & Units 01/12/2022  ? 10:54 AM 01/17/2021  ?  8:30 AM 07/13/2020  ?  8:40 AM  ?CBC  ?WBC 4.0 - 10.5 K/uL 9.2   9.3   8.4    ?Hemoglobin 13.0 - 17.0 g/dL 12.6   12.4   12.3    ?Hematocrit 39.0 - 52.0 % 39.1   36.7   37.0    ?Platelets 150 - 400 K/uL 273   295   265    ? ? ?  Latest Ref Rng & Units 01/19/2022  ?  8:14 AM 01/12/2022  ? 10:54 AM 07/06/2021  ?  8:43 AM  ?CMP  ?Glucose 70 - 99 mg/dL 143   153   231    ?BUN 8 - 27 mg/dL 33   29   23    ?Creatinine 0.76 - 1.27 mg/dL 1.79   1.96   1.54    ?Sodium 134 - 144 mmol/L 142   137   139    ?Potassium 3.5 - 5.2 mmol/L 4.4   3.9   4.4    ?Chloride 96 - 106 mmol/L 104   103   102    ?CO2 20 - 29 mmol/L _0 ?Calcium 8.6 - 10.2 mg/dL 9.3   8.9   9.2    ?Total Protein 6.0 - 8.5 g/dL 7.1   7.6   6.5    ?Total Bilirubin 0.0 - 1.2 mg/dL 0.2   0.5    0.3    ?Alkaline Phos 44 - 121 IU/L 86   70   86    ?AST 0 - 40 IU/L _1 ?ALT 0 - 44 IU/L _2 ? ?   ?Component Value Date/Time  ? RBC 4.23 01/12/2022 1054  ? MCV 92.4 01/12/2022 1054  ? [place]

## 2022-01-30 NOTE — Patient Instructions (Signed)
[place] at [facility] ?Discharge Instructions ? ? ?You were seen and examined today by Dr. Delton Coombes. ? ?He reviewed the results of your lab work.   ? ?We can watch and wait on this condition if Dr. Theador Hawthorne is in agreement. ? ?If anything gets worse, we recommend doing a bone marrow biopsy to investigate this further.  ? ?We will also arrange for you to have a bone scan.  ? ?Return as scheduled.  ? ? ?Thank you for choosing [place] at [facility] to provide your oncology and hematology care.  To afford each patient quality time with our provider, please arrive at least 15 minutes before your scheduled appointment time.  ? ?If you have a lab appointment with the [facility] please come in thru the Main Entrance and check in at the main information desk. ? ?You need to re-schedule your appointment should you arrive 10 or more minutes late.  We strive to give you quality time with our providers, and arriving late affects you and other patients whose appointments are after yours.  Also, if you no show three or more times for appointments you may be dismissed from the clinic at the providers discretion.     ?Again, thank you for choosing [facility].  Our hope is that these requests will decrease the amount of time that you wait before being seen by our physicians.       ?_____________________________________________________________ ? ?Should you have questions after your visit to [facility], please contact our office at [phone] and follow the prompts.  Our office hours are 8:00 a.m. and 4:30 p.m. Monday - Friday.  Please note that voicemails left after 4:00 p.m. may not be returned until the following business day.  We are closed weekends and major holidays.  You do have access to a nurse 24-7, just call the main number to the clinic [phone] and do not press any options, hold on the line and a nurse will answer the phone.    ? ?For prescription refill requests, have your pharmacy contact our office and allow 72 hours.   ? ?Due to Covid, you will need to wear a mask upon entering the hospital. If you do not have a mask, a mask will be given to you at the Main Entrance upon arrival. For doctor visits, patients may have 1 support person age 36 or older with them. For treatment visits, patients can not have anyone with them due to social distancing guidelines and our immunocompromised population.  ? ?   ?

## 2022-01-31 ENCOUNTER — Encounter: Payer: Self-pay | Admitting: Family Medicine

## 2022-01-31 ENCOUNTER — Ambulatory Visit (INDEPENDENT_AMBULATORY_CARE_PROVIDER_SITE_OTHER): Payer: PPO | Admitting: Family Medicine

## 2022-01-31 ENCOUNTER — Other Ambulatory Visit: Payer: Self-pay | Admitting: Family Medicine

## 2022-01-31 VITALS — BP 117/74 | HR 67 | Ht 69.0 in | Wt 215.1 lb

## 2022-01-31 DIAGNOSIS — M1732 Unilateral post-traumatic osteoarthritis, left knee: Secondary | ICD-10-CM | POA: Diagnosis not present

## 2022-01-31 DIAGNOSIS — Z7689 Persons encountering health services in other specified circumstances: Secondary | ICD-10-CM | POA: Insufficient documentation

## 2022-01-31 MED ORDER — FREESTYLE LIBRE 14 DAY SENSOR MISC: 0 refills | Status: DC

## 2022-01-31 NOTE — Assessment & Plan Note (Signed)
Disabling pain , swelling and limitation in movement of left knee, requiores power scooter flop safe mobility and improved quality of life ?

## 2022-01-31 NOTE — Progress Notes (Signed)
? ?  Travis Herrera     MRN: [id number]      DOB: [birth year] ? ? ?HPI ?Travis Herrera is here for a face to face evaluation for a power scooter  for safe mobility both inside and outside of his home ?It will provide independence and improved quality of liofe. ?Patient has and stage arthritis in his left knee and has already had right quadriceps repair after injury following a fall in 03/2018 ?Left knee is permanently swollen and deformed, and is painful, also buckles , he is unable to stand or walk safely for 10 minutes or more. ?A motorized wheelchair is needed for improved quality of his life and for his safe mobility ?No other concerns at this visit ?  ? ?ROS ?Marland KitchenSee HPI ? ?Chronic severe left knee pain, swelling and limitation in mobility. ? ?PE ? ?BP 117/74   Pulse 67   Ht '5\' 9"'$  (1.753 m)   Wt 215 lb 1.9 oz (97.6 kg)   SpO2 97%   BMI 31.77 kg/m?  ? ?Patient alert and oriented and in no cardiopulmonary distress. ? ?HEENT: No facial asymmetry, EOMI,     Neck supple . ? ?Chest: Clear to auscultation bilaterally. ? ?CVS: S1, S2 no murmurs, no S3.Regular rate. ? ?ABD: Soft non tender.  ? ? ?MS: Markedly reduced rOM left knee , with crepitus and tender to palpation, mmarkedly deformed ?Skin: Intact, no ulcerations or rash noted. ? ? ?Assessment & Plan ? ?Osteoarthritis of left knee ?Disabling pain , swelling and limitation in movement of left knee, requiores power scooter flop safe mobility and improved quality of life ? ?

## 2022-01-31 NOTE — Patient Instructions (Signed)
F/U as before, call if you need me sooner ? ?You do qualify for power scooter based on exam ? ?Thanks for choosing [facility], we consider it a privelige to serve you. ? ?

## 2022-02-01 ENCOUNTER — Other Ambulatory Visit: Payer: Self-pay | Admitting: *Deleted

## 2022-02-01 MED ORDER — BENAZEPRIL HCL 20 MG PO TABS: 20.0000 mg | ORAL_TABLET | Freq: Every day | ORAL | 0 refills | Status: DC

## 2022-02-03 ENCOUNTER — Other Ambulatory Visit: Payer: Self-pay

## 2022-02-07 ENCOUNTER — Other Ambulatory Visit: Payer: Self-pay

## 2022-02-07 MED ORDER — UNABLE TO FIND: 0 refills | Status: DC

## 2022-02-14 ENCOUNTER — Other Ambulatory Visit: Payer: Self-pay | Admitting: Family Medicine

## 2022-02-14 MED ORDER — AMLODIPINE BESYLATE 10 MG PO TABS: 10.0000 mg | ORAL_TABLET | Freq: Every day | ORAL | 0 refills | Status: DC

## 2022-02-28 ENCOUNTER — Other Ambulatory Visit: Payer: Self-pay | Admitting: Family Medicine

## 2022-02-28 MED ORDER — ROSUVASTATIN CALCIUM 10 MG PO TABS: 10.0000 mg | ORAL_TABLET | Freq: Every day | ORAL | 0 refills | Status: DC

## 2022-03-06 DIAGNOSIS — E1129 Type 2 diabetes mellitus with other diabetic kidney complication: Secondary | ICD-10-CM | POA: Diagnosis not present

## 2022-03-06 DIAGNOSIS — K760 Fatty (change of) liver, not elsewhere classified: Secondary | ICD-10-CM | POA: Diagnosis not present

## 2022-03-06 DIAGNOSIS — R809 Proteinuria, unspecified: Secondary | ICD-10-CM | POA: Diagnosis not present

## 2022-03-06 DIAGNOSIS — E1122 Type 2 diabetes mellitus with diabetic chronic kidney disease: Secondary | ICD-10-CM | POA: Diagnosis not present

## 2022-03-06 DIAGNOSIS — N189 Chronic kidney disease, unspecified: Secondary | ICD-10-CM | POA: Diagnosis not present

## 2022-03-06 DIAGNOSIS — D638 Anemia in other chronic diseases classified elsewhere: Secondary | ICD-10-CM | POA: Diagnosis not present

## 2022-03-06 DIAGNOSIS — Z6831 Body mass index (BMI) 31.0-31.9, adult: Secondary | ICD-10-CM | POA: Diagnosis not present

## 2022-03-06 DIAGNOSIS — D472 Monoclonal gammopathy: Secondary | ICD-10-CM | POA: Diagnosis not present

## 2022-03-06 DIAGNOSIS — R778 Other specified abnormalities of plasma proteins: Secondary | ICD-10-CM | POA: Diagnosis not present

## 2022-03-08 ENCOUNTER — Other Ambulatory Visit: Payer: Self-pay | Admitting: Family Medicine

## 2022-03-08 ENCOUNTER — Telehealth: Payer: Self-pay | Admitting: Nurse Practitioner

## 2022-03-08 DIAGNOSIS — E1122 Type 2 diabetes mellitus with diabetic chronic kidney disease: Secondary | ICD-10-CM | POA: Diagnosis not present

## 2022-03-08 DIAGNOSIS — R809 Proteinuria, unspecified: Secondary | ICD-10-CM | POA: Diagnosis not present

## 2022-03-08 DIAGNOSIS — D638 Anemia in other chronic diseases classified elsewhere: Secondary | ICD-10-CM | POA: Diagnosis not present

## 2022-03-08 DIAGNOSIS — N189 Chronic kidney disease, unspecified: Secondary | ICD-10-CM | POA: Diagnosis not present

## 2022-03-08 DIAGNOSIS — E1129 Type 2 diabetes mellitus with other diabetic kidney complication: Secondary | ICD-10-CM | POA: Diagnosis not present

## 2022-03-08 DIAGNOSIS — D472 Monoclonal gammopathy: Secondary | ICD-10-CM | POA: Diagnosis not present

## 2022-03-08 DIAGNOSIS — I129 Hypertensive chronic kidney disease with stage 1 through stage 4 chronic kidney disease, or unspecified chronic kidney disease: Secondary | ICD-10-CM | POA: Diagnosis not present

## 2022-03-08 MED ORDER — FREESTYLE LIBRE 14 DAY SENSOR MISC
4 refills | Status: DC
Start: 2022-03-08 — End: 2022-10-18

## 2022-03-08 NOTE — Telephone Encounter (Signed)
Vials are fine to use if that is what they have available.

## 2022-03-08 NOTE — Telephone Encounter (Signed)
Pt is calling and states the health dept sent [place] a fax about they can not get the lantus pen, but can get the vials in. Would like approval to do this. Please advise.

## 2022-03-09 ENCOUNTER — Telehealth: Payer: Self-pay

## 2022-03-09 ENCOUNTER — Other Ambulatory Visit: Payer: Self-pay

## 2022-03-09 DIAGNOSIS — Z794 Long term (current) use of insulin: Secondary | ICD-10-CM

## 2022-03-09 MED ORDER — INSULIN GLARGINE 100 UNIT/ML ~~LOC~~ SOLN: 50.0000 [IU] | Freq: Every day | SUBCUTANEOUS | 1 refills | Status: DC

## 2022-03-09 NOTE — Telephone Encounter (Signed)
Disability License Plate Form  Copied Noted sleeved

## 2022-03-15 ENCOUNTER — Telehealth: Payer: Self-pay | Admitting: Nurse Practitioner

## 2022-03-15 NOTE — Telephone Encounter (Signed)
We received these forms yesterday. We will sign these today and send back.

## 2022-03-15 NOTE — Telephone Encounter (Signed)
Pt called and states the health department called and has his Lantus in stock but states they faxed [place] a paper that needs to be signed and sent back before he can get it.

## 2022-03-16 NOTE — Telephone Encounter (Signed)
Called patient will pick up forms

## 2022-03-22 ENCOUNTER — Telehealth: Payer: Self-pay | Admitting: *Deleted

## 2022-03-22 ENCOUNTER — Other Ambulatory Visit: Payer: Self-pay

## 2022-03-22 NOTE — Patient Outreach (Signed)
[name] called needing help with his medication.   The Primary Care Physician is using [facility] services.   I have sent a referral to [facility] team.    [name], [place], [place] Management [phone]

## 2022-03-23 ENCOUNTER — Other Ambulatory Visit: Payer: Self-pay | Admitting: Family Medicine

## 2022-03-23 DIAGNOSIS — E118 Type 2 diabetes mellitus with unspecified complications: Secondary | ICD-10-CM

## 2022-03-23 DIAGNOSIS — Z599 Problem related to housing and economic circumstances, unspecified: Secondary | ICD-10-CM

## 2022-03-23 MED ORDER — VICTOZA 18 MG/3ML ~~LOC~~ SOPN: 1.8000 mg | PEN_INJECTOR | Freq: Every day | SUBCUTANEOUS | 3 refills | Status: DC

## 2022-03-23 NOTE — Progress Notes (Signed)
Referred to pharmacy for med assist, also needs script for victoza sent, this is printed

## 2022-03-23 NOTE — Chronic Care Management (AMB) (Signed)
RE: Pharmacy referral Received: [name], [name], [place] Thank you!!        Previous Messages    ----- Message -----  From: Osvaldo Shipper, [place]  Sent: 03/22/2022   3:46 PM EDT  To: [name]  Subject: RE: Pharmacy referral                           Travis Herrera is no longer at [facility] he has been gone for a while I called pt spoke with him and outreached to Dr. Moshe Cipro for assistance with a referral so we can get a RX and medication assistance.   Erline Levine  ----- Message -----  From: [name]  Sent: 03/22/2022   1:18 PM EDT  To: [facility]  Subject: Pharmacy referral                               Received a call from Travis Herrera.  He is needing help with his Victoza he states that he is completely out.    His PCP is Dr. Tula Nakayama at [facility].    The Pharmacist according to the spread sheet is Gerre Pebbles email address is [email or web address]   I'm putting this as a high due to he is completely out.     Spoke with patient who clarified that he needed help with cost and new RX for Victoza. Patient aware of referral placed.    Message sent to Dr. Moshe Cipro who placed referral for Pharmacy. Routed Referral to [facility]  Direct Dial: [phone]

## 2022-03-27 ENCOUNTER — Telehealth: Payer: Self-pay | Admitting: Family Medicine

## 2022-03-27 NOTE — Telephone Encounter (Signed)
Bethany with [facility] called in regards to the messages from last week about py medication. Wants to know if you can please get In touch with her?   Call back [phone]

## 2022-03-27 NOTE — Telephone Encounter (Signed)
Can you let her know that the victoza is done by Rayetta Pigg Endo --Maybe her office has samples

## 2022-03-28 ENCOUNTER — Telehealth: Payer: Self-pay

## 2022-03-28 ENCOUNTER — Telehealth: Payer: Self-pay | Admitting: Pharmacy Technician

## 2022-03-28 DIAGNOSIS — Z596 Low income: Secondary | ICD-10-CM

## 2022-03-28 NOTE — Progress Notes (Signed)
[facility])                                            [place] Team    03/28/2022  LYNNE RIGHI 1956-05-25 [id number]                                      Medication Assistance Referral  Referral From: [place]   Medication/Company: Wilder Glade / [employer] Patient application portion:  Mailed Provider application portion: Faxed  to Rayetta Pigg, NP Provider address/fax verified via: Office website  Medication/Company: Lantus / [employer] Patient application portion:  Education officer, museum portion: Faxed  to Rayetta Pigg, NP Provider address/fax verified via: Office website  Medication/Company: Donna Bernard / [employer] Patient application portion:  Education officer, museum portion: Faxed  to Rayetta Pigg, NP Provider address/fax verified via: [employer]. Jakaylah Schlafer, [place]  [phone]

## 2022-03-28 NOTE — Progress Notes (Signed)
[facility])                                            [place] Team    03/28/2022  Travis Herrera 1955/12/03 [id number]  Travis Herrera returned my call this morning regarding his medication assistance follow-up. The patient qualifies for patient assistance for Farxiga, Victoza, and Lantus. My medication assistance specialist and I will be assisting Travis Herrera with the application process and will provide updates upon status change.   In the interim, Travis Herrera is willing to pay for a one-month supply of these medications from his local pharmacy.   Please feel free to contact me with any questions or concerns.   Thanks,  Reed Breech, [place] [phone]

## 2022-04-03 ENCOUNTER — Other Ambulatory Visit: Payer: Self-pay | Admitting: Family Medicine

## 2022-04-04 MED ORDER — MONTELUKAST SODIUM 10 MG PO TABS: 10.0000 mg | ORAL_TABLET | Freq: Every day | ORAL | 0 refills | Status: DC

## 2022-04-11 ENCOUNTER — Telehealth: Payer: Self-pay | Admitting: Pharmacy Technician

## 2022-04-11 DIAGNOSIS — Z596 Low income: Secondary | ICD-10-CM

## 2022-04-11 NOTE — Progress Notes (Signed)
[facility])                                            [facility] Quality Pharmacy Team    04/11/2022  Travis Herrera 08-03-1956 [id number]  Received both patient and provider portion(s) of patient assistance application(s) for Lantus, Victoza and Farxiga. Faxed completed application and required documents into [employer], [employer] and [employer] respectively.   Bridey Brookover P. Keryl Gholson, CPhT [facility] [employer]  [phone]

## 2022-04-19 ENCOUNTER — Other Ambulatory Visit: Payer: Self-pay | Admitting: Family Medicine

## 2022-04-19 ENCOUNTER — Telehealth: Payer: Self-pay | Admitting: Nurse Practitioner

## 2022-04-19 ENCOUNTER — Other Ambulatory Visit: Payer: Self-pay

## 2022-04-19 MED ORDER — VICTOZA 18 MG/3ML ~~LOC~~ SOPN: 1.8000 mg | PEN_INJECTOR | Freq: Every day | SUBCUTANEOUS | 0 refills | Status: DC

## 2022-04-19 MED ORDER — TRIAMTERENE-HCTZ 37.5-25 MG PO TABS: 1.0000 | ORAL_TABLET | Freq: Every day | ORAL | 0 refills | Status: DC

## 2022-04-19 MED ORDER — FARXIGA 5 MG PO TABS
5.0000 mg | ORAL_TABLET | Freq: Every morning | ORAL | 0 refills | Status: DC
  Filled 2022-04-19: qty 30, 30d supply, fill #0

## 2022-04-19 NOTE — Telephone Encounter (Signed)
Rx sent 

## 2022-04-19 NOTE — Telephone Encounter (Signed)
Patient left a Vm stating that the health dept no longer carries his Victoza and the clinical pharmacist at [facility] will be taking over that but in the mean time he needs a one month supply sent to [facility]

## 2022-04-20 NOTE — Telephone Encounter (Signed)
Submitted through cover my meds

## 2022-04-20 NOTE — Telephone Encounter (Signed)
Pt said that he needs a PA on this. Please Advise

## 2022-04-21 ENCOUNTER — Telehealth: Payer: Self-pay | Admitting: Pharmacy Technician

## 2022-04-21 DIAGNOSIS — Z596 Low income: Secondary | ICD-10-CM

## 2022-04-21 NOTE — Progress Notes (Signed)
[facility])                                            [place] Team    04/21/2022  VADHIR MCNAY [birth year] [id number]  Three care coordination calls placed to patient assistance companies [employer] in regard to Lantus application, [employer] in regard to [employer] application and [employer] in regard to [place] application.  Spoke to [place] at [place] who informs patient was APPROVED 04/12/22-10/15/22 for Lantus. She informed 3 boxes were shipped out on 04/14/22 to the provider's office and to allow 5-7 business days for delivery. She informs patient is set up for auto fill and next shipment should occur in about 3 months and the patient and providers office would receive a fax notifying them of the next shipment. Nothing has to be done on provider's part unless dose is changed. Then they can call [employer] and request a refill change form to be faxed to the provider's office.  Spoke to [place] at [employer] who informs patient was APPROVED 04/16/22-/10/15/22 for Victoza. She informs 5 boxes began processing on 04/17/22 for delivery to the provider's office in the next 10-14 business days from the 3rd. Patient's id number is [id number]. Patient is set up on auto fill and nothing has to be done on provider's part unless dose is changed. Then they can call [employer] and request a refill change form to be faxed to the provider's office.  Spoke to Dede at [place] who informs patient was APPROVED 04/12/22-10/15/22 for [place]. She informs medication should be delivered 04/26/22 at the patient's home. Patient is set up on ato fill and nothing has to be done on provider's part unless dose is changed. If dose is changed then a new prescription would need to be called in or faxed to [employer].  Patient is aware of his approvals and has already received Lantus.  Daijon Wenke P. Ashiyah Pavlak, [place]  [phone]

## 2022-04-25 ENCOUNTER — Ambulatory Visit (INDEPENDENT_AMBULATORY_CARE_PROVIDER_SITE_OTHER): Payer: PPO | Admitting: Family Medicine

## 2022-04-25 ENCOUNTER — Encounter: Payer: Self-pay | Admitting: Family Medicine

## 2022-04-25 VITALS — BP 108/66 | HR 73 | Ht 69.0 in | Wt 215.1 lb

## 2022-04-25 DIAGNOSIS — Z794 Long term (current) use of insulin: Secondary | ICD-10-CM

## 2022-04-25 DIAGNOSIS — E1122 Type 2 diabetes mellitus with diabetic chronic kidney disease: Secondary | ICD-10-CM | POA: Diagnosis not present

## 2022-04-25 DIAGNOSIS — I10 Essential (primary) hypertension: Secondary | ICD-10-CM | POA: Diagnosis not present

## 2022-04-25 DIAGNOSIS — E669 Obesity, unspecified: Secondary | ICD-10-CM | POA: Diagnosis not present

## 2022-04-25 DIAGNOSIS — Z1211 Encounter for screening for malignant neoplasm of colon: Secondary | ICD-10-CM | POA: Diagnosis not present

## 2022-04-25 DIAGNOSIS — N182 Chronic kidney disease, stage 2 (mild): Secondary | ICD-10-CM

## 2022-04-25 DIAGNOSIS — E782 Mixed hyperlipidemia: Secondary | ICD-10-CM

## 2022-04-25 NOTE — Assessment & Plan Note (Signed)
Controlled, no change in medication DASH diet and commitment to daily physical activity for a minimum of 30 minutes discussed and encouraged, as a part of hypertension management. The importance of attaining a healthy weight is also discussed.     04/25/2022    8:06 AM 01/31/2022    8:05 AM 01/30/2022   11:15 AM 01/30/2022   11:06 AM 01/25/2022    8:23 AM 01/12/2022    9:17 AM 10/26/2021    8:43 AM  BP/Weight  Systolic BP 497 530 051 102 111 735 670  Diastolic BP 66 74 69 69 66 83 82  Wt. (Lbs) 215.12 215.12 216.27  218 218.25   BMI 31.77 kg/m2 31.77 kg/m2 32.4 kg/m2  31.73 kg/m2 31.77 kg/m2

## 2022-04-25 NOTE — Assessment & Plan Note (Signed)
Travis Herrera is reminded of the importance of commitment to daily physical activity for 30 minutes or more, as able and the need to limit carbohydrate intake to 30 to 60 grams per meal to help with blood sugar control.   The need to take medication as prescribed, test blood sugar as directed, and to call between visits if there is a concern that blood sugar is uncontrolled is also discussed.   Travis Herrera is reminded of the importance of daily foot exam, annual eye examination, and good blood sugar, blood pressure and cholesterol control.     Latest Ref Rng & Units 01/19/2022    8:14 AM 01/12/2022   10:54 AM 11/24/2021   12:00 AM 09/26/2021    8:51 AM 07/06/2021    8:43 AM  Diabetic Labs  HbA1c    8.3     7.6    Chol 100 - 199 mg/dL 98       HDL >39 mg/dL 29       Calc LDL 0 - 99 mg/dL 54       Triglycerides 0 - 149 mg/dL 70       Creatinine 0.76 - 1.27 mg/dL 1.79  1.96    1.54      This result is from an external source.      04/25/2022    8:06 AM 01/31/2022    8:05 AM 01/30/2022   11:15 AM 01/30/2022   11:06 AM 01/25/2022    8:23 AM 01/12/2022    9:17 AM 10/26/2021    8:43 AM  BP/Weight  Systolic BP 326 712 458 099 833 825 053  Diastolic BP 66 74 69 69 66 83 82  Wt. (Lbs) 215.12 215.12 216.27  218 218.25   BMI 31.77 kg/m2 31.77 kg/m2 32.4 kg/m2  31.73 kg/m2 31.77 kg/m2       05/27/2021    8:30 AM 05/19/2020    8:30 AM  Foot/eye exam completion dates  Foot Form Completion Done Done      Not at goal per lab but pt reports improvement in recent times, Endo to follow up

## 2022-04-25 NOTE — Assessment & Plan Note (Signed)
  Patient re-educated about  the importance of commitment to a  minimum of 150 minutes of exercise per week as able.  The importance of healthy food choices with portion control discussed, as well as eating regularly and within a 12 hour window most days. The need to choose "clean , green" food 50 to 75% of the time is discussed, as well as to make water the primary drink and set a goal of 64 ounces water daily.       04/25/2022    8:06 AM 01/31/2022    8:05 AM 01/30/2022   11:15 AM  Weight /BMI  Weight 215 lb 1.9 oz 215 lb 1.9 oz 216 lb 4.3 oz  Height '5\' 9"'$  (1.753 m) '5\' 9"'$  (1.753 m) 5' 8.5" (1.74 m)  BMI 31.77 kg/m2 31.77 kg/m2 32.4 kg/m2

## 2022-04-25 NOTE — Assessment & Plan Note (Signed)
Hyperlipidemia:Low fat diet discussed and encouraged.   Lipid Panel  Lab Results  Component Value Date   CHOL 98 (L) 01/19/2022   HDL 29 (L) 01/19/2022   LDLCALC 54 01/19/2022   TRIG 70 01/19/2022   CHOLHDL 3.4 01/19/2022     Updated lab needed at/ before next visit.  

## 2022-04-25 NOTE — Progress Notes (Signed)
HARUO Herrera     MRN: [id number]      DOB: 1955/12/11   HPI [name] is here for follow up and re-evaluation of chronic medical conditions, medication management and review of any available recent lab and radiology data.  Preventive health is updated, specifically  Cancer screening and Immunization.   Questions or concerns regarding consultations or procedures which the PT has had in the interim are  addressed. The PT denies any adverse reactions to current medications since the last visit.  There are no new concerns.  There are no specific complaints  Denies polyuria, polydipsia, blurred vision , or hypoglycemic episodes.   ROS Denies recent fever or chills. Denies sinus pressure, nasal congestion, ear pain or sore throat. Denies chest congestion, productive cough or wheezing. Denies chest pains, palpitations and leg swelling Denies abdominal pain, nausea, vomiting,diarrhea or constipation.   Denies dysuria, frequency, hesitancy or incontinence. Cronic  joint pain, swelling and limitation in mobility. Denies headaches, seizures, numbness, or tingling. Denies depression, anxiety or insomnia. Denies skin break down or rash.   PE  BP 108/66   Pulse 73   Ht '5\' 9"'$  (1.753 m)   Wt 215 lb 1.9 oz (97.6 kg)   SpO2 97%   BMI 31.77 kg/m   Patient alert and oriented and in no cardiopulmonary distress.  HEENT: No facial asymmetry, EOMI,     Neck supple .  Chest: Clear to auscultation bilaterally.  CVS: S1, S2 no murmurs, no S3.Regular rate.  ABD: Soft non tender.   Ext: No edema  MS: Adequate ROM spine, shoulders, hips and reduced in  knees.  Skin: Intact, no ulcerations or rash noted.  Psych: Good eye contact, normal affect. Memory intact not anxious or depressed appearing.  CNS: CN 2-12 intact, power,  normal throughout.no focal deficits noted.   Assessment & Plan  Benign hypertension Controlled, no change in medication DASH diet and commitment to daily  physical activity for a minimum of 30 minutes discussed and encouraged, as a part of hypertension management. The importance of attaining a healthy weight is also discussed.     04/25/2022    8:06 AM 01/31/2022    8:05 AM 01/30/2022   11:15 AM 01/30/2022   11:06 AM 01/25/2022    8:23 AM 01/12/2022    9:17 AM 10/26/2021    8:43 AM  BP/Weight  Systolic BP 376 283 151 761 607 371 062  Diastolic BP 66 74 69 69 66 83 82  Wt. (Lbs) 215.12 215.12 216.27  218 218.25   BMI 31.77 kg/m2 31.77 kg/m2 32.4 kg/m2  31.73 kg/m2 31.77 kg/m2        Mixed hyperlipidemia Hyperlipidemia:Low fat diet discussed and encouraged.   Lipid Panel  Lab Results  Component Value Date   CHOL 98 (L) 01/19/2022   HDL 29 (L) 01/19/2022   LDLCALC 54 01/19/2022   TRIG 70 01/19/2022   CHOLHDL 3.4 01/19/2022     Updated lab needed at/ before next visit.   Obesity (BMI 30.0-34.9)  Patient re-educated about  the importance of commitment to a  minimum of 150 minutes of exercise per week as able.  The importance of healthy food choices with portion control discussed, as well as eating regularly and within a 12 hour window most days. The need to choose "clean , green" food 50 to 75% of the time is discussed, as well as to make water the primary drink and set a goal of 64 ounces water daily.  04/25/2022    8:06 AM 01/31/2022    8:05 AM 01/30/2022   11:15 AM  Weight /BMI  Weight 215 lb 1.9 oz 215 lb 1.9 oz 216 lb 4.3 oz  Height '5\' 9"'$  (1.753 m) '5\' 9"'$  (1.753 m) 5' 8.5" (1.74 m)  BMI 31.77 kg/m2 31.77 kg/m2 32.4 kg/m2      Type 2 diabetes mellitus with stage 2 chronic kidney disease, with long-term current use of insulin [facility]) [name] is reminded of the importance of commitment to daily physical activity for 30 minutes or more, as able and the need to limit carbohydrate intake to 30 to 60 grams per meal to help with blood sugar control.   The need to take medication as prescribed, test blood sugar as  directed, and to call between visits if there is a concern that blood sugar is uncontrolled is also discussed.   [name] is reminded of the importance of daily foot exam, annual eye examination, and good blood sugar, blood pressure and cholesterol control.     Latest Ref Rng & Units 01/19/2022    8:14 AM 01/12/2022   10:54 AM 11/24/2021   12:00 AM 09/26/2021    8:51 AM 07/06/2021    8:43 AM  Diabetic Labs  HbA1c    8.3     7.6    Chol 100 - 199 mg/dL 98       HDL >39 mg/dL 29       Calc LDL 0 - 99 mg/dL 54       Triglycerides 0 - 149 mg/dL 70       Creatinine 0.76 - 1.27 mg/dL 1.79  1.96    1.54      This result is from an external source.      04/25/2022    8:06 AM 01/31/2022    8:05 AM 01/30/2022   11:15 AM 01/30/2022   11:06 AM 01/25/2022    8:23 AM 01/12/2022    9:17 AM 10/26/2021    8:43 AM  BP/Weight  Systolic BP 103 013 143 888 757 972 820  Diastolic BP 66 74 69 69 66 83 82  Wt. (Lbs) 215.12 215.12 216.27  218 218.25   BMI 31.77 kg/m2 31.77 kg/m2 32.4 kg/m2  31.73 kg/m2 31.77 kg/m2       05/27/2021    8:30 AM 05/19/2020    8:30 AM  Foot/eye exam completion dates  Foot Form Completion Done Done      Not at goal per lab but pt reports improvement in recent times, Endo to follow up

## 2022-04-25 NOTE — Patient Instructions (Signed)
Annual exam in mid January , call if you need me before  Flu vaccine in September, nurse visit  Nurse please arrange cologuard test  PLEASE DO get vaccines which are past due  Nurse please send for recent eye exam ( [facility])  Fasting lipid, cmp and eGFR in October  It is important that you exercise regularly at least 30 minutes 5 times a week. If you develop chest pain, have severe difficulty breathing, or feel very tired, stop exercising immediately and seek medical attention  Think about what you will eat, plan ahead. Choose " clean, green, fresh or frozen" over canned, processed or packaged foods which are more sugary, salty and fatty. 70 to 75% of food eaten should be vegetables and fruit. Three meals at set times with snacks allowed between meals, but they must be fruit or vegetables. Aim to eat over a 12 hour period , example 7 am to 7 pm, and STOP after  your last meal of the day. Drink water,generally about 64 ounces per day, no other drink is as healthy. Fruit juice is best enjoyed in a healthy way, by EATING the fruit. Thanks for choosing [facility], we consider it a privelige to serve you.

## 2022-04-27 ENCOUNTER — Other Ambulatory Visit: Payer: Self-pay | Admitting: Family Medicine

## 2022-04-27 ENCOUNTER — Other Ambulatory Visit: Payer: Self-pay

## 2022-04-27 MED ORDER — BENAZEPRIL HCL 20 MG PO TABS: 20.0000 mg | ORAL_TABLET | Freq: Every day | ORAL | 1 refills | Status: DC

## 2022-05-01 DIAGNOSIS — H35033 Hypertensive retinopathy, bilateral: Secondary | ICD-10-CM | POA: Diagnosis not present

## 2022-05-01 LAB — HM DIABETES EYE EXAM

## 2022-05-03 DIAGNOSIS — Z1211 Encounter for screening for malignant neoplasm of colon: Secondary | ICD-10-CM | POA: Diagnosis not present

## 2022-05-10 LAB — COLOGUARD: COLOGUARD: NEGATIVE

## 2022-05-21 ENCOUNTER — Telehealth: Payer: PPO | Admitting: Nurse Practitioner

## 2022-05-21 ENCOUNTER — Ambulatory Visit: Payer: Self-pay

## 2022-05-21 DIAGNOSIS — U071 COVID-19: Secondary | ICD-10-CM

## 2022-05-21 MED ORDER — MOLNUPIRAVIR EUA 200MG CAPSULE
4.0000 | ORAL_CAPSULE | Freq: Two times a day (BID) | ORAL | 0 refills | Status: AC
Start: 2022-05-21 — End: 2022-05-26

## 2022-05-21 NOTE — Progress Notes (Signed)
Virtual Visit Consent   Travis Herrera, you are scheduled for a virtual visit with a [facility] provider today. Just as with appointments in the office, your consent must be obtained to participate. Your consent will be active for this visit and any virtual visit you may have with one of our providers in the next 365 days. If you have a MyChart account, a copy of this consent can be sent to you electronically.  As this is a virtual visit, video technology does not allow for your provider to perform a traditional examination. This may limit your provider's ability to fully assess your condition. If your provider identifies any concerns that need to be evaluated in person or the need to arrange testing (such as labs, EKG, etc.), we will make arrangements to do so. Although advances in technology are sophisticated, we cannot ensure that it will always work on either your end or our end. If the connection with a video visit is poor, the visit may have to be switched to a telephone visit. With either a video or telephone visit, we are not always able to ensure that we have a secure connection.  By engaging in this virtual visit, you consent to the provision of healthcare and authorize for your insurance to be billed (if applicable) for the services provided during this visit. Depending on your insurance coverage, you may receive a charge related to this service.  I need to obtain your verbal consent now. Are you willing to proceed with your visit today? Travis Herrera has provided verbal consent on 05/21/2022 for a virtual visit (video or telephone). Gildardo Pounds, NP  Date: 05/21/2022 9:19 AM  Virtual Visit via Video Note   I, Gildardo Pounds, connected with  Travis Herrera  ([id number], [birth year]) on 05/21/22 at  8:30 AM EDT by a video-enabled telemedicine application and verified that I am speaking with the correct person using two identifiers.  Location: Patient: Virtual Visit Location Patient:  Home Provider: Virtual Visit Location Provider: Home Office   I discussed the limitations of evaluation and management by telemedicine and the availability of in person appointments. The patient expressed understanding and agreed to proceed.    History of Present Illness: Travis Herrera is a 66 y.o. who identifies as a male who was assigned male at birth, and is being seen today for COVID positive.  Travis Herrera went to the beach this week. Started not feeling well 2 days ago with the following symptoms: chills, low grade fever (did not have thermometer but wife states he felt warm), cough, headache. Tested positive for COVID via home test over the past 24 hours.    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   COVID-19 03/29/2021   Seasonal allergies 03/12/2019   Quadriceps tendon rupture, right, subsequent encounter s/p repair 03/19/18    Annual physical exam 07/26/2016   Benign hypertension 07/27/2015   Osteoarthritis of left knee 05/05/2015   Erectile dysfunction 10/01/2014   Mixed hyperlipidemia 05/03/2014   Obesity (BMI 30.0-34.9) 05/03/2014   Type 2 diabetes mellitus with stage 2 chronic kidney disease, with long-term current use of insulin ([facility]) 12/08/2013    Allergies:  Allergies  Allergen Reactions   Hydrocodone    Oxycodone    Medications:  Current Outpatient Medications:    molnupiravir EUA (LAGEVRIO) 200 mg CAPS capsule, Take 4 capsules (  800 mg total) by mouth 2 (two) times daily for 5 days., Disp: 40 capsule, Rfl: 0   amLODipine (NORVASC) 10 MG tablet, Take 1 tablet (10 mg total) by mouth daily., Disp: 90 tablet, Rfl: 0   aspirin EC 81 MG tablet, Take 81 mg by mouth daily., Disp: , Rfl:    benazepril (LOTENSIN) 20 MG tablet, Take 1 tablet (20 mg total) by mouth daily., Disp: 90 tablet, Rfl: 1   Continuous Blood Gluc Receiver (FREESTYLE LIBRE  READER) DEVI, Use to check blood sugar daily dx E11.65, Disp: 1 each, Rfl: 5   Continuous Blood Gluc Sensor (FREESTYLE LIBRE 14 DAY SENSOR) MISC, USE TO CHECK GLUCOSE DAILY AND CHANGE EVERY 14 DAYS, Disp: 2 each, Rfl: 4   Continuous Blood Gluc Sensor ([place]) MISC, Use to check blood sugar daily dx E11.65, Disp: 1 each, Rfl: 5   FARXIGA 5 MG TABS tablet, Take 1 tablet (5 mg total) by mouth every morning., Disp: 30 tablet, Rfl: 0   FLUZONE QUADRIVALENT 0.5 ML injection, , Disp: , Rfl:    glipiZIDE (GLUCOTROL XL) 5 MG 24 hr tablet, Take 1 tablet (5 mg total) by mouth daily with breakfast., Disp: 90 tablet, Rfl: 3   glucose blood (ONETOUCH VERIO) test strip, USE 1 STRIP TO CHECK GLUCOSE THREE TIMES DAILY AS DIRECTED DX e11.65, Disp: 300 each, Rfl: 3   insulin glargine (LANTUS SOLOSTAR) 100 UNIT/ML Solostar Pen, Inject 50 Units into the skin daily with breakfast., Disp: 45 mL, Rfl: 3   insulin glargine (LANTUS) 100 UNIT/ML injection, Inject 0.5 mLs (50 Units total) into the skin at bedtime., Disp: 10 mL, Rfl: 1   Insulin Pen Needle 31G X 8 MM MISC, 1 each by Does not apply route 2 (two) times daily., Disp: 100 each, Rfl: 5   Lancets (FREESTYLE) lancets, Use as instructed bid, Disp: 100 each, Rfl: 5   liraglutide (VICTOZA) 18 MG/3ML SOPN, Inject 1.8 mg into the skin every morning. INJECT 1.8 MG INTO THE SKIN DAILY AS DIRECTED, Disp: 9 mL, Rfl: 0   liraglutide (VICTOZA) 18 MG/3ML SOPN, Inject 1.8 mg into the skin daily., Disp: 3 mL, Rfl: 0   montelukast (SINGULAIR) 10 MG tablet, Take 1 tablet (10 mg total) by mouth at bedtime., Disp: 90 tablet, Rfl: 0   ONETOUCH DELICA LANCETS 15V MISC, Three times daily testing dx e11.65, Disp: 300 each, Rfl: 3   rosuvastatin (CRESTOR) 10 MG tablet, Take 1 tablet (10 mg total) by mouth daily., Disp: 90 tablet, Rfl: 0   triamterene-hydrochlorothiazide (MAXZIDE-25) 37.5-25 MG tablet, Take 1 tablet by mouth daily., Disp: 90 tablet, Rfl: 0   UNABLE TO  FIND, Med Name: Power scooter  DX: M17.12, Disp: 1 each, Rfl: 0   vitamin C (ASCORBIC ACID) 500 MG tablet, Take 500 mg by mouth daily., Disp: , Rfl:   Observations/Objective: Patient is well-developed, well-nourished in no acute distress.  Resting comfortably at home.  Head is normocephalic, atraumatic.  No labored breathing.  Speech is clear and coherent with logical content.  Patient is alert and oriented at baseline.    Assessment and Plan: 1. Positive self-administered antigen test for COVID-19 - molnupiravir EUA (LAGEVRIO) 200 mg CAPS capsule; Take 4 capsules (800 mg total) by mouth 2 (two) times daily for 5 days.  Dispense: 40 capsule; Refill: 0  INSTRUCTIONS: use a humidifier for nasal congestion Drink plenty of fluids, rest and wash hands frequently to avoid the spread of infection Alternate tylenol and Motrin for  relief of fever   Follow Up Instructions: I discussed the assessment and treatment plan with the patient. The patient was provided an opportunity to ask questions and all were answered. The patient agreed with the plan and demonstrated an understanding of the instructions.  A copy of instructions were sent to the patient via MyChart unless otherwise noted below.    The patient was advised to call back or seek an in-person evaluation if the symptoms worsen or if the condition fails to improve as anticipated.  Time:  I spent 11 minutes with the patient via telehealth technology discussing the above problems/concerns.    Gildardo Pounds, NP

## 2022-05-21 NOTE — Patient Instructions (Signed)
Travis Herrera, thank you for joining Gildardo Pounds, NP for today's virtual visit.  While this provider is not your primary care provider (PCP), if your PCP is located in our provider database this encounter information will be shared with them immediately following your visit.  Consent: (Patient) Travis Herrera provided verbal consent for this virtual visit at the beginning of the encounter.  Current Medications:  Current Outpatient Medications:    molnupiravir EUA (LAGEVRIO) 200 mg CAPS capsule, Take 4 capsules (800 mg total) by mouth 2 (two) times daily for 5 days., Disp: 40 capsule, Rfl: 0   amLODipine (NORVASC) 10 MG tablet, Take 1 tablet (10 mg total) by mouth daily., Disp: 90 tablet, Rfl: 0   aspirin EC 81 MG tablet, Take 81 mg by mouth daily., Disp: , Rfl:    benazepril (LOTENSIN) 20 MG tablet, Take 1 tablet (20 mg total) by mouth daily., Disp: 90 tablet, Rfl: 1   Continuous Blood Gluc Receiver (FREESTYLE LIBRE READER) DEVI, Use to check blood sugar daily dx E11.65, Disp: 1 each, Rfl: 5   Continuous Blood Gluc Sensor (FREESTYLE LIBRE 14 DAY SENSOR) MISC, USE TO CHECK GLUCOSE DAILY AND CHANGE EVERY 14 DAYS, Disp: 2 each, Rfl: 4   Continuous Blood Gluc Sensor ([place]) MISC, Use to check blood sugar daily dx E11.65, Disp: 1 each, Rfl: 5   FARXIGA 5 MG TABS tablet, Take 1 tablet (5 mg total) by mouth every morning., Disp: 30 tablet, Rfl: 0   FLUZONE QUADRIVALENT 0.5 ML injection, , Disp: , Rfl:    glipiZIDE (GLUCOTROL XL) 5 MG 24 hr tablet, Take 1 tablet (5 mg total) by mouth daily with breakfast., Disp: 90 tablet, Rfl: 3   glucose blood (ONETOUCH VERIO) test strip, USE 1 STRIP TO CHECK GLUCOSE THREE TIMES DAILY AS DIRECTED DX e11.65, Disp: 300 each, Rfl: 3   insulin glargine (LANTUS SOLOSTAR) 100 UNIT/ML Solostar Pen, Inject 50 Units into the skin daily with breakfast., Disp: 45 mL, Rfl: 3   insulin glargine (LANTUS) 100 UNIT/ML injection, Inject 0.5 mLs (50  Units total) into the skin at bedtime., Disp: 10 mL, Rfl: 1   Insulin Pen Needle 31G X 8 MM MISC, 1 each by Does not apply route 2 (two) times daily., Disp: 100 each, Rfl: 5   Lancets (FREESTYLE) lancets, Use as instructed bid, Disp: 100 each, Rfl: 5   liraglutide (VICTOZA) 18 MG/3ML SOPN, Inject 1.8 mg into the skin every morning. INJECT 1.8 MG INTO THE SKIN DAILY AS DIRECTED, Disp: 9 mL, Rfl: 0   liraglutide (VICTOZA) 18 MG/3ML SOPN, Inject 1.8 mg into the skin daily., Disp: 3 mL, Rfl: 0   montelukast (SINGULAIR) 10 MG tablet, Take 1 tablet (10 mg total) by mouth at bedtime., Disp: 90 tablet, Rfl: 0   ONETOUCH DELICA LANCETS 00F MISC, Three times daily testing dx e11.65, Disp: 300 each, Rfl: 3   rosuvastatin (CRESTOR) 10 MG tablet, Take 1 tablet (10 mg total) by mouth daily., Disp: 90 tablet, Rfl: 0   triamterene-hydrochlorothiazide (MAXZIDE-25) 37.5-25 MG tablet, Take 1 tablet by mouth daily., Disp: 90 tablet, Rfl: 0   UNABLE TO FIND, Med Name: Power scooter  DX: M17.12, Disp: 1 each, Rfl: 0   vitamin C (ASCORBIC ACID) 500 MG tablet, Take 500 mg by mouth daily., Disp: , Rfl:    Medications ordered in this encounter:  Meds ordered this encounter  Medications   molnupiravir EUA (LAGEVRIO) 200 mg CAPS capsule  Sig: Take 4 capsules (800 mg total) by mouth 2 (two) times daily for 5 days.    Dispense:  40 capsule    Refill:  0    Order Specific Question:   Supervising Provider    Answer:   Sabra Heck, [place]     *If you need refills on other medications prior to your next appointment, please contact your pharmacy*  Follow-Up: Call back or seek an in-person evaluation if the symptoms worsen or if the condition fails to improve as anticipated.  Other Instructions INSTRUCTIONS: use a humidifier for nasal congestion Drink plenty of fluids, rest and wash hands frequently to avoid the spread of infection Alternate tylenol and Motrin for relief of fever  Coricidin HBP for cough   If you  have been instructed to have an in-person evaluation today at a local Urgent Care facility, please use the link below. It will take you to a list of all of our available [facility], including address, phone number and hours of operation. Please do not delay care.  [facility]  If you or a family member do not have a primary care provider, use the link below to schedule a visit and establish care. When you choose a [facility] primary care physician or advanced practice provider, you gain a long-term partner in health. Find a Primary Care Provider  Learn more about [facility] in-office and virtual care options: [facility]

## 2022-05-24 DIAGNOSIS — Z20822 Contact with and (suspected) exposure to covid-19: Secondary | ICD-10-CM | POA: Diagnosis not present

## 2022-05-25 DIAGNOSIS — Z794 Long term (current) use of insulin: Secondary | ICD-10-CM | POA: Diagnosis not present

## 2022-05-25 DIAGNOSIS — I10 Essential (primary) hypertension: Secondary | ICD-10-CM | POA: Diagnosis not present

## 2022-05-25 DIAGNOSIS — H43811 Vitreous degeneration, right eye: Secondary | ICD-10-CM | POA: Diagnosis not present

## 2022-05-25 DIAGNOSIS — H35031 Hypertensive retinopathy, right eye: Secondary | ICD-10-CM | POA: Diagnosis not present

## 2022-05-25 DIAGNOSIS — H43812 Vitreous degeneration, left eye: Secondary | ICD-10-CM | POA: Diagnosis not present

## 2022-05-25 DIAGNOSIS — H35033 Hypertensive retinopathy, bilateral: Secondary | ICD-10-CM | POA: Diagnosis not present

## 2022-05-25 DIAGNOSIS — H5310 Unspecified subjective visual disturbances: Secondary | ICD-10-CM | POA: Diagnosis not present

## 2022-05-25 DIAGNOSIS — Z20822 Contact with and (suspected) exposure to covid-19: Secondary | ICD-10-CM | POA: Diagnosis not present

## 2022-05-25 DIAGNOSIS — H35032 Hypertensive retinopathy, left eye: Secondary | ICD-10-CM | POA: Diagnosis not present

## 2022-05-25 DIAGNOSIS — Z961 Presence of intraocular lens: Secondary | ICD-10-CM | POA: Diagnosis not present

## 2022-05-25 DIAGNOSIS — E119 Type 2 diabetes mellitus without complications: Secondary | ICD-10-CM | POA: Diagnosis not present

## 2022-05-28 DIAGNOSIS — Z20822 Contact with and (suspected) exposure to covid-19: Secondary | ICD-10-CM | POA: Diagnosis not present

## 2022-05-29 DIAGNOSIS — Z20822 Contact with and (suspected) exposure to covid-19: Secondary | ICD-10-CM | POA: Diagnosis not present

## 2022-05-30 ENCOUNTER — Encounter: Payer: Self-pay | Admitting: Nurse Practitioner

## 2022-05-30 ENCOUNTER — Ambulatory Visit: Payer: PPO | Admitting: Nurse Practitioner

## 2022-05-30 VITALS — BP 114/71 | HR 64 | Ht 69.0 in | Wt 217.0 lb

## 2022-05-30 DIAGNOSIS — E782 Mixed hyperlipidemia: Secondary | ICD-10-CM | POA: Diagnosis not present

## 2022-05-30 DIAGNOSIS — N1832 Chronic kidney disease, stage 3b: Secondary | ICD-10-CM

## 2022-05-30 DIAGNOSIS — E1122 Type 2 diabetes mellitus with diabetic chronic kidney disease: Secondary | ICD-10-CM | POA: Diagnosis not present

## 2022-05-30 DIAGNOSIS — Z794 Long term (current) use of insulin: Secondary | ICD-10-CM | POA: Diagnosis not present

## 2022-05-30 DIAGNOSIS — I10 Essential (primary) hypertension: Secondary | ICD-10-CM | POA: Diagnosis not present

## 2022-05-30 LAB — POCT GLYCOSYLATED HEMOGLOBIN (HGB A1C): HbA1c POC (<> result, manual entry): 7.5 % (ref 4.0–5.6)

## 2022-05-30 NOTE — Progress Notes (Signed)
05/30/2022  Endocrinology follow-up note   Subjective:    Patient ID: Travis Herrera, male    DOB: 04/16/1956,    Past Medical History:  Diagnosis Date   Diabetes mellitus without complication ([place]) 5188   insulin started in 2012   Hyperlipemia 2013   Hypertension 2010   Past Surgical History:  Procedure Laterality Date   CATARACT EXTRACTION Right 08/26/2021   CATARACT EXTRACTION W/PHACO  07/29/2012   Procedure: CATARACT EXTRACTION PHACO AND INTRAOCULAR LENS PLACEMENT ([place]);  Surgeon: Tonny Branch, MD;  Location: [facility];  Service: Ophthalmology;  Laterality: Left;  CDE=1.66   CHOLECYSTECTOMY  2007   [place]   EYE SURGERY Left 2013   cataract   QUADRICEPS TENDON REPAIR Right 03/19/2018   Procedure: REPAIR QUADRICEP TENDON;  Surgeon: Carole Civil, MD;  Location: [facility];  Service: Orthopedics;  Laterality: Right;   Social History   Socioeconomic History   Marital status: Married    Spouse name: Not on file   Number of children: 4   Years of education: Not on file   Highest education level: Not on file  Occupational History   Not on file  Tobacco Use   Smoking status: Never   Smokeless tobacco: Never  Vaping Use   Vaping Use: Never used  Substance and Sexual Activity   Alcohol use: No   Drug use: No   Sexual activity: Yes    Birth control/protection: None  Other Topics Concern   Not on file  Social History Narrative   Not on file   Social Determinants of Health   Financial Resource Strain: Low Risk  (08/16/2021)   Overall Financial Resource Strain (CARDIA)    Difficulty of Paying Living Expenses: Not hard at all  Food Insecurity: No Food Insecurity (08/16/2021)   Hunger Vital Sign    Worried About Running Out of Food in the Last Year: Never true    [place] in the Last Year: Never true  Transportation Needs: No Transportation Needs (08/16/2021)   PRAPARE - Hydrologist (Medical): No    Lack of Transportation  (Non-Medical): No  Physical Activity: Sufficiently Active (08/16/2021)   Exercise Vital Sign    Days of Exercise per Week: 7 days    Minutes of Exercise per Session: 60 min  Stress: No Stress Concern Present (08/16/2021)   [place]    Feeling of Stress : Not at all  Social Connections: [place] (08/16/2021)   Social Connection and Isolation Panel [NHANES]    Frequency of Communication with Friends and Family: More than three times a week    Frequency of Social Gatherings with Friends and Family: More than three times a week    Attends Religious Services: More than 4 times per year    Active Member of [employer] or Organizations: Yes    Attends Archivist Meetings: 1 to 4 times per year    Marital Status: Married   Outpatient Encounter Medications as of 05/30/2022  Medication Sig   amLODipine (NORVASC) 10 MG tablet Take 1 tablet (10 mg total) by mouth daily.   aspirin EC 81 MG tablet Take 81 mg by mouth daily.   benazepril (LOTENSIN) 20 MG tablet Take 1 tablet (20 mg total) by mouth daily.   Continuous Blood Gluc Receiver (FREESTYLE LIBRE READER) DEVI Use to check blood sugar daily dx E11.65   Continuous Blood Gluc Sensor (FREESTYLE  LIBRE 14 DAY SENSOR) MISC USE TO CHECK GLUCOSE DAILY AND CHANGE EVERY 14 DAYS   Continuous Blood Gluc Sensor (FREESTYLE LIBRE SENSOR SYSTEM) MISC Use to check blood sugar daily dx E11.65   FARXIGA 5 MG TABS tablet Take 1 tablet (5 mg total) by mouth every morning.   glucose blood (ONETOUCH VERIO) test strip USE 1 STRIP TO CHECK GLUCOSE THREE TIMES DAILY AS DIRECTED DX e11.65   insulin glargine (LANTUS SOLOSTAR) 100 UNIT/ML Solostar Pen Inject 50 Units into the skin daily with breakfast.   Insulin Pen Needle 31G X 8 MM MISC 1 each by Does not apply route 2 (two) times daily.   Lancets (FREESTYLE) lancets Use as instructed bid   liraglutide (VICTOZA) 18 MG/3ML SOPN Inject 1.8 mg  into the skin every morning. INJECT 1.8 MG INTO THE SKIN DAILY AS DIRECTED   montelukast (SINGULAIR) 10 MG tablet Take 1 tablet (10 mg total) by mouth at bedtime.   ONETOUCH DELICA LANCETS 50K MISC Three times daily testing dx e11.65   rosuvastatin (CRESTOR) 10 MG tablet Take 1 tablet (10 mg total) by mouth daily.   triamterene-hydrochlorothiazide (MAXZIDE-25) 37.5-25 MG tablet Take 1 tablet by mouth daily.   UNABLE TO FIND Med Name: Power scooter  DX: M17.12   vitamin C (ASCORBIC ACID) 500 MG tablet Take 500 mg by mouth daily.   [DISCONTINUED] FLUZONE QUADRIVALENT 0.5 ML injection    [DISCONTINUED] glipiZIDE (GLUCOTROL XL) 5 MG 24 hr tablet Take 1 tablet (5 mg total) by mouth daily with breakfast.   [DISCONTINUED] insulin glargine (LANTUS) 100 UNIT/ML injection Inject 0.5 mLs (50 Units total) into the skin at bedtime.   [DISCONTINUED] liraglutide (VICTOZA) 18 MG/3ML SOPN Inject 1.8 mg into the skin daily.   No facility-administered encounter medications on file as of 05/30/2022.   ALLERGIES: Allergies  Allergen Reactions   Hydrocodone    Oxycodone    VACCINATION STATUS: Immunization History  Administered Date(s) Administered   Influenza Split 07/17/2014   Influenza,inj,Quad PF,6+ Mos 08/16/2015, 07/26/2016, 08/02/2017, 07/19/2018, 06/16/2019, 07/02/2020   Influenza-Unspecified 08/02/2021   Moderna Sars-Covid-2 Vaccination 12/27/2019, 01/28/2020   PFIZER(Purple Top)SARS-COV-2 Vaccination 09/24/2020   PNEUMOCOCCAL CONJUGATE-20 10/26/2021   Pneumococcal Polysaccharide-23 09/15/2019    Diabetes He presents for his follow-up diabetic visit. He has type 2 diabetes mellitus. Onset time: He was diagnosed at approximate age of 48 years. His disease course has been improving. There are no hypoglycemic associated symptoms. Pertinent negatives for hypoglycemia include no confusion, headaches, pallor or seizures. Pertinent negatives for diabetes include no chest pain, no fatigue, no polydipsia,  no polyphagia, no polyuria and no weakness. There are no hypoglycemic complications. Symptoms are stable. Diabetic complications include nephropathy. Risk factors for coronary artery disease include diabetes mellitus, dyslipidemia, hypertension, family history, male sex, sedentary lifestyle and obesity. Current diabetic treatment includes insulin injections and oral agent (dual therapy) (and Victoza). He is compliant with treatment most of the time. His weight is fluctuating minimally. He is following a generally healthy diet. When asked about meal planning, he reported none. He has not had a previous visit with a dietitian. He rarely participates in exercise. His home blood glucose trend is decreasing steadily. His breakfast blood glucose range is generally 90-110 mg/dl. His lunch blood glucose range is generally 140-180 mg/dl. His dinner blood glucose range is generally 140-180 mg/dl. His bedtime blood glucose range is generally 140-180 mg/dl. His overall blood glucose range is 140-180 mg/dl. (He presents today with his CGM showing mostly at target glycemic profile  overall.  His POCT A1c today is 7.5%, improving from last visit of 8.3%.  He notes his fasting glucose is a bit tight at time and he compensates with juice.  Analysis of his CGM shows TIR 81%, TAR 19%, TBR 0% with a GMI of 6.9%.) An ACE inhibitor/angiotensin II receptor blocker is being taken. He does not see a podiatrist.Eye exam is current.  Hypertension This is a chronic problem. The current episode started more than 1 year ago. The problem has been gradually improving since onset. The problem is controlled. Pertinent negatives include no chest pain, headaches, neck pain, palpitations or shortness of breath. There are no associated agents to hypertension. Risk factors for coronary artery disease include dyslipidemia, diabetes mellitus, male gender, obesity and sedentary lifestyle. Past treatments include ACE inhibitors, diuretics and calcium  channel blockers. The current treatment provides moderate improvement. There are no compliance problems.  Hypertensive end-organ damage includes kidney disease. Identifiable causes of hypertension include chronic renal disease.  Hyperlipidemia This is a chronic problem. The current episode started more than 1 year ago. The problem is controlled. Recent lipid tests were reviewed and are normal. Exacerbating diseases include chronic renal disease, diabetes and obesity. Factors aggravating his hyperlipidemia include fatty foods. Pertinent negatives include no chest pain, myalgias or shortness of breath. Current antihyperlipidemic treatment includes statins. The current treatment provides moderate improvement of lipids. Compliance problems include adherence to diet and adherence to exercise.  Risk factors for coronary artery disease include dyslipidemia, diabetes mellitus, hypertension, male sex, a sedentary lifestyle and obesity.      Review of systems  Constitutional: + Minimally fluctuating body weight,  current Body mass index is 32.05 kg/m. , no fatigue, no subjective hyperthermia, no subjective hypothermia Eyes: no blurry vision, no xerophthalmia ENT: no sore throat, no nodules palpated in throat, no dysphagia/odynophagia, no hoarseness Cardiovascular: no chest pain, no shortness of breath, no palpitations, no leg swelling Respiratory: no cough, no shortness of breath Gastrointestinal: no nausea/vomiting/diarrhea Genitourinary: + polyuria Musculoskeletal: L knee pain with associated swelling- has had cortisone shot with some relief Skin: no rashes, no hyperemia Neurological: no tremors, no numbness, no tingling, no dizziness Psychiatric: no depression, no anxiety    Objective:    BP 114/71   Pulse 64   Ht '5\' 9"'$  (1.753 m)   Wt 217 lb (98.4 kg)   BMI 32.05 kg/m   Wt Readings from Last 3 Encounters:  05/30/22 217 lb (98.4 kg)  04/25/22 215 lb 1.9 oz (97.6 kg)  01/31/22 215 lb 1.9 oz  (97.6 kg)    BP Readings from Last 3 Encounters:  05/30/22 114/71  04/25/22 108/66  01/31/22 117/74     Physical Exam- Limited  Constitutional:  Body mass index is 32.05 kg/m. , not in acute distress, normal state of mind Eyes:  EOMI, no exophthalmos Neck: Supple Cardiovascular: RRR, no murmurs, rubs, or gallops, no edema Respiratory: Adequate breathing efforts, no crackles, rales, rhonchi, or wheezing Musculoskeletal: no gross deformities, strength intact in all four extremities, no gross restriction of joint movements Skin:  no rashes, no hyperemia Neurological: no tremor with outstretched hands     Results for orders placed or performed in visit on 05/30/22  HgB A1c  Result Value Ref Range   Hemoglobin A1C     HbA1c POC (<> result, manual entry) 7.5 4.0 - 5.6 %   HbA1c, POC (prediabetic range)     HbA1c, POC (controlled diabetic range)     Diabetic Labs (most recent): Lab  Results  Component Value Date   HGBA1C 7.5 05/30/2022   HGBA1C 8.3 11/24/2021   HGBA1C 7.6 (A) 09/26/2021   MICROALBUR 1.0 10/31/2019   MICROALBUR 1.3 12/06/2018   MICROALBUR 1.5 10/24/2016   Lipid Panel     Component Value Date/Time   CHOL 98 (L) 01/19/2022 0814   TRIG 70 01/19/2022 0814   HDL 29 (L) 01/19/2022 0814   CHOLHDL 3.4 01/19/2022 0814   CHOLHDL 3.2 07/13/2020 0840   VLDL 12 01/16/2017 0940   LDLCALC 54 01/19/2022 0814   LDLCALC 36 07/13/2020 0840      Assessment & Plan:   1) Diabetes mellitus type 2 without complications, with long term insulin use ([place])  He presents today with his CGM showing mostly at target glycemic profile overall.  His POCT A1c today is 7.5%, improving from last visit of 8.3%.  He notes his fasting glucose is a bit tight at time and he compensates with juice.  Analysis of his CGM shows TIR 81%, TAR 19%, TBR 0% with a GMI of 6.9%.    Recent labs reviewed, showing mild renal insufficiency (stable and somewhat improved).  - Patient remains at a high  risk for more acute and chronic complications of diabetes which include CAD, CVA, CKD, retinopathy, and neuropathy. These are all discussed in detail with the patient.  - Nutritional counseling repeated at each appointment due to patients tendency to fall back in to old habits.  - The patient admits there is a room for improvement in their diet and drink choices. -  Suggestion is made for the patient to avoid simple carbohydrates from their diet including Cakes, Sweet Desserts / Pastries, Ice Cream, Soda (diet and regular), Sweet Tea, Candies, Chips, Cookies, Sweet Pastries, Store Bought Juices, Alcohol in Excess of 1-2 drinks a day, Artificial Sweeteners, Coffee Creamer, and "Sugar-free" Products. This will help patient to have stable blood glucose profile and potentially avoid unintended weight gain.   - I encouraged the patient to switch to unprocessed or minimally processed complex starch and increased protein intake (animal or plant source), fruits, and vegetables.   - Patient is advised to stick to a routine mealtimes to eat 3 meals a day and avoid unnecessary snacks (to snack only to correct hypoglycemia).  - I have approached patient with the following individualized plan to manage diabetes and patient agrees.  Given his stable, at goal glycemic profile, he is advised to continue his Lantus 50 units SQ nightly, Victoza 1.8 mg SQ daily, and Farxiga 5 mg po daily (prescribed by his nephrologist).  I will stop his Glipizide today to help prevent hypoglycemia and the following highs that result from over compensation.  -He is advised to continue monitoring blood glucose at least twice daily using his CGM, before breakfast and before bed, and call the clinic if he gets readings less than 70 or greater than 200 for 3 tests in a row.  - Patient specific target  for A1c; LDL, HDL, Triglycerides, and  Waist Circumference were discussed in detail.  2) BP/HTN: His blood pressure is controlled to  target.  He is advised to continue Amlodipine 10 mg po daily, Benazepril 20 mg po daily, and Triamterene-HCT 37.5-25 mg po daily.  3) Lipids/HPL: Most recent lipid panel from 01/19/22 shows controlled LDL at 54.  He is advised to continue Crestor 10 mg po daily at bedtime.  Side effects and precautions discussed with him.  He is advised to avoid fried foods and butter.  4)  Weight/Diet:  His Body mass index is 32.05 kg/m.-is a candidate for modest weight loss.  CDE consult in progress, exercise, and carbohydrates information provided.  5) Chronic Care/Health Maintenance: -Patient on ACEI and Statin medications and encouraged to continue to follow up with Ophthalmology, Podiatrist at least yearly or according to recommendations, and advised to stay away from smoking. I have recommended yearly flu vaccine and pneumonia vaccination at least every 5 years; moderate intensity exercise for up to 150 minutes weekly; and  sleep for at least 7 hours a day.  I advised patient to maintain close follow up with his PCP for primary care needs.      I spent 30 minutes in the care of the patient today including review of labs from [facility], Lipids, Thyroid Function, Hematology (current and previous including abstractions from other facilities); face-to-face time discussing  his blood glucose readings/logs, discussing hypoglycemia and hyperglycemia episodes and symptoms, medications doses, his options of short and long term treatment based on the latest standards of care / guidelines;  discussion about incorporating lifestyle medicine;  and documenting the encounter. Risk reduction counseling performed per USPSTF guidelines to reduce obesity and cardiovascular risk factors.     Please refer to Patient Instructions for Blood Glucose Monitoring and Insulin/Medications Dosing Guide"  in media tab for additional information. Please  also refer to " Patient Self Inventory" in the Media  tab for reviewed elements of  pertinent patient history.  Travis Herrera participated in the discussions, expressed understanding, and voiced agreement with the above plans.  All questions were answered to his satisfaction. he is encouraged to contact clinic should he have any questions or concerns prior to his return visit.    Follow up plan: Return in about 4 months (around 09/29/2022) for Diabetes F/U with A1c in office, No previsit labs, Bring meter and logs.   Rayetta Pigg, [facility] [address] [place], [place] [address] Phone: [phone] Fax: [phone]  05/30/2022, 8:54 AM

## 2022-06-01 DIAGNOSIS — Z20822 Contact with and (suspected) exposure to covid-19: Secondary | ICD-10-CM | POA: Diagnosis not present

## 2022-06-02 DIAGNOSIS — Z20822 Contact with and (suspected) exposure to covid-19: Secondary | ICD-10-CM | POA: Diagnosis not present

## 2022-06-05 DIAGNOSIS — N189 Chronic kidney disease, unspecified: Secondary | ICD-10-CM | POA: Diagnosis not present

## 2022-06-05 DIAGNOSIS — I129 Hypertensive chronic kidney disease with stage 1 through stage 4 chronic kidney disease, or unspecified chronic kidney disease: Secondary | ICD-10-CM | POA: Diagnosis not present

## 2022-06-05 DIAGNOSIS — Z20822 Contact with and (suspected) exposure to covid-19: Secondary | ICD-10-CM | POA: Diagnosis not present

## 2022-06-05 DIAGNOSIS — E1129 Type 2 diabetes mellitus with other diabetic kidney complication: Secondary | ICD-10-CM | POA: Diagnosis not present

## 2022-06-05 DIAGNOSIS — E1122 Type 2 diabetes mellitus with diabetic chronic kidney disease: Secondary | ICD-10-CM | POA: Diagnosis not present

## 2022-06-05 DIAGNOSIS — D472 Monoclonal gammopathy: Secondary | ICD-10-CM | POA: Diagnosis not present

## 2022-06-05 DIAGNOSIS — R809 Proteinuria, unspecified: Secondary | ICD-10-CM | POA: Diagnosis not present

## 2022-06-06 DIAGNOSIS — Z20822 Contact with and (suspected) exposure to covid-19: Secondary | ICD-10-CM | POA: Diagnosis not present

## 2022-06-07 DIAGNOSIS — D472 Monoclonal gammopathy: Secondary | ICD-10-CM | POA: Diagnosis not present

## 2022-06-07 DIAGNOSIS — Z6830 Body mass index (BMI) 30.0-30.9, adult: Secondary | ICD-10-CM | POA: Diagnosis not present

## 2022-06-07 DIAGNOSIS — E1129 Type 2 diabetes mellitus with other diabetic kidney complication: Secondary | ICD-10-CM | POA: Diagnosis not present

## 2022-06-07 DIAGNOSIS — D638 Anemia in other chronic diseases classified elsewhere: Secondary | ICD-10-CM | POA: Diagnosis not present

## 2022-06-07 DIAGNOSIS — R809 Proteinuria, unspecified: Secondary | ICD-10-CM | POA: Diagnosis not present

## 2022-06-07 DIAGNOSIS — E1122 Type 2 diabetes mellitus with diabetic chronic kidney disease: Secondary | ICD-10-CM | POA: Diagnosis not present

## 2022-06-07 DIAGNOSIS — I129 Hypertensive chronic kidney disease with stage 1 through stage 4 chronic kidney disease, or unspecified chronic kidney disease: Secondary | ICD-10-CM | POA: Diagnosis not present

## 2022-06-07 DIAGNOSIS — N189 Chronic kidney disease, unspecified: Secondary | ICD-10-CM | POA: Diagnosis not present

## 2022-06-09 DIAGNOSIS — Z20822 Contact with and (suspected) exposure to covid-19: Secondary | ICD-10-CM | POA: Diagnosis not present

## 2022-06-10 DIAGNOSIS — Z20822 Contact with and (suspected) exposure to covid-19: Secondary | ICD-10-CM | POA: Diagnosis not present

## 2022-06-27 ENCOUNTER — Other Ambulatory Visit: Payer: Self-pay | Admitting: Family Medicine

## 2022-07-04 ENCOUNTER — Other Ambulatory Visit: Payer: Self-pay | Admitting: Family Medicine

## 2022-07-04 MED ORDER — MONTELUKAST SODIUM 10 MG PO TABS
10.0000 mg | ORAL_TABLET | Freq: Every day | ORAL | 0 refills | Status: DC
Start: 2022-07-04 — End: 2023-01-18

## 2022-07-17 ENCOUNTER — Other Ambulatory Visit: Payer: Self-pay | Admitting: Family Medicine

## 2022-07-18 ENCOUNTER — Other Ambulatory Visit: Payer: Self-pay | Admitting: Family Medicine

## 2022-07-26 ENCOUNTER — Telehealth: Payer: Self-pay | Admitting: Nurse Practitioner

## 2022-07-26 NOTE — Telephone Encounter (Signed)
Pt picked up his Lantus

## 2022-07-26 NOTE — Telephone Encounter (Signed)
Pt aware he has Lantus from Pt assistance ready for p/u

## 2022-07-27 DIAGNOSIS — H43813 Vitreous degeneration, bilateral: Secondary | ICD-10-CM | POA: Diagnosis not present

## 2022-08-08 ENCOUNTER — Telehealth: Payer: Self-pay

## 2022-08-08 NOTE — Progress Notes (Signed)
[facility])                                            [place] Team    08/08/2022  JOHNNEY SCARLATA [birth year] [id number]  [facility])  [place] Team    08/08/2022  THOMSON HERBERS Feb 16, 1956 [id number]  Reason for referral: Medication Assistance Re-enrollment  Referral source:  [place] Technician Current insurance: [employer]   Outreach:  Unsuccessful telephone call with Mr. Vanatta.  HIPAA compliant voicemail left.   Plan: Will outreach patient again in 1 - 3 business days.   Thanks,  Reed Breech, [place] [phone]

## 2022-08-09 NOTE — Progress Notes (Signed)
Travis Herrera)                                            Travis Herrera    08/09/2022  Travis Herrera [birth year] [id number]   Reason for referral: Medication Assistance Re-enrollment  Referral source:  Travis Herrera Current insurance: Travis Herrera   Outreach:  Successful telephone call with Travis Herrera.  HIPAA dentifies verified. Spoke to patient today, verified that he has no income changes, but since he was approved for Medicare Extra-Help (LIS) in 2023, he is no longer eligible for Travis Herrera in 2024. The patient reports that his medications are affordable now with LIS.   Plan: Patient's medication assistance needs met through Medicare Extra-Help, will not submit for Travis Herrera re-enrollment.   Thanks,  Travis Herrera, [place] [phone]

## 2022-08-21 ENCOUNTER — Encounter: Payer: PPO | Admitting: Internal Medicine

## 2022-08-22 ENCOUNTER — Other Ambulatory Visit: Payer: Self-pay | Admitting: Family Medicine

## 2022-08-22 MED ORDER — AMLODIPINE BESYLATE 10 MG PO TABS: 10.0000 mg | ORAL_TABLET | Freq: Every day | ORAL | 0 refills | Status: DC

## 2022-09-05 ENCOUNTER — Encounter: Payer: Self-pay | Admitting: Family Medicine

## 2022-09-06 ENCOUNTER — Other Ambulatory Visit: Payer: Self-pay

## 2022-09-06 MED ORDER — ROSUVASTATIN CALCIUM 10 MG PO TABS: 10.0000 mg | ORAL_TABLET | Freq: Every day | ORAL | 0 refills | Status: DC

## 2022-09-06 MED ORDER — GLIPIZIDE ER 5 MG PO TB24: 5.0000 mg | ORAL_TABLET | Freq: Every morning | ORAL | 2 refills | Status: DC

## 2022-09-06 NOTE — Telephone Encounter (Signed)
Refills sent

## 2022-09-11 DIAGNOSIS — E1122 Type 2 diabetes mellitus with diabetic chronic kidney disease: Secondary | ICD-10-CM | POA: Diagnosis not present

## 2022-09-11 DIAGNOSIS — D472 Monoclonal gammopathy: Secondary | ICD-10-CM | POA: Diagnosis not present

## 2022-09-11 DIAGNOSIS — R809 Proteinuria, unspecified: Secondary | ICD-10-CM | POA: Diagnosis not present

## 2022-09-11 DIAGNOSIS — D638 Anemia in other chronic diseases classified elsewhere: Secondary | ICD-10-CM | POA: Diagnosis not present

## 2022-09-11 DIAGNOSIS — N189 Chronic kidney disease, unspecified: Secondary | ICD-10-CM | POA: Diagnosis not present

## 2022-09-11 DIAGNOSIS — E1129 Type 2 diabetes mellitus with other diabetic kidney complication: Secondary | ICD-10-CM | POA: Diagnosis not present

## 2022-09-11 DIAGNOSIS — I129 Hypertensive chronic kidney disease with stage 1 through stage 4 chronic kidney disease, or unspecified chronic kidney disease: Secondary | ICD-10-CM | POA: Diagnosis not present

## 2022-09-11 DIAGNOSIS — Z6830 Body mass index (BMI) 30.0-30.9, adult: Secondary | ICD-10-CM | POA: Diagnosis not present

## 2022-09-25 ENCOUNTER — Telehealth: Payer: Self-pay | Admitting: Family Medicine

## 2022-09-25 DIAGNOSIS — N189 Chronic kidney disease, unspecified: Secondary | ICD-10-CM | POA: Diagnosis not present

## 2022-09-25 DIAGNOSIS — E1129 Type 2 diabetes mellitus with other diabetic kidney complication: Secondary | ICD-10-CM | POA: Diagnosis not present

## 2022-09-25 DIAGNOSIS — D472 Monoclonal gammopathy: Secondary | ICD-10-CM | POA: Diagnosis not present

## 2022-09-25 DIAGNOSIS — D638 Anemia in other chronic diseases classified elsewhere: Secondary | ICD-10-CM | POA: Diagnosis not present

## 2022-09-25 DIAGNOSIS — R809 Proteinuria, unspecified: Secondary | ICD-10-CM | POA: Diagnosis not present

## 2022-09-25 DIAGNOSIS — E1122 Type 2 diabetes mellitus with diabetic chronic kidney disease: Secondary | ICD-10-CM | POA: Diagnosis not present

## 2022-09-25 DIAGNOSIS — Z6830 Body mass index (BMI) 30.0-30.9, adult: Secondary | ICD-10-CM | POA: Diagnosis not present

## 2022-09-25 DIAGNOSIS — I129 Hypertensive chronic kidney disease with stage 1 through stage 4 chronic kidney disease, or unspecified chronic kidney disease: Secondary | ICD-10-CM | POA: Diagnosis not present

## 2022-09-25 NOTE — Telephone Encounter (Signed)
Placard handicap form  Copied Noted sleeved

## 2022-09-27 NOTE — Telephone Encounter (Signed)
Patient called and  will come by to pick up form

## 2022-09-29 ENCOUNTER — Ambulatory Visit (INDEPENDENT_AMBULATORY_CARE_PROVIDER_SITE_OTHER): Payer: PPO | Admitting: Nurse Practitioner

## 2022-09-29 ENCOUNTER — Encounter: Payer: Self-pay | Admitting: Nurse Practitioner

## 2022-09-29 VITALS — BP 103/63 | HR 64 | Ht 69.0 in | Wt 213.4 lb

## 2022-09-29 DIAGNOSIS — E1122 Type 2 diabetes mellitus with diabetic chronic kidney disease: Secondary | ICD-10-CM | POA: Diagnosis not present

## 2022-09-29 DIAGNOSIS — N1831 Chronic kidney disease, stage 3a: Secondary | ICD-10-CM

## 2022-09-29 DIAGNOSIS — N1832 Chronic kidney disease, stage 3b: Secondary | ICD-10-CM

## 2022-09-29 DIAGNOSIS — Z794 Long term (current) use of insulin: Secondary | ICD-10-CM | POA: Diagnosis not present

## 2022-09-29 DIAGNOSIS — I10 Essential (primary) hypertension: Secondary | ICD-10-CM | POA: Diagnosis not present

## 2022-09-29 DIAGNOSIS — E782 Mixed hyperlipidemia: Secondary | ICD-10-CM | POA: Diagnosis not present

## 2022-09-29 LAB — POCT GLYCOSYLATED HEMOGLOBIN (HGB A1C): Hemoglobin A1C: 7.6 % — AB (ref 4.0–5.6)

## 2022-09-29 MED ORDER — VICTOZA 18 MG/3ML ~~LOC~~ SOPN: 1.8000 mg | PEN_INJECTOR | SUBCUTANEOUS | 3 refills | Status: DC

## 2022-09-29 NOTE — Progress Notes (Signed)
09/29/2022  Endocrinology follow-up note   Subjective:    Patient ID: [name], male    DOB: 21-Sep-1956,    Past Medical History:  Diagnosis Date   Diabetes mellitus without complication ([place]) 5885   insulin started in 2012   Hyperlipemia 2013   Hypertension 2010   Past Surgical History:  Procedure Laterality Date   CATARACT EXTRACTION Right 08/26/2021   CATARACT EXTRACTION W/PHACO  07/29/2012   Procedure: CATARACT EXTRACTION PHACO AND INTRAOCULAR LENS PLACEMENT ([place]);  Surgeon: Tonny Branch, MD;  Location: [facility];  Service: Ophthalmology;  Laterality: Left;  CDE=1.66   CHOLECYSTECTOMY  2007   [facility]   EYE SURGERY Left 2013   cataract   QUADRICEPS TENDON REPAIR Right 03/19/2018   Procedure: REPAIR QUADRICEP TENDON;  Surgeon: Carole Civil, MD;  Location: [facility];  Service: Orthopedics;  Laterality: Right;   Social History   Socioeconomic History   Marital status: Married    Spouse name: Not on file   Number of children: 4   Years of education: Not on file   Highest education level: Not on file  Occupational History   Not on file  Tobacco Use   Smoking status: Never   Smokeless tobacco: Never  Vaping Use   Vaping Use: Never used  Substance and Sexual Activity   Alcohol use: No   Drug use: No   Sexual activity: Yes    Birth control/protection: None  Other Topics Concern   Not on file  Social History Narrative   Not on file   Social Determinants of Health   Financial Resource Strain: Low Risk  (08/16/2021)   Overall Financial Resource Strain (CARDIA)    Difficulty of Paying Living Expenses: Not hard at all  Food Insecurity: No Food Insecurity (08/16/2021)   Hunger Vital Sign    Worried About Running Out of Food in the Last Year: Never true    [place] in the Last Year: Never true  Transportation Needs: No Transportation Needs (08/16/2021)   PRAPARE - Hydrologist (Medical): No    Lack of Transportation  (Non-Medical): No  Physical Activity: Sufficiently Active (08/16/2021)   Exercise Vital Sign    Days of Exercise per Week: 7 days    Minutes of Exercise per Session: 60 min  Stress: No Stress Concern Present (08/16/2021)   [place]    Feeling of Stress : Not at all  Social Connections: [place] (08/16/2021)   Social Connection and Isolation Panel [NHANES]    Frequency of Communication with Friends and Family: More than three times a week    Frequency of Social Gatherings with Friends and Family: More than three times a week    Attends Religious Services: More than 4 times per year    Active Member of [employer] or Organizations: Yes    Attends Archivist Meetings: 1 to 4 times per year    Marital Status: Married   Outpatient Encounter Medications as of 09/29/2022  Medication Sig   amLODipine (NORVASC) 10 MG tablet Take 1 tablet (10 mg total) by mouth daily.   aspirin EC 81 MG tablet Take 81 mg by mouth daily.   benazepril (LOTENSIN) 20 MG tablet Take 1 tablet (20 mg total) by mouth daily.   Continuous Blood Gluc Receiver (FREESTYLE LIBRE READER) DEVI Use to check blood sugar daily dx E11.65   Continuous Blood Gluc Sensor (FREESTYLE  LIBRE 14 DAY SENSOR) MISC USE TO CHECK GLUCOSE DAILY AND CHANGE EVERY 14 DAYS   Continuous Blood Gluc Sensor (FREESTYLE LIBRE SENSOR SYSTEM) MISC Use to check blood sugar daily dx E11.65   FARXIGA 5 MG TABS tablet Take 1 tablet (5 mg total) by mouth every morning.   glucose blood (ONETOUCH VERIO) test strip USE 1 STRIP TO CHECK GLUCOSE THREE TIMES DAILY AS DIRECTED DX e11.65   insulin glargine (LANTUS SOLOSTAR) 100 UNIT/ML Solostar Pen Inject 50 Units into the skin daily with breakfast.   Insulin Pen Needle 31G X 8 MM MISC 1 each by Does not apply route 2 (two) times daily.   Lancets (FREESTYLE) lancets Use as instructed bid   montelukast (SINGULAIR) 10 MG tablet Take 1 tablet  (10 mg total) by mouth at bedtime.   ONETOUCH DELICA LANCETS 88C MISC Three times daily testing dx e11.65   rosuvastatin (CRESTOR) 10 MG tablet Take 1 tablet (10 mg total) by mouth daily.   triamterene-hydrochlorothiazide (MAXZIDE-25) 37.5-25 MG tablet Take 1 tablet by mouth once daily   UNABLE TO FIND Med Name: Power scooter  DX: M17.12   vitamin C (ASCORBIC ACID) 500 MG tablet Take 500 mg by mouth daily.   [DISCONTINUED] glipiZIDE (GLUCOTROL XL) 5 MG 24 hr tablet Take 1 tablet (5 mg total) by mouth every morning.   [DISCONTINUED] liraglutide (VICTOZA) 18 MG/3ML SOPN Inject 1.8 mg into the skin every morning. INJECT 1.8 MG INTO THE SKIN DAILY AS DIRECTED   liraglutide (VICTOZA) 18 MG/3ML SOPN Inject 1.8 mg into the skin every morning. INJECT 1.8 MG INTO THE SKIN DAILY AS DIRECTED   No facility-administered encounter medications on file as of 09/29/2022.   ALLERGIES: Allergies  Allergen Reactions   Hydrocodone    Oxycodone    VACCINATION STATUS: Immunization History  Administered Date(s) Administered   Influenza Split 07/17/2014   Influenza,inj,Quad PF,6+ Mos 08/16/2015, 07/26/2016, 08/02/2017, 07/19/2018, 06/16/2019, 07/02/2020   Influenza-Unspecified 08/02/2021   Moderna Sars-Covid-2 Vaccination 12/27/2019, 01/28/2020   PFIZER(Purple Top)SARS-COV-2 Vaccination 09/24/2020   PNEUMOCOCCAL CONJUGATE-20 10/26/2021   Pneumococcal Polysaccharide-23 09/15/2019    Diabetes He presents for his follow-up diabetic visit. He has type 2 diabetes mellitus. Onset time: He was diagnosed at approximate age of 47 years. His disease course has been improving. There are no hypoglycemic associated symptoms. Pertinent negatives for hypoglycemia include no confusion, headaches, pallor or seizures. Pertinent negatives for diabetes include no chest pain, no fatigue, no polydipsia, no polyphagia, no polyuria and no weakness. There are no hypoglycemic complications. Symptoms are stable. Diabetic complications  include nephropathy. Risk factors for coronary artery disease include diabetes mellitus, dyslipidemia, hypertension, family history, male sex, sedentary lifestyle and obesity. Current diabetic treatment includes insulin injections and oral agent (monotherapy) (and Victoza). He is compliant with treatment most of the time. His weight is fluctuating minimally. He is following a generally healthy diet. When asked about meal planning, he reported none. He has not had a previous visit with a dietitian. He rarely participates in exercise. His home blood glucose trend is decreasing steadily. His overall blood glucose range is 130-140 mg/dl. (He presents today, accompanied by his wife, with his CGM showing at target glycemic profile overall.  His POCT A1c today is 7.6%, essentially unchanged from previous visit.  Analysis of his CGM shows TIR 83%, TAR 16%, TBR 1% with a GMI of 6.7%.  He does have rare fasting hypoglycemia noted, then a subsequent spike in glucose immediately following.  He continues to get steroid injections  in his right knee every so often which may contribute to unchanged A1c as he does have spikes in sugar after the injections.) An ACE inhibitor/angiotensin II receptor blocker is being taken. He does not see a podiatrist.Eye exam is current.  Hypertension This is a chronic problem. The current episode started more than 1 year ago. The problem has been gradually improving since onset. The problem is controlled. Pertinent negatives include no chest pain, headaches, neck pain, palpitations or shortness of breath. There are no associated agents to hypertension. Risk factors for coronary artery disease include dyslipidemia, diabetes mellitus, male gender, obesity and sedentary lifestyle. Past treatments include ACE inhibitors, diuretics and calcium channel blockers. The current treatment provides moderate improvement. There are no compliance problems.  Hypertensive end-organ damage includes kidney disease.  Identifiable causes of hypertension include chronic renal disease.  Hyperlipidemia This is a chronic problem. The current episode started more than 1 year ago. The problem is controlled. Recent lipid tests were reviewed and are normal. Exacerbating diseases include chronic renal disease, diabetes and obesity. Factors aggravating his hyperlipidemia include fatty foods. Pertinent negatives include no chest pain, myalgias or shortness of breath. Current antihyperlipidemic treatment includes statins. The current treatment provides moderate improvement of lipids. Compliance problems include adherence to diet and adherence to exercise.  Risk factors for coronary artery disease include dyslipidemia, diabetes mellitus, hypertension, male sex, a sedentary lifestyle and obesity.    Review of systems  Constitutional: + Minimally fluctuating body weight,  current Body mass index is 31.51 kg/m. , no fatigue, no subjective hyperthermia, no subjective hypothermia Eyes: no blurry vision, no xerophthalmia ENT: no sore throat, no nodules palpated in throat, no dysphagia/odynophagia, no hoarseness Cardiovascular: no chest pain, no shortness of breath, no palpitations, no leg swelling Respiratory: no cough, no shortness of breath Gastrointestinal: no nausea/vomiting/diarrhea Musculoskeletal: no muscle/joint aches Skin: no rashes, no hyperemia Neurological: no tremors, no numbness, no tingling, no dizziness Psychiatric: no depression, no anxiety    Objective:    BP 103/63 (BP Location: Left Arm, Patient Position: Sitting, Cuff Size: Large)   Pulse 64   Ht '5\' 9"'$  (1.753 m)   Wt 213 lb 6.4 oz (96.8 kg)   BMI 31.51 kg/m   Wt Readings from Last 3 Encounters:  09/29/22 213 lb 6.4 oz (96.8 kg)  05/30/22 217 lb (98.4 kg)  04/25/22 215 lb 1.9 oz (97.6 kg)    BP Readings from Last 3 Encounters:  09/29/22 103/63  05/30/22 114/71  04/25/22 108/66     Physical Exam- Limited  Constitutional:  Body mass index  is 31.51 kg/m. , not in acute distress, normal state of mind Eyes:  EOMI, no exophthalmos Musculoskeletal: no gross deformities, strength intact in all four extremities, no gross restriction of joint movements Skin:  no rashes, no hyperemia Neurological: no tremor with outstretched hands    Diabetic Foot Exam - Simple   Simple Foot Form Diabetic Foot exam was performed with the following findings: Yes 09/29/2022  8:15 AM  Visual Inspection No deformities, no ulcerations, no other skin breakdown bilaterally: Yes Sensation Testing Intact to touch and monofilament testing bilaterally: Yes Pulse Check Posterior Tibialis and Dorsalis pulse intact bilaterally: Yes Comments     Results for orders placed or performed in visit on 09/29/22  HgB A1c  Result Value Ref Range   Hemoglobin A1C 7.6 (A) 4.0 - 5.6 %   HbA1c POC (<> result, manual entry)     HbA1c, POC (prediabetic range)     HbA1c,  POC (controlled diabetic range)     Diabetic Labs (most recent): Lab Results  Component Value Date   HGBA1C 7.6 (A) 09/29/2022   HGBA1C 7.5 05/30/2022   HGBA1C 8.3 11/24/2021   MICROALBUR 1.0 10/31/2019   MICROALBUR 1.3 12/06/2018   MICROALBUR 1.5 10/24/2016   Lipid Panel     Component Value Date/Time   CHOL 98 (L) 01/19/2022 0814   TRIG 70 01/19/2022 0814   HDL 29 (L) 01/19/2022 0814   CHOLHDL 3.4 01/19/2022 0814   CHOLHDL 3.2 07/13/2020 0840   VLDL 12 01/16/2017 0940   LDLCALC 54 01/19/2022 0814   LDLCALC 36 07/13/2020 0840      Assessment & Plan:   1) Diabetes mellitus type 2 without complications, with long term insulin use ([place])  He presents today, accompanied by his wife, with his CGM showing at target glycemic profile overall.  His POCT A1c today is 7.6%, essentially unchanged from previous visit.  Analysis of his CGM shows TIR 83%, TAR 16%, TBR 1% with a GMI of 6.7%.  He does have rare fasting hypoglycemia noted, then a subsequent spike in glucose immediately following.  He  continues to get steroid injections in his right knee every so often which may contribute to unchanged A1c as he does have spikes in sugar after the injections.    Recent labs reviewed, showing mild renal insufficiency (stable and somewhat improved).  - Patient remains at a high risk for more acute and chronic complications of diabetes which include CAD, CVA, CKD, retinopathy, and neuropathy. These are all discussed in detail with the patient.  - Nutritional counseling repeated at each appointment due to patients tendency to fall back in to old habits.  - The patient admits there is a room for improvement in their diet and drink choices. -  Suggestion is made for the patient to avoid simple carbohydrates from their diet including Cakes, Sweet Desserts / Pastries, Ice Cream, Soda (diet and regular), Sweet Tea, Candies, Chips, Cookies, Sweet Pastries, Store Bought Juices, Alcohol in Excess of 1-2 drinks a day, Artificial Sweeteners, Coffee Creamer, and "Sugar-free" Products. This will help patient to have stable blood glucose profile and potentially avoid unintended weight gain.   - I encouraged the patient to switch to unprocessed or minimally processed complex starch and increased protein intake (animal or plant source), fruits, and vegetables.   - Patient is advised to stick to a routine mealtimes to eat 3 meals a day and avoid unnecessary snacks (to snack only to correct hypoglycemia).  - I have approached patient with the following individualized plan to manage diabetes and patient agrees.  He is advised to lower his Lantus to 40 units SQ nightly, continue Victoza 1.8 mg SQ daily, and continue his Farxiga 5 mg po daily (prescribed by nephrology).  He is advised to stay off Glipizide for now.   -He is advised to continue monitoring blood glucose at least twice daily using his CGM, before breakfast and before bed, and call the clinic if he gets readings less than 70 or greater than 200 for 3  tests in a row.  - Patient specific target  for A1c; LDL, HDL, Triglycerides, and  Waist Circumference were discussed in detail.  2) BP/HTN: His blood pressure is controlled to target.  He is advised to continue Amlodipine 10 mg po daily, Benazepril 20 mg po daily, and Triamterene-HCT 37.5-25 mg po daily.  3) Lipids/HPL: Most recent lipid panel from 01/19/22 shows controlled LDL at 54.  He  is advised to continue Crestor 10 mg po daily at bedtime.  Side effects and precautions discussed with him.  He is advised to avoid fried foods and butter.     4)  Weight/Diet:  His Body mass index is 31.51 kg/m.-is a candidate for modest weight loss.  CDE consult in progress, exercise, and carbohydrates information provided.  5) Chronic Care/Health Maintenance: -Patient on ACEI and Statin medications and encouraged to continue to follow up with Ophthalmology, Podiatrist at least yearly or according to recommendations, and advised to stay away from smoking. I have recommended yearly flu vaccine and pneumonia vaccination at least every 5 years; moderate intensity exercise for up to 150 minutes weekly; and  sleep for at least 7 hours a day.  I advised patient to maintain close follow up with his PCP for primary care needs.       I spent 44 minutes in the care of the patient today including review of labs from [place], Lipids, Thyroid Function, Hematology (current and previous including abstractions from other facilities); face-to-face time discussing  his blood glucose readings/logs, discussing hypoglycemia and hyperglycemia episodes and symptoms, medications doses, his options of short and long term treatment based on the latest standards of care / guidelines;  discussion about incorporating lifestyle medicine;  and documenting the encounter. Risk reduction counseling performed per USPSTF guidelines to reduce obesity and cardiovascular risk factors.     Please refer to Patient Instructions for Blood Glucose  Monitoring and Insulin/Medications Dosing Guide"  in media tab for additional information. Please  also refer to " Patient Self Inventory" in the Media  tab for reviewed elements of pertinent patient history.  [name] participated in the discussions, expressed understanding, and voiced agreement with the above plans.  All questions were answered to his satisfaction. he is encouraged to contact clinic should he have any questions or concerns prior to his return visit.    Follow up plan: Return in about 3 months (around 12/29/2022) for Diabetes F/U with A1c in office, No previsit labs, Bring meter and logs.   Rayetta Pigg, [facility] [address] [place], [place] [address] Phone: [phone] Fax: [phone]  09/29/2022, 8:24 AM

## 2022-10-03 ENCOUNTER — Telehealth: Payer: Self-pay | Admitting: Nurse Practitioner

## 2022-10-03 NOTE — Telephone Encounter (Signed)
Patient has Lantus in the second fridge. Pt is aware and he will pick it up tomorrow

## 2022-10-11 ENCOUNTER — Other Ambulatory Visit: Payer: Self-pay | Admitting: Family Medicine

## 2022-10-17 ENCOUNTER — Encounter: Payer: Self-pay | Admitting: Family Medicine

## 2022-10-18 ENCOUNTER — Other Ambulatory Visit: Payer: Self-pay

## 2022-10-18 MED ORDER — FREESTYLE LIBRE 14 DAY SENSOR MISC
4 refills | Status: DC
Start: 2022-10-18 — End: 2023-05-23

## 2022-10-29 ENCOUNTER — Other Ambulatory Visit: Payer: Self-pay | Admitting: Family Medicine

## 2022-11-01 ENCOUNTER — Encounter: Payer: PPO | Admitting: Family Medicine

## 2022-11-10 ENCOUNTER — Telehealth: Payer: Self-pay | Admitting: "Endocrinology

## 2022-11-10 NOTE — Telephone Encounter (Signed)
Pt picked up patient assistance Victoza.

## 2022-11-15 ENCOUNTER — Other Ambulatory Visit: Payer: Self-pay | Admitting: Family Medicine

## 2022-11-22 ENCOUNTER — Ambulatory Visit (INDEPENDENT_AMBULATORY_CARE_PROVIDER_SITE_OTHER): Payer: PPO | Admitting: Family Medicine

## 2022-11-22 ENCOUNTER — Encounter: Payer: Self-pay | Admitting: Family Medicine

## 2022-11-22 VITALS — BP 102/65 | HR 80 | Ht 70.0 in | Wt 207.1 lb

## 2022-11-22 DIAGNOSIS — N182 Chronic kidney disease, stage 2 (mild): Secondary | ICD-10-CM

## 2022-11-22 DIAGNOSIS — E559 Vitamin D deficiency, unspecified: Secondary | ICD-10-CM

## 2022-11-22 DIAGNOSIS — Z0001 Encounter for general adult medical examination with abnormal findings: Secondary | ICD-10-CM | POA: Diagnosis not present

## 2022-11-22 DIAGNOSIS — Z23 Encounter for immunization: Secondary | ICD-10-CM | POA: Diagnosis not present

## 2022-11-22 DIAGNOSIS — Z125 Encounter for screening for malignant neoplasm of prostate: Secondary | ICD-10-CM

## 2022-11-22 DIAGNOSIS — I10 Essential (primary) hypertension: Secondary | ICD-10-CM | POA: Diagnosis not present

## 2022-11-22 DIAGNOSIS — Z794 Long term (current) use of insulin: Secondary | ICD-10-CM

## 2022-11-22 DIAGNOSIS — E1122 Type 2 diabetes mellitus with diabetic chronic kidney disease: Secondary | ICD-10-CM | POA: Diagnosis not present

## 2022-11-22 DIAGNOSIS — E782 Mixed hyperlipidemia: Secondary | ICD-10-CM

## 2022-11-22 MED ORDER — BENZONATATE 100 MG PO CAPS: 100.0000 mg | ORAL_CAPSULE | Freq: Two times a day (BID) | ORAL | 1 refills | Status: DC | PRN

## 2022-11-22 NOTE — Patient Instructions (Signed)
F/U in 6 months, re evaluate blood pressure, call if you need me sooner  Fasting lipid, cmp and EGFr, TSH, pSA, vit  D this week please   Congrats on excellent health habits, you are doing GREAT!  Flu vaccine today  Continue to reconsider/ pray on the TdaP, RSV and Covid vaccines, all are recommended for you  It is important that you exercise regularly at least 30 minutes 5 times a week. If you develop chest pain, have severe difficulty breathing, or feel very tired, stop exercising immediately and seek medical attention  Thanks for choosing [facility], we consider it a privelige to serve you.

## 2022-11-22 NOTE — Assessment & Plan Note (Signed)
Hyperlipidemia:Low fat diet discussed and encouraged.   Lipid Panel  Lab Results  Component Value Date   CHOL 98 (L) 01/19/2022   HDL 29 (L) 01/19/2022   LDLCALC 54 01/19/2022   TRIG 70 01/19/2022   CHOLHDL 3.4 01/19/2022     Updated lab needed at/ before next visit.

## 2022-11-22 NOTE — Progress Notes (Signed)
Travis Herrera     MRN: [id number]      DOB: 16-Nov-1955   HPI: Patient is in for annual physical exam. No other health concerns are expressed or addressed at the visit. Recent labs, if available are reviewed. Immunization is reviewed , and  updated if needed.    PE; BP 102/65 (BP Location: Right Arm, Patient Position: Sitting, Cuff Size: Large)   Pulse 80   Ht 5' 10"$  (1.778 m)   Wt 207 lb 1.9 oz (93.9 kg)   SpO2 98%   BMI 29.72 kg/m   Pleasant male, alert and oriented x 3, in no cardio-pulmonary distress. Afebrile. HEENT No facial trauma or asymetry. Sinuses non tender. EOMI External ears normal,  Neck: supple, no adenopathy,JVD or thyromegaly.No bruits.  Chest: Clear to ascultation bilaterally.No crackles or wheezes. Non tender to palpation  Cardiovascular system; Heart sounds normal,  S1 and  S2 ,no S3.  No murmur, or thrill. Apical beat not displaced Peripheral pulses normal.  Abdomen: Soft, non tender  Musculoskeletal exam: Full ROM of spine, hips , shoulders and reduced in left  knee deformity ,swelling and crepitus noted.in left knee No muscle wasting or atrophy.   Neurologic: Cranial nerves 2 to 12 intact. Power, tone ,sensation  normal throughout.  disturbance in gait. No tremor.  Skin: Intact, no ulceration, erythema , scaling or rash noted. Pigmentation normal throughout  Psych; Normal mood and affect. Judgement and concentration normal   Assessment & Plan:  Annual visit for general adult medical examination with abnormal findings Annual exam as documented. . Changes in health habits are decided on by the patient with goals and time frames  set for achieving them. Immunization and cancer screening needs are specifically addressed at this visit.   Mixed hyperlipidemia Hyperlipidemia:Low fat diet discussed and encouraged.   Lipid Panel  Lab Results  Component Value Date   CHOL 98 (L) 01/19/2022   HDL 29 (L) 01/19/2022   LDLCALC 54  01/19/2022   TRIG 70 01/19/2022   CHOLHDL 3.4 01/19/2022     Updated lab needed at/ before next visit.   Benign hypertension Will re assess in 4 months, systolic low with weight loss and exercise, asymptomatic DASH diet and commitment to daily physical activity for a minimum of 30 minutes discussed and encouraged, as a part of hypertension management. The importance of attaining a healthy weight is also discussed.     11/22/2022    8:27 AM 09/29/2022    7:58 AM 05/30/2022    8:27 AM 04/25/2022    8:06 AM 01/31/2022    8:05 AM 01/30/2022   11:15 AM 01/30/2022   11:06 AM  BP/Weight  Systolic BP [id number] [id number]  Diastolic BP 65 63 71 66 74 69 69  Wt. (Lbs) 207.12 213.4 217 215.12 215.12 216.27   BMI 29.72 kg/m2 31.51 kg/m2 32.05 kg/m2 31.77 kg/m2 31.77 kg/m2 32.4 kg/m2        Type 2 diabetes mellitus with stage 2 chronic kidney disease, with long-term current use of insulin [facility]) Mr. Altermatt is reminded of the importance of commitment to daily physical activity for 30 minutes or more, as able and the need to limit carbohydrate intake to 30 to 60 grams per meal to help with blood sugar control.   The need to take medication as prescribed, test blood sugar as directed, and to call between visits if there is a concern that blood sugar is uncontrolled is also discussed.  Mr. Anzures is reminded of the importance of daily foot exam, annual eye examination, and good blood sugar, blood pressure and cholesterol control.     Latest Ref Rng & Units 09/29/2022    8:11 AM 05/30/2022    8:45 AM 01/19/2022    8:14 AM 01/12/2022   10:54 AM 11/24/2021   12:00 AM  Diabetic Labs  HbA1c 4.0 - 5.6 % 7.6  7.5    8.3      Chol 100 - 199 mg/dL   98     HDL >39 mg/dL   29     Calc LDL 0 - 99 mg/dL   54     Triglycerides 0 - 149 mg/dL   70     Creatinine 0.76 - 1.27 mg/dL   1.79  1.96       This result is from an external source.      11/22/2022    8:27 AM 09/29/2022    7:58 AM 05/30/2022     8:27 AM 04/25/2022    8:06 AM 01/31/2022    8:05 AM 01/30/2022   11:15 AM 01/30/2022   11:06 AM  BP/Weight  Systolic BP [id number]  Diastolic BP 65 63 71 66 74 69 69  Wt. (Lbs) 207.12 213.4 217 215.12 215.12 216.27   BMI 29.72 kg/m2 31.51 kg/m2 32.05 kg/m2 31.77 kg/m2 31.77 kg/m2 32.4 kg/m2       Latest Ref Rng & Units 09/29/2022    8:30 AM 05/01/2022   12:00 AM  Foot/eye exam completion dates  Eye Exam No Retinopathy  No Retinopathy      Foot Form Completion  Done      This result is from an external source.      Managed by Endo, not currently at goal per lab data  Vitamin D deficiency Updated lab needed at/ before next visit.

## 2022-11-22 NOTE — Assessment & Plan Note (Signed)
Annual exam as documented. . Changes in health habits are decided on by the patient with goals and time frames  set for achieving them. Immunization and cancer screening needs are specifically addressed at this visit.

## 2022-11-25 ENCOUNTER — Encounter: Payer: Self-pay | Admitting: Family Medicine

## 2022-11-25 DIAGNOSIS — E559 Vitamin D deficiency, unspecified: Secondary | ICD-10-CM | POA: Insufficient documentation

## 2022-11-25 NOTE — Assessment & Plan Note (Signed)
Travis Herrera is reminded of the importance of commitment to daily physical activity for 30 minutes or more, as able and the need to limit carbohydrate intake to 30 to 60 grams per meal to help with blood sugar control.   The need to take medication as prescribed, test blood sugar as directed, and to call between visits if there is a concern that blood sugar is uncontrolled is also discussed.   Travis Herrera is reminded of the importance of daily foot exam, annual eye examination, and good blood sugar, blood pressure and cholesterol control.     Latest Ref Rng & Units 09/29/2022    8:11 AM 05/30/2022    8:45 AM 01/19/2022    8:14 AM 01/12/2022   10:54 AM 11/24/2021   12:00 AM  Diabetic Labs  HbA1c 4.0 - 5.6 % 7.6  7.5    8.3      Chol 100 - 199 mg/dL   98     HDL >39 mg/dL   29     Calc LDL 0 - 99 mg/dL   54     Triglycerides 0 - 149 mg/dL   70     Creatinine 0.76 - 1.27 mg/dL   1.79  1.96       This result is from an external source.      11/22/2022    8:27 AM 09/29/2022    7:58 AM 05/30/2022    8:27 AM 04/25/2022    8:06 AM 01/31/2022    8:05 AM 01/30/2022   11:15 AM 01/30/2022   11:06 AM  BP/Weight  Systolic BP [id number]  Diastolic BP 65 63 71 66 74 69 69  Wt. (Lbs) 207.12 213.4 217 215.12 215.12 216.27   BMI 29.72 kg/m2 31.51 kg/m2 32.05 kg/m2 31.77 kg/m2 31.77 kg/m2 32.4 kg/m2       Latest Ref Rng & Units 09/29/2022    8:30 AM 05/01/2022   12:00 AM  Foot/eye exam completion dates  Eye Exam No Retinopathy  No Retinopathy      Foot Form Completion  Done      This result is from an external source.      Managed by Endo, not currently at goal per lab data

## 2022-11-25 NOTE — Assessment & Plan Note (Signed)
Updated lab needed at/ before next visit.   

## 2022-11-25 NOTE — Assessment & Plan Note (Signed)
Will re assess in 4 months, systolic low with weight loss and exercise, asymptomatic DASH diet and commitment to daily physical activity for a minimum of 30 minutes discussed and encouraged, as a part of hypertension management. The importance of attaining a healthy weight is also discussed.     11/22/2022    8:27 AM 09/29/2022    7:58 AM 05/30/2022    8:27 AM 04/25/2022    8:06 AM 01/31/2022    8:05 AM 01/30/2022   11:15 AM 01/30/2022   11:06 AM  BP/Weight  Systolic BP [id number]  Diastolic BP 65 63 71 66 74 69 69  Wt. (Lbs) 207.12 213.4 217 215.12 215.12 216.27   BMI 29.72 kg/m2 31.51 kg/m2 32.05 kg/m2 31.77 kg/m2 31.77 kg/m2 32.4 kg/m2

## 2022-12-05 ENCOUNTER — Other Ambulatory Visit: Payer: Self-pay | Admitting: Family Medicine

## 2022-12-10 ENCOUNTER — Other Ambulatory Visit: Payer: Self-pay | Admitting: Family Medicine

## 2022-12-14 ENCOUNTER — Encounter: Payer: Self-pay | Admitting: Radiology

## 2022-12-29 ENCOUNTER — Ambulatory Visit: Payer: PPO | Admitting: "Endocrinology

## 2023-01-03 ENCOUNTER — Other Ambulatory Visit: Payer: Self-pay | Admitting: Family Medicine

## 2023-01-05 ENCOUNTER — Ambulatory Visit: Payer: PPO | Admitting: Nurse Practitioner

## 2023-01-09 ENCOUNTER — Other Ambulatory Visit: Payer: Self-pay | Admitting: Family Medicine

## 2023-01-11 ENCOUNTER — Other Ambulatory Visit: Payer: Self-pay | Admitting: Family Medicine

## 2023-01-12 ENCOUNTER — Other Ambulatory Visit: Payer: Self-pay | Admitting: Family Medicine

## 2023-01-17 ENCOUNTER — Ambulatory Visit (INDEPENDENT_AMBULATORY_CARE_PROVIDER_SITE_OTHER): Payer: PPO | Admitting: Nurse Practitioner

## 2023-01-17 ENCOUNTER — Encounter: Payer: Self-pay | Admitting: Nurse Practitioner

## 2023-01-17 VITALS — BP 126/80 | HR 80 | Ht 70.0 in | Wt 211.0 lb

## 2023-01-17 DIAGNOSIS — Z794 Long term (current) use of insulin: Secondary | ICD-10-CM | POA: Diagnosis not present

## 2023-01-17 DIAGNOSIS — N1832 Chronic kidney disease, stage 3b: Secondary | ICD-10-CM | POA: Diagnosis not present

## 2023-01-17 DIAGNOSIS — E1122 Type 2 diabetes mellitus with diabetic chronic kidney disease: Secondary | ICD-10-CM | POA: Diagnosis not present

## 2023-01-17 DIAGNOSIS — E782 Mixed hyperlipidemia: Secondary | ICD-10-CM

## 2023-01-17 DIAGNOSIS — I10 Essential (primary) hypertension: Secondary | ICD-10-CM

## 2023-01-17 LAB — POCT GLYCOSYLATED HEMOGLOBIN (HGB A1C): Hemoglobin A1C: 8.3 % — AB (ref 4.0–5.6)

## 2023-01-17 MED ORDER — LANTUS SOLOSTAR 100 UNIT/ML ~~LOC~~ SOPN: 50.0000 [IU] | PEN_INJECTOR | Freq: Every day | SUBCUTANEOUS | 3 refills | Status: DC

## 2023-01-17 MED ORDER — FARXIGA 5 MG PO TABS: 5.0000 mg | ORAL_TABLET | Freq: Every morning | ORAL | 3 refills | Status: DC

## 2023-01-17 NOTE — Progress Notes (Signed)
01/17/2023  Endocrinology follow-up note   Subjective:    Patient ID: Travis Herrera, male    DOB: October 08, 1956,    Past Medical History:  Diagnosis Date   COVID-19 03/29/2021   Diabetes mellitus without complication [id number]   insulin started in 2012   Hyperlipemia 10/17/2011   Hypertension 10/16/2008   Past Surgical History:  Procedure Laterality Date   CATARACT EXTRACTION Right 08/26/2021   CATARACT EXTRACTION W/PHACO  07/29/2012   Procedure: CATARACT EXTRACTION PHACO AND INTRAOCULAR LENS PLACEMENT ([place]);  Surgeon: Tonny Branch, MD;  Location: [facility];  Service: Ophthalmology;  Laterality: Left;  CDE=1.66   CHOLECYSTECTOMY  2007   [facility]   EYE SURGERY Left 2013   cataract   QUADRICEPS TENDON REPAIR Right 03/19/2018   Procedure: REPAIR QUADRICEP TENDON;  Surgeon: Carole Civil, MD;  Location: [facility];  Service: Orthopedics;  Laterality: Right;   Social History   Socioeconomic History   Marital status: Married    Spouse name: Not on file   Number of children: 4   Years of education: Not on file   Highest education level: Not on file  Occupational History   Not on file  Tobacco Use   Smoking status: Never   Smokeless tobacco: Never  Vaping Use   Vaping Use: Never used  Substance and Sexual Activity   Alcohol use: No   Drug use: No   Sexual activity: Yes    Birth control/protection: None  Other Topics Concern   Not on file  Social History Narrative   Not on file   Social Determinants of Health   Financial Resource Strain: Low Risk  (08/16/2021)   Overall Financial Resource Strain (CARDIA)    Difficulty of Paying Living Expenses: Not hard at all  Food Insecurity: No Food Insecurity (08/16/2021)   Hunger Vital Sign    Worried About Running Out of Food in the Last Year: Never true    [place] in the Last Year: Never true  Transportation Needs: No Transportation Needs (08/16/2021)   PRAPARE - Hydrologist (Medical):  No    Lack of Transportation (Non-Medical): No  Physical Activity: Sufficiently Active (08/16/2021)   Exercise Vital Sign    Days of Exercise per Week: 7 days    Minutes of Exercise per Session: 60 min  Stress: No Stress Concern Present (08/16/2021)   [place]    Feeling of Stress : Not at all  Social Connections: [place] (08/16/2021)   Social Connection and Isolation Panel [NHANES]    Frequency of Communication with Friends and Family: More than three times a week    Frequency of Social Gatherings with Friends and Family: More than three times a week    Attends Religious Services: More than 4 times per year    Active Member of [employer] or Organizations: Yes    Attends Archivist Meetings: 1 to 4 times per year    Marital Status: Married   Outpatient Encounter Medications as of 01/17/2023  Medication Sig   amLODipine (NORVASC) 10 MG tablet Take 1 tablet by mouth once daily   aspirin EC 81 MG tablet Take 81 mg by mouth daily.   benazepril (LOTENSIN) 20 MG tablet Take 1 tablet by mouth once daily   benzonatate (TESSALON) 100 MG capsule Take 1 capsule (100 mg total) by mouth 2 (two) times daily as needed for cough.   Continuous  Blood Gluc Receiver (FREESTYLE LIBRE READER) DEVI Use to check blood sugar daily dx E11.65   Continuous Blood Gluc Sensor (FREESTYLE LIBRE 14 DAY SENSOR) MISC USE TO CHECK GLUCOSE DAILY AND CHANGE EVERY 14 DAYS   Continuous Blood Gluc Sensor (FREESTYLE LIBRE SENSOR SYSTEM) MISC Use to check blood sugar daily dx E11.65   glucose blood (ONETOUCH VERIO) test strip USE 1 STRIP TO CHECK GLUCOSE THREE TIMES DAILY AS DIRECTED DX e11.65   Insulin Pen Needle 31G X 8 MM MISC 1 each by Does not apply route 2 (two) times daily.   Lancets (FREESTYLE) lancets Use as instructed bid   liraglutide (VICTOZA) 18 MG/3ML SOPN Inject 1.8 mg into the skin every morning. INJECT 1.8 MG INTO THE SKIN DAILY AS  DIRECTED   montelukast (SINGULAIR) 10 MG tablet Take 1 tablet (10 mg total) by mouth at bedtime.   ONETOUCH DELICA LANCETS 99991111 MISC Three times daily testing dx e11.65   rosuvastatin (CRESTOR) 10 MG tablet Take 1 tablet by mouth once daily   triamterene-hydrochlorothiazide (MAXZIDE-25) 37.5-25 MG tablet Take 1 tablet by mouth once daily   UNABLE TO FIND Med Name: Power scooter  DX: M17.12   vitamin C (ASCORBIC ACID) 500 MG tablet Take 500 mg by mouth daily.   [DISCONTINUED] FARXIGA 5 MG TABS tablet Take 1 tablet (5 mg total) by mouth every morning.   [DISCONTINUED] insulin glargine (LANTUS SOLOSTAR) 100 UNIT/ML Solostar Pen Inject 50 Units into the skin daily with breakfast.   FARXIGA 5 MG TABS tablet Take 1 tablet (5 mg total) by mouth every morning.   insulin glargine (LANTUS SOLOSTAR) 100 UNIT/ML Solostar Pen Inject 50 Units into the skin at bedtime.   No facility-administered encounter medications on file as of 01/17/2023.   ALLERGIES: Allergies  Allergen Reactions   Hydrocodone    Oxycodone    VACCINATION STATUS: Immunization History  Administered Date(s) Administered   Influenza Split 07/17/2014   Influenza,inj,Quad PF,6+ Mos 08/16/2015, 07/26/2016, 08/02/2017, 07/19/2018, 06/16/2019, 07/02/2020, 11/22/2022   Influenza-Unspecified 08/02/2021   Moderna Sars-Covid-2 Vaccination 12/27/2019, 01/28/2020   PFIZER(Purple Top)SARS-COV-2 Vaccination 09/24/2020   PNEUMOCOCCAL CONJUGATE-20 10/26/2021   Pneumococcal Polysaccharide-23 09/15/2019    Diabetes He presents for his follow-up diabetic visit. He has type 2 diabetes mellitus. Onset time: He was diagnosed at approximate age of 4 years. His disease course has been worsening. There are no hypoglycemic associated symptoms. Pertinent negatives for hypoglycemia include no confusion, headaches, pallor or seizures. Pertinent negatives for diabetes include no chest pain, no fatigue, no polydipsia, no polyphagia, no polyuria and no weakness.  There are no hypoglycemic complications. Symptoms are stable. Diabetic complications include nephropathy. Risk factors for coronary artery disease include diabetes mellitus, dyslipidemia, hypertension, family history, male sex, sedentary lifestyle and obesity. Current diabetic treatment includes insulin injections and oral agent (monotherapy) (and Victoza). He is compliant with treatment most of the time. His weight is increasing steadily. He is following a generally healthy diet. When asked about meal planning, he reported none. He has not had a previous visit with a dietitian. He rarely participates in exercise. His home blood glucose trend is increasing steadily. His overall blood glucose range is 180-200 mg/dl. (He presents today with his CGM showing slightly above target glycemic profile.  His POCT A1c today is 8.3%, increasing from last visit of 7.6%.  He recently lost his mother unexpectedly and has been more stressed, not sleeping well.  Analysis of his CGM shows TIR 49%, TAR 51%, TBR 0% with a GMI of  7.8%.  He denies any hypoglycemia.) An ACE inhibitor/angiotensin II receptor blocker is being taken. He does not see a podiatrist.Eye exam is current.  Hypertension This is a chronic problem. The current episode started more than 1 year ago. The problem has been gradually improving since onset. The problem is controlled. Pertinent negatives include no chest pain, headaches, neck pain, palpitations or shortness of breath. There are no associated agents to hypertension. Risk factors for coronary artery disease include dyslipidemia, diabetes mellitus, male gender, obesity and sedentary lifestyle. Past treatments include ACE inhibitors, diuretics and calcium channel blockers. The current treatment provides moderate improvement. There are no compliance problems.  Hypertensive end-organ damage includes kidney disease. Identifiable causes of hypertension include chronic renal disease.  Hyperlipidemia This is a  chronic problem. The current episode started more than 1 year ago. The problem is controlled. Recent lipid tests were reviewed and are normal. Exacerbating diseases include chronic renal disease, diabetes and obesity. Factors aggravating his hyperlipidemia include fatty foods. Pertinent negatives include no chest pain, myalgias or shortness of breath. Current antihyperlipidemic treatment includes statins. The current treatment provides moderate improvement of lipids. Compliance problems include adherence to diet and adherence to exercise.  Risk factors for coronary artery disease include dyslipidemia, diabetes mellitus, hypertension, male sex, a sedentary lifestyle and obesity.    Review of systems  Constitutional: + increasing body weight,  current Body mass index is 30.28 kg/m. , no fatigue, no subjective hyperthermia, no subjective hypothermia, poor sleep Eyes: no blurry vision, no xerophthalmia ENT: no sore throat, no nodules palpated in throat, no dysphagia/odynophagia, no hoarseness Cardiovascular: no chest pain, no shortness of breath, no palpitations, no leg swelling Respiratory: no cough, no shortness of breath Gastrointestinal: no nausea/vomiting/diarrhea Musculoskeletal: no muscle/joint aches Skin: no rashes, no hyperemia Neurological: no tremors, no numbness, no tingling, no dizziness Psychiatric: no depression, no anxiety, + increased stress relating to recent loss of his mother    Objective:    BP 126/80 (BP Location: Right Arm, Patient Position: Sitting, Cuff Size: Normal)   Pulse 80   Ht 5\' 10"  (1.778 m)   Wt 211 lb (95.7 kg)   BMI 30.28 kg/m   Wt Readings from Last 3 Encounters:  01/17/23 211 lb (95.7 kg)  11/22/22 207 lb 1.9 oz (93.9 kg)  09/29/22 213 lb 6.4 oz (96.8 kg)    BP Readings from Last 3 Encounters:  01/17/23 126/80  11/22/22 102/65  09/29/22 103/63     Physical Exam- Limited  Constitutional:  Body mass index is 30.28 kg/m. , not in acute  distress, normal state of mind Eyes:  EOMI, no exophthalmos Neck: Supple Musculoskeletal: no gross deformities, strength intact in all four extremities, no gross restriction of joint movements Skin:  no rashes, no hyperemia Neurological: no tremor with outstretched hands    Diabetic Foot Exam - Simple   No data filed     Results for orders placed or performed in visit on 01/17/23  HgB A1c  Result Value Ref Range   Hemoglobin A1C 8.3 (A) 4.0 - 5.6 %   HbA1c POC (<> result, manual entry)     HbA1c, POC (prediabetic range)     HbA1c, POC (controlled diabetic range)     Diabetic Labs (most recent): Lab Results  Component Value Date   HGBA1C 8.3 (A) 01/17/2023   HGBA1C 7.6 (A) 09/29/2022   HGBA1C 7.5 05/30/2022   MICROALBUR 1.0 10/31/2019   MICROALBUR 1.3 12/06/2018   MICROALBUR 1.5 10/24/2016   Lipid Panel  Component Value Date/Time   CHOL 98 (L) 01/19/2022 0814   TRIG 70 01/19/2022 0814   HDL 29 (L) 01/19/2022 0814   CHOLHDL 3.4 01/19/2022 0814   CHOLHDL 3.2 07/13/2020 0840   VLDL 12 01/16/2017 0940   LDLCALC 54 01/19/2022 0814   LDLCALC 36 07/13/2020 0840      Assessment & Plan:   1) Diabetes mellitus type 2 without complications, with long term insulin use ([place])  He presents today with his CGM showing slightly above target glycemic profile.  His POCT A1c today is 8.3%, increasing from last visit of 7.6%.  He recently lost his mother unexpectedly and has been more stressed, not sleeping well.  Analysis of his CGM shows TIR 49%, TAR 51%, TBR 0% with a GMI of 7.8%.  He denies any hypoglycemia.    Recent labs reviewed, showing mild renal insufficiency (stable).  - Patient remains at a high risk for more acute and chronic complications of diabetes which include CAD, CVA, CKD, retinopathy, and neuropathy. These are all discussed in detail with the patient.  - Nutritional counseling repeated at each appointment due to patients tendency to fall back in to old  habits.  - The patient admits there is a room for improvement in their diet and drink choices. -  Suggestion is made for the patient to avoid simple carbohydrates from their diet including Cakes, Sweet Desserts / Pastries, Ice Cream, Soda (diet and regular), Sweet Tea, Candies, Chips, Cookies, Sweet Pastries, Store Bought Juices, Alcohol in Excess of 1-2 drinks a day, Artificial Sweeteners, Coffee Creamer, and "Sugar-free" Products. This will help patient to have stable blood glucose profile and potentially avoid unintended weight gain.   - I encouraged the patient to switch to unprocessed or minimally processed complex starch and increased protein intake (animal or plant source), fruits, and vegetables.   - Patient is advised to stick to a routine mealtimes to eat 3 meals a day and avoid unnecessary snacks (to snack only to correct hypoglycemia).  - I have approached patient with the following individualized plan to manage diabetes and patient agrees.  He is advised to continue Lantus 40 units SQ nightly, Victoza 1.8 mg SQ daily, and Farxiga 5 mg po daily (prescribed by nephrology).    -He is advised to continue monitoring blood glucose at least twice daily using his CGM, before breakfast and before bed, and call the clinic if he gets readings less than 70 or greater than 200 for 3 tests in a row.  - Patient specific target  for A1c; LDL, HDL, Triglycerides, and  Waist Circumference were discussed in detail.  2) BP/HTN: His blood pressure is controlled to target.  He is advised to continue Amlodipine 10 mg po daily, Benazepril 20 mg po daily, and Triamterene-HCT 37.5-25 mg po daily.  3) Lipids/HPL: Most recent lipid panel from 01/19/22 shows controlled LDL at 54.  He is advised to continue Crestor 10 mg po daily at bedtime.  Side effects and precautions discussed with him.  He is advised to avoid fried foods and butter.  He has labs ordered by PCP coming up.   4)  Weight/Diet:  His Body mass  index is 30.28 kg/m.-is a candidate for modest weight loss.  CDE consult in progress, exercise, and carbohydrates information provided.  5) Chronic Care/Health Maintenance: -Patient on ACEI and Statin medications and encouraged to continue to follow up with Ophthalmology, Podiatrist at least yearly or according to recommendations, and advised to stay away from smoking. I  have recommended yearly flu vaccine and pneumonia vaccination at least every 5 years; moderate intensity exercise for up to 150 minutes weekly; and  sleep for at least 7 hours a day.  I advised patient to maintain close follow up with his PCP for primary care needs.      I spent  30  minutes in the care of the patient today including review of labs from [place], Lipids, Thyroid Function, Hematology (current and previous including abstractions from other facilities); face-to-face time discussing  his blood glucose readings/logs, discussing hypoglycemia and hyperglycemia episodes and symptoms, medications doses, his options of short and long term treatment based on the latest standards of care / guidelines;  discussion about incorporating lifestyle medicine;  and documenting the encounter. Risk reduction counseling performed per USPSTF guidelines to reduce obesity and cardiovascular risk factors.     Please refer to Patient Instructions for Blood Glucose Monitoring and Insulin/Medications Dosing Guide"  in media tab for additional information. Please  also refer to " Patient Self Inventory" in the Media  tab for reviewed elements of pertinent patient history.  Travis Herrera participated in the discussions, expressed understanding, and voiced agreement with the above plans.  All questions were answered to his satisfaction. he is encouraged to contact clinic should he have any questions or concerns prior to his return visit.    Follow up plan: Return in about 4 months (around 05/19/2023) for Diabetes F/U with A1c in office, No previsit  labs, Bring meter and logs.   Rayetta Pigg, [facility] [address] [place], [place] [address] Phone: [phone] Fax: [phone]  01/17/2023, 8:54 AM

## 2023-01-18 ENCOUNTER — Other Ambulatory Visit: Payer: Self-pay | Admitting: Family Medicine

## 2023-01-19 MED ORDER — MONTELUKAST SODIUM 10 MG PO TABS: 10.0000 mg | ORAL_TABLET | Freq: Every day | ORAL | 0 refills | Status: DC

## 2023-01-27 ENCOUNTER — Other Ambulatory Visit: Payer: Self-pay | Admitting: Family Medicine

## 2023-01-29 DIAGNOSIS — E559 Vitamin D deficiency, unspecified: Secondary | ICD-10-CM | POA: Diagnosis not present

## 2023-01-29 DIAGNOSIS — Z125 Encounter for screening for malignant neoplasm of prostate: Secondary | ICD-10-CM | POA: Diagnosis not present

## 2023-01-29 DIAGNOSIS — I10 Essential (primary) hypertension: Secondary | ICD-10-CM | POA: Diagnosis not present

## 2023-01-29 DIAGNOSIS — E782 Mixed hyperlipidemia: Secondary | ICD-10-CM | POA: Diagnosis not present

## 2023-01-30 LAB — LIPID PANEL
Chol/HDL Ratio: 3 ratio (ref 0.0–5.0)
Cholesterol, Total: 109 mg/dL (ref 100–199)
HDL: 36 mg/dL — ABNORMAL LOW (ref >39)
LDL Chol Calc (NIH): 58 mg/dL (ref 0–99)
Triglycerides: 71 mg/dL (ref 0–149)
VLDL Cholesterol Cal: 15 mg/dL (ref 5–40)

## 2023-01-30 LAB — CMP14+EGFR
ALT: 20 IU/L (ref 0–44)
AST: 25 IU/L (ref 0–40)
Albumin/Globulin Ratio: 1.8 (ref 1.2–2.2)
Albumin: 4.3 g/dL (ref 3.9–4.9)
Alkaline Phosphatase: 98 IU/L (ref 44–121)
BUN/Creatinine Ratio: 15 (ref 10–24)
BUN: 25 mg/dL (ref 8–27)
Bilirubin Total: 0.4 mg/dL (ref 0.0–1.2)
CO2: 21 mmol/L (ref 20–29)
Calcium: 9.4 mg/dL (ref 8.6–10.2)
Chloride: 104 mmol/L (ref 96–106)
Creatinine, Ser: 1.64 mg/dL — ABNORMAL HIGH (ref 0.76–1.27)
Globulin, Total: 2.4 g/dL (ref 1.5–4.5)
Glucose: 137 mg/dL — ABNORMAL HIGH (ref 70–99)
Potassium: 4.4 mmol/L (ref 3.5–5.2)
Sodium: 141 mmol/L (ref 134–144)
Total Protein: 6.7 g/dL (ref 6.0–8.5)
eGFR: 46 mL/min/{1.73_m2} — ABNORMAL LOW (ref >59)

## 2023-01-30 LAB — VITAMIN D 25 HYDROXY (VIT D DEFICIENCY, FRACTURES): Vit D, 25-Hydroxy: 35.8 ng/mL (ref 30.0–100.0)

## 2023-01-30 LAB — TSH: TSH: 1.87 u[IU]/mL (ref 0.450–4.500)

## 2023-01-30 LAB — PSA: Prostate Specific Ag, Serum: 1.7 ng/mL (ref 0.0–4.0)

## 2023-02-05 ENCOUNTER — Encounter: Payer: Self-pay | Admitting: Family Medicine

## 2023-02-06 ENCOUNTER — Other Ambulatory Visit: Payer: Self-pay

## 2023-02-06 MED ORDER — FREESTYLE LIBRE READER DEVI: 5 refills | Status: DC

## 2023-02-06 MED ORDER — FREESTYLE LIBRE SENSOR SYSTEM MISC: 5 refills | Status: DC

## 2023-02-06 NOTE — Telephone Encounter (Signed)
Refill sent.

## 2023-02-10 ENCOUNTER — Other Ambulatory Visit: Payer: Self-pay | Admitting: Family Medicine

## 2023-02-19 DIAGNOSIS — D638 Anemia in other chronic diseases classified elsewhere: Secondary | ICD-10-CM | POA: Diagnosis not present

## 2023-02-19 DIAGNOSIS — Z79899 Other long term (current) drug therapy: Secondary | ICD-10-CM | POA: Diagnosis not present

## 2023-02-19 DIAGNOSIS — R809 Proteinuria, unspecified: Secondary | ICD-10-CM | POA: Diagnosis not present

## 2023-02-20 DIAGNOSIS — D638 Anemia in other chronic diseases classified elsewhere: Secondary | ICD-10-CM | POA: Diagnosis not present

## 2023-02-20 DIAGNOSIS — I129 Hypertensive chronic kidney disease with stage 1 through stage 4 chronic kidney disease, or unspecified chronic kidney disease: Secondary | ICD-10-CM | POA: Diagnosis not present

## 2023-02-20 DIAGNOSIS — E559 Vitamin D deficiency, unspecified: Secondary | ICD-10-CM | POA: Diagnosis not present

## 2023-02-20 DIAGNOSIS — D472 Monoclonal gammopathy: Secondary | ICD-10-CM | POA: Diagnosis not present

## 2023-03-02 ENCOUNTER — Encounter: Payer: Self-pay | Admitting: Family Medicine

## 2023-03-02 ENCOUNTER — Other Ambulatory Visit: Payer: Self-pay | Admitting: Family Medicine

## 2023-03-05 ENCOUNTER — Other Ambulatory Visit: Payer: Self-pay

## 2023-03-05 MED ORDER — TRIAMTERENE-HCTZ 37.5-25 MG PO TABS: 1.0000 | ORAL_TABLET | Freq: Every day | ORAL | 0 refills | Status: DC

## 2023-03-05 MED ORDER — UNABLE TO FIND: 0 refills | Status: AC

## 2023-03-07 ENCOUNTER — Other Ambulatory Visit: Payer: Self-pay | Admitting: Family Medicine

## 2023-03-08 MED ORDER — TRIAMTERENE-HCTZ 37.5-25 MG PO TABS: 1.0000 | ORAL_TABLET | Freq: Every day | ORAL | 0 refills | Status: DC

## 2023-03-22 ENCOUNTER — Other Ambulatory Visit: Payer: Self-pay | Admitting: Family Medicine

## 2023-04-10 ENCOUNTER — Ambulatory Visit (INDEPENDENT_AMBULATORY_CARE_PROVIDER_SITE_OTHER): Payer: PPO

## 2023-04-10 VITALS — BP 120/67 | Ht 70.0 in | Wt 206.0 lb

## 2023-04-10 DIAGNOSIS — Z0000 Encounter for general adult medical examination without abnormal findings: Secondary | ICD-10-CM

## 2023-04-10 NOTE — Progress Notes (Signed)
 Subjective:   Travis Herrera is a 67 y.o. male who presents for Medicare Annual/Subsequent preventive examination.  Visit Complete: Virtual  I connected with  Travis Herrera on 04/10/23 by a audio enabled telemedicine application and verified that I am speaking with the correct person using two identifiers.  Patient Location: Home  Provider Location: Home Office  I discussed the limitations of evaluation and management by telemedicine. The patient expressed understanding and agreed to proceed.  Patient Medicare AWV questionnaire was completed by the patient on n/a; I have confirmed that all information answered by patient is correct and no changes since this date.  Review of Systems     Cardiac Risk Factors include: advanced age (>57men, >71 women);diabetes mellitus;dyslipidemia;hypertension;male gender     Objective:    Today's Vitals   04/10/23 0916 04/10/23 0917  BP: 120/67   Weight: 206 lb (93.4 kg)   Height: 5\' 10"  (1.778 m)   PainSc:  8    Body mass index is 29.56 kg/m.     04/10/2023    9:24 AM 01/30/2022   11:06 AM 01/12/2022    9:32 AM 08/16/2021    2:11 PM 07/26/2020    1:21 PM 06/24/2018    8:35 AM 06/24/2018    8:21 AM  Advanced Directives  Does Patient Have a Medical Advance Directive? No No No No;Yes No No No  Type of Advance Directive    Healthcare Power of Attorney     Does patient want to make changes to medical advance directive?    No - Patient declined     Copy of Healthcare Power of Attorney in Chart?    No - copy requested     Would patient like information on creating a medical advance directive? No - Patient declined No - Patient declined No - Patient declined No - Patient declined No - Patient declined Yes (ED - Information included in AVS) Yes (ED - Information included in AVS)    Current Medications (verified) Outpatient Encounter Medications as of 04/10/2023  Medication Sig   amLODipine (NORVASC) 10 MG tablet Take 1 tablet by mouth once  daily   aspirin EC 81 MG tablet Take 81 mg by mouth daily.   benazepril (LOTENSIN) 20 MG tablet Take 1 tablet by mouth once daily   benzonatate (TESSALON) 100 MG capsule Take 1 capsule (100 mg total) by mouth 2 (two) times daily as needed for cough.   Continuous Blood Gluc Sensor (FREESTYLE LIBRE 14 DAY SENSOR) MISC USE TO CHECK GLUCOSE DAILY AND CHANGE EVERY 14 DAYS   Continuous Glucose Receiver (FREESTYLE LIBRE READER) DEVI Use to check blood sugar daily dx E11.65   Continuous Glucose Sensor (FREESTYLE LIBRE SENSOR SYSTEM) MISC Use to check blood sugar daily dx E11.65   FARXIGA 5 MG TABS tablet Take 1 tablet (5 mg total) by mouth every morning.   glucose blood (ONETOUCH VERIO) test strip USE 1 STRIP TO CHECK GLUCOSE THREE TIMES DAILY AS DIRECTED DX e11.65   insulin glargine (LANTUS SOLOSTAR) 100 UNIT/ML Solostar Pen Inject 50 Units into the skin at bedtime.   Insulin Pen Needle 31G X 8 MM MISC 1 each by Does not apply route 2 (two) times daily.   Lancets (FREESTYLE) lancets Use as instructed bid   liraglutide (VICTOZA) 18 MG/3ML SOPN Inject 1.8 mg into the skin every morning. INJECT 1.8 MG INTO THE SKIN DAILY AS DIRECTED   montelukast (SINGULAIR) 10 MG tablet Take 1 tablet (10 mg total) by mouth  at bedtime.   ONETOUCH DELICA LANCETS 33G MISC Three times daily testing dx e11.65   rosuvastatin (CRESTOR) 10 MG tablet Take 1 tablet by mouth once daily   triamterene-hydrochlorothiazide (MAXZIDE-25) 37.5-25 MG tablet Take 1 tablet by mouth daily.   UNABLE TO FIND Med Name: Power scooter  DX: M17.12   vitamin C (ASCORBIC ACID) 500 MG tablet Take 500 mg by mouth daily.   No facility-administered encounter medications on file as of 04/10/2023.    Allergies (verified) Hydrocodone and Oxycodone   History: Past Medical History:  Diagnosis Date   COVID-19 03/29/2021   Diabetes mellitus without complication (HCC) 10/16/1993   insulin started in 2012   Hyperlipemia 10/17/2011   Hypertension  10/16/2008   Past Surgical History:  Procedure Laterality Date   CATARACT EXTRACTION Right 08/26/2021   CATARACT EXTRACTION W/PHACO  07/29/2012   Procedure: CATARACT EXTRACTION PHACO AND INTRAOCULAR LENS PLACEMENT (IOC);  Surgeon: Gemma Payor, MD;  Location: [facility];  Service: Ophthalmology;  Laterality: Left;  CDE=1.66   CHOLECYSTECTOMY  2007   [name]   EYE SURGERY Left 2013   cataract   QUADRICEPS TENDON REPAIR Right 03/19/2018   Procedure: REPAIR QUADRICEP TENDON;  Surgeon: Vickki Hearing, MD;  Location: [facility];  Service: Orthopedics;  Laterality: Right;   Family History  Problem Relation Age of Onset   Cancer Sister    Early death Father        car accident    Cancer Brother    Hypertension Brother    Diabetes Brother    Hypertension Brother    Social History   Socioeconomic History   Marital status: Married    Spouse name: Not on file   Number of children: 4   Years of education: Not on file   Highest education level: Not on file  Occupational History   Not on file  Tobacco Use   Smoking status: Never   Smokeless tobacco: Never  Vaping Use   Vaping Use: Never used  Substance and Sexual Activity   Alcohol use: No   Drug use: No   Sexual activity: Yes    Birth control/protection: None  Other Topics Concern   Not on file  Social History Narrative   Not on file   Social Determinants of Health   Financial Resource Strain: Low Risk  (04/10/2023)   Overall Financial Resource Strain (CARDIA)    Difficulty of Paying Living Expenses: Not hard at all  Food Insecurity: No Food Insecurity (04/10/2023)   Hunger Vital Sign    Worried About Running Out of Food in the Last Year: Never true    Ran Out of Food in the Last Year: Never true  Transportation Needs: No Transportation Needs (04/10/2023)   PRAPARE - Administrator, Civil Service (Medical): No    Lack of Transportation (Non-Medical): No  Physical Activity: Sufficiently Active (04/10/2023)    Exercise Vital Sign    Days of Exercise per Week: 7 days    Minutes of Exercise per Session: 60 min  Stress: No Stress Concern Present (04/10/2023)   [employer] of Occupational Health - Occupational Stress Questionnaire    Feeling of Stress : Not at all  Social Connections: Socially Integrated (04/10/2023)   Social Connection and Isolation Panel [NHANES]    Frequency of Communication with Friends and Family: More than three times a week    Frequency of Social Gatherings with Friends and Family: More than three times a week  Attends Religious Services: More than 4 times per year    Active Member of Clubs or Organizations: Yes    Attends Engineer, structural: More than 4 times per year    Marital Status: Married    Tobacco Counseling Counseling given: Yes   Clinical Intake:  Pre-visit preparation completed: Yes  Pain : 0-10 Pain Score: 8  Pain Location: Knee Pain Orientation: Left Pain Descriptors / Indicators: Constant Pain Onset: More than a month ago Pain Frequency: Constant     BMI - recorded: 29.56 Nutritional Status: BMI 25 -29 Overweight Nutritional Risks: None Diabetes: Yes CBG done?: No Did pt. bring in CBG monitor from home?: No  How often do you need to have someone help you when you read instructions, pamphlets, or other written materials from your doctor or pharmacy?: 1 - Never  Interpreter Needed?: No  Information entered by ::  Lai Hendriks, CMA   Activities of Daily Living    04/10/2023    9:19 AM  In your present state of health, do you have any difficulty performing the following activities:  Hearing? 0  Vision? 0  Difficulty concentrating or making decisions? 0  Walking or climbing stairs? 1  Comment chronic knee pain  Dressing or bathing? 0  Doing errands, shopping? 0  Preparing Food and eating ? N  Using the Toilet? N  In the past six months, have you accidently leaked urine? N  Do you have problems with loss of bowel  control? N  Managing your Medications? N  Managing your Finances? N  Housekeeping or managing your Housekeeping? N    Patient Care Team: Kerri Perches, MD as PCP - General (Family Medicine)  Indicate any recent Medical Services you may have received from other than [facility] providers in the past year (date may be approximate).     Assessment:   This is a routine wellness examination for Jordyn.  Hearing/Vision screen Hearing Screening - Comments:: Patient denies any hearing difficulties.    Dietary issues and exercise activities discussed:     Goals Addressed             This Visit's Progress    Patient Stated   On track    Going to the gym.       Depression Screen    04/10/2023    9:22 AM 11/22/2022    8:28 AM 04/25/2022    8:08 AM 01/31/2022    8:06 AM 10/26/2021    8:07 AM 08/16/2021    2:12 PM 08/16/2021    2:09 PM  PHQ 2/9 Scores  PHQ - 2 Score 0 0 0 0 0 0 0    Fall Risk    04/10/2023    9:25 AM 11/22/2022    8:28 AM 04/25/2022    8:08 AM 01/31/2022    8:06 AM 10/26/2021    8:07 AM  Fall Risk   Falls in the past year? 0 0 0 0 0  Number falls in past yr: 0 0 0 0 0  Injury with Fall? 0 0 0 0 0  Risk for fall due to : Impaired balance/gait;Impaired mobility;Orthopedic patient No Fall Risks No Fall Risks No Fall Risks No Fall Risks  Follow up Falls prevention discussed Falls evaluation completed Falls evaluation completed Falls evaluation completed Falls evaluation completed    MEDICARE RISK AT HOME:  Medicare Risk at Home - 04/10/23 0925     Any stairs in or around the home? No    If  so, are there any without handrails? No    Home free of loose throw rugs in walkways, pet beds, electrical cords, etc? Yes    Adequate lighting in your home to reduce risk of falls? Yes    Life alert? No    Use of a cane, walker or w/c? No    Grab bars in the bathroom? No    Shower chair or bench in shower? No    Elevated toilet seat or a handicapped toilet? No              TIMED UP AND GO:  Was the test performed?  No    Cognitive Function:    08/16/2021    2:13 PM  MMSE - Mini Mental State Exam  Not completed: Unable to complete        04/10/2023    9:25 AM 08/16/2021    2:13 PM 07/26/2020    1:24 PM 06/26/2019    8:15 AM 06/24/2018    8:25 AM  6CIT Screen  What Year? 0 points 0 points 0 points 0 points 0 points  What month? 0 points 0 points 0 points 0 points 0 points  What time? 0 points 0 points 0 points 0 points 0 points  Count back from 20 0 points 0 points 0 points 0 points 0 points  Months in reverse 0 points 2 points 0 points 0 points 0 points  Repeat phrase 0 points 0 points 0 points 0 points 0 points  Total Score 0 points 2 points 0 points 0 points 0 points    Immunizations Immunization History  Administered Date(s) Administered   Influenza Split 07/17/2014   Influenza,inj,Quad PF,6+ Mos 08/16/2015, 07/26/2016, 08/02/2017, 07/19/2018, 06/16/2019, 07/02/2020, 11/22/2022   Influenza-Unspecified 08/02/2021   Moderna Sars-Covid-2 Vaccination 12/27/2019, 01/28/2020   PFIZER(Purple Top)SARS-COV-2 Vaccination 09/24/2020   PNEUMOCOCCAL CONJUGATE-20 10/26/2021   Pneumococcal Polysaccharide-23 09/15/2019    TDAP status: Due, Education has been provided regarding the importance of this vaccine. Advised may receive this vaccine at local pharmacy or Health Dept. Aware to provide a copy of the vaccination record if obtained from local pharmacy or Health Dept. Verbalized acceptance and understanding.  Flu Vaccine status: Up to date  Pneumococcal vaccine status: NOT AGE APPROPRIATE FOR THIS PATIENT   Covid-19 vaccine status: Information provided on how to obtain vaccines.   Qualifies for Shingles Vaccine? Yes   Patient declined shingles Screening Tests Health Maintenance  Topic Date Due   DTaP/Tdap/Td (1 - Tdap) Never done   Zoster Vaccines- Shingrix (1 of 2) Never done   Medicare Annual Wellness (AWV)  08/16/2022   Diabetic  kidney evaluation - Urine ACR  09/12/2023 (Originally 01/17/2022)   COVID-19 Vaccine (4 - 2023-24 season) 10/11/2023 (Originally 06/16/2022)   OPHTHALMOLOGY EXAM  05/02/2023   INFLUENZA VACCINE  05/17/2023   HEMOGLOBIN A1C  07/19/2023   FOOT EXAM  09/30/2023   Diabetic kidney evaluation - eGFR measurement  01/29/2024   Fecal DNA (Cologuard)  05/03/2025   Hepatitis C Screening  Completed   HIV Screening  Completed   HPV VACCINES  Aged Out   Colonoscopy  Discontinued    Health Maintenance  Health Maintenance Due  Topic Date Due   DTaP/Tdap/Td (1 - Tdap) Never done   Zoster Vaccines- Shingrix (1 of 2) Never done   Medicare Annual Wellness (AWV)  08/16/2022    Colorectal cancer screening: Type of screening: Cologuard. Completed 7/19/20263. Repeat every 3 years  Lung Cancer Screening: (Low Dose CT Chest  recommended if Age 67-80 years, 20 pack-year currently smoking OR have quit w/in 15years.) does not qualify.   Lung Cancer Screening Referral: n/a  Additional Screening:  Hepatitis C Screening: does not qualify; Completed 07/08/2015  Vision Screening: Recommended annual ophthalmology exams for early detection of glaucoma and other disorders of the eye. Is the patient up to date with their annual eye exam?  Yes  Who is the provider or what is the name of the office in which the patient attends annual eye exams? Dr. Charise Killian If pt is not established with a provider, would they like to be referred to a provider to establish care? No .   Dental Screening: Recommended annual dental exams for proper oral hygiene  Diabetic Foot Exam: Diabetic Foot Exam: Completed 09/29/2022  Community Resource Referral / Chronic Care Management: CRR required this visit?  No   CCM required this visit?  No     Plan:     I have personally reviewed and noted the following in the patient's chart:   Medical and social history Use of alcohol, tobacco or illicit drugs  Current medications and supplements  including opioid prescriptions. Patient is not currently taking opioid prescriptions. Functional ability and status Nutritional status Physical activity Advanced directives List of other physicians Hospitalizations, surgeries, and ER visits in previous 12 months Vitals Screenings to include cognitive, depression, and falls Referrals and appointments  In addition, I have reviewed and discussed with patient certain preventive protocols, quality metrics, and best practice recommendations. A written personalized care plan for preventive services as well as general preventive health recommendations were provided to patient.   When patient was asked what medications he is currently taking, he stated his wife takes care of the medicine.   Because this visit was a virtual/telehealth visit,  certain criteria was not obtained, such a blood pressure, CBG if patient is a diabetic, and timed up and go.   Jordan Hawks Meliza Kage, CMA   04/10/2023   After Visit Summary: (MyChart) Due to this being a telephonic visit, the after visit summary with patients personalized plan was offered to patient via MyChart   Nurse Notes: patient declines shingrix.

## 2023-04-16 ENCOUNTER — Encounter: Payer: PPO | Admitting: Internal Medicine

## 2023-04-20 ENCOUNTER — Other Ambulatory Visit: Payer: Self-pay | Admitting: Family Medicine

## 2023-04-25 ENCOUNTER — Ambulatory Visit: Payer: PPO | Admitting: Nurse Practitioner

## 2023-04-25 ENCOUNTER — Encounter: Payer: Self-pay | Admitting: Nurse Practitioner

## 2023-04-25 VITALS — BP 110/76 | HR 66 | Ht 70.0 in | Wt 212.8 lb

## 2023-04-25 DIAGNOSIS — E782 Mixed hyperlipidemia: Secondary | ICD-10-CM

## 2023-04-25 DIAGNOSIS — I10 Essential (primary) hypertension: Secondary | ICD-10-CM | POA: Diagnosis not present

## 2023-04-25 DIAGNOSIS — N1832 Chronic kidney disease, stage 3b: Secondary | ICD-10-CM

## 2023-04-25 DIAGNOSIS — E1122 Type 2 diabetes mellitus with diabetic chronic kidney disease: Secondary | ICD-10-CM | POA: Diagnosis not present

## 2023-04-25 DIAGNOSIS — Z794 Long term (current) use of insulin: Secondary | ICD-10-CM

## 2023-04-25 DIAGNOSIS — Z7985 Long-term (current) use of injectable non-insulin antidiabetic drugs: Secondary | ICD-10-CM

## 2023-04-25 DIAGNOSIS — Z7984 Long term (current) use of oral hypoglycemic drugs: Secondary | ICD-10-CM | POA: Diagnosis not present

## 2023-04-25 LAB — POCT GLYCOSYLATED HEMOGLOBIN (HGB A1C): Hemoglobin A1C: 8 % — AB (ref 4.0–5.6)

## 2023-04-25 NOTE — Progress Notes (Signed)
04/25/2023  Endocrinology follow-up note   Subjective:    Patient ID: Travis Herrera, male    DOB: 1955/11/22,    Past Medical History:  Diagnosis Date   COVID-19 03/29/2021   Diabetes mellitus without complication (HCC) 10/16/1993   insulin started in 2012   Hyperlipemia 10/17/2011   Hypertension 10/16/2008   Past Surgical History:  Procedure Laterality Date   CATARACT EXTRACTION Right 08/26/2021   CATARACT EXTRACTION W/PHACO  07/29/2012   Procedure: CATARACT EXTRACTION PHACO AND INTRAOCULAR LENS PLACEMENT (IOC);  Surgeon: Gemma Payor, MD;  Location: [facility];  Service: Ophthalmology;  Laterality: Left;  CDE=1.66   CHOLECYSTECTOMY  2007   [name]   EYE SURGERY Left 2013   cataract   QUADRICEPS TENDON REPAIR Right 03/19/2018   Procedure: REPAIR QUADRICEP TENDON;  Surgeon: Vickki Hearing, MD;  Location: [facility];  Service: Orthopedics;  Laterality: Right;   Social History   Socioeconomic History   Marital status: Married    Spouse name: Not on file   Number of children: 4   Years of education: Not on file   Highest education level: Not on file  Occupational History   Not on file  Tobacco Use   Smoking status: Never   Smokeless tobacco: Never  Vaping Use   Vaping Use: Never used  Substance and Sexual Activity   Alcohol use: No   Drug use: No   Sexual activity: Yes    Birth control/protection: None  Other Topics Concern   Not on file  Social History Narrative   Not on file   Social Determinants of Health   Financial Resource Strain: Low Risk  (04/10/2023)   Overall Financial Resource Strain (CARDIA)    Difficulty of Paying Living Expenses: Not hard at all  Food Insecurity: No Food Insecurity (04/10/2023)   Hunger Vital Sign    Worried About Running Out of Food in the Last Year: Never true    Ran Out of Food in the Last Year: Never true  Transportation Needs: No Transportation Needs (04/10/2023)   PRAPARE - Administrator, Civil Service  (Medical): No    Lack of Transportation (Non-Medical): No  Physical Activity: Sufficiently Active (04/10/2023)   Exercise Vital Sign    Days of Exercise per Week: 7 days    Minutes of Exercise per Session: 60 min  Stress: No Stress Concern Present (04/10/2023)   [employer] of Occupational Health - Occupational Stress Questionnaire    Feeling of Stress : Not at all  Social Connections: Socially Integrated (04/10/2023)   Social Connection and Isolation Panel [NHANES]    Frequency of Communication with Friends and Family: More than three times a week    Frequency of Social Gatherings with Friends and Family: More than three times a week    Attends Religious Services: More than 4 times per year    Active Member of [employer] or Organizations: Yes    Attends Banker Meetings: More than 4 times per year    Marital Status: Married   Outpatient Encounter Medications as of 04/25/2023  Medication Sig   amLODipine (NORVASC) 10 MG tablet Take 1 tablet by mouth once daily   aspirin EC 81 MG tablet Take 81 mg by mouth daily.   benazepril (LOTENSIN) 20 MG tablet Take 1 tablet by mouth once daily   benzonatate (TESSALON) 100 MG capsule Take 1 capsule (100 mg total) by mouth 2 (two) times daily as needed for cough.  Continuous Blood Gluc Sensor (FREESTYLE LIBRE 14 DAY SENSOR) MISC USE TO CHECK GLUCOSE DAILY AND CHANGE EVERY 14 DAYS   Continuous Glucose Receiver (FREESTYLE LIBRE READER) DEVI Use to check blood sugar daily dx E11.65   Continuous Glucose Sensor (FREESTYLE LIBRE SENSOR SYSTEM) MISC Use to check blood sugar daily dx E11.65   FARXIGA 5 MG TABS tablet Take 1 tablet (5 mg total) by mouth every morning.   glucose blood (ONETOUCH VERIO) test strip USE 1 STRIP TO CHECK GLUCOSE THREE TIMES DAILY AS DIRECTED DX e11.65   insulin glargine (LANTUS SOLOSTAR) 100 UNIT/ML Solostar Pen Inject 50 Units into the skin at bedtime.   Insulin Pen Needle 31G X 8 MM MISC 1 each by Does not apply route  2 (two) times daily.   Lancets (FREESTYLE) lancets Use as instructed bid   liraglutide (VICTOZA) 18 MG/3ML SOPN Inject 1.8 mg into the skin every morning. INJECT 1.8 MG INTO THE SKIN DAILY AS DIRECTED   montelukast (SINGULAIR) 10 MG tablet TAKE 1 TABLET BY MOUTH AT BEDTIME   ONETOUCH DELICA LANCETS 33G MISC Three times daily testing dx e11.65   rosuvastatin (CRESTOR) 10 MG tablet Take 1 tablet by mouth once daily   triamterene-hydrochlorothiazide (MAXZIDE-25) 37.5-25 MG tablet Take 1 tablet by mouth daily.   UNABLE TO FIND Med Name: Power scooter  DX: M17.12   vitamin C (ASCORBIC ACID) 500 MG tablet Take 500 mg by mouth daily.   No facility-administered encounter medications on file as of 04/25/2023.   ALLERGIES: Allergies  Allergen Reactions   Hydrocodone    Oxycodone    VACCINATION STATUS: Immunization History  Administered Date(s) Administered   Influenza Split 07/17/2014   Influenza,inj,Quad PF,6+ Mos 08/16/2015, 07/26/2016, 08/02/2017, 07/19/2018, 06/16/2019, 07/02/2020, 11/22/2022   Influenza-Unspecified 08/02/2021   Moderna Sars-Covid-2 Vaccination 12/27/2019, 01/28/2020   PFIZER(Purple Top)SARS-COV-2 Vaccination 09/24/2020   PNEUMOCOCCAL CONJUGATE-20 10/26/2021   Pneumococcal Polysaccharide-23 09/15/2019    Diabetes He presents for his follow-up diabetic visit. He has type 2 diabetes mellitus. Onset time: He was diagnosed at approximate age of 32 years. His disease course has been improving. There are no hypoglycemic associated symptoms. Pertinent negatives for hypoglycemia include no confusion, headaches, pallor or seizures. Pertinent negatives for diabetes include no chest pain, no fatigue, no polydipsia, no polyphagia, no polyuria and no weakness. There are no hypoglycemic complications. Symptoms are stable. Diabetic complications include nephropathy. Risk factors for coronary artery disease include diabetes mellitus, dyslipidemia, hypertension, family history, male sex,  sedentary lifestyle and obesity. Current diabetic treatment includes insulin injections and oral agent (monotherapy) (and Victoza). He is compliant with treatment most of the time. His weight is fluctuating minimally. He is following a generally healthy diet. When asked about meal planning, he reported none. He has not had a previous visit with a dietitian. He rarely participates in exercise. His home blood glucose trend is fluctuating minimally. His overall blood glucose range is 180-200 mg/dl. (He presents today with his CGM showing slightly above target glycemic profile.  His POCT A1c today is 8%, decreasing from last visit of 8.3%.  He notes some significant family stressors.  Analysis of his CGM shows TIR 37%, TAR 63%, TBR 0% with a GMI of 8%.  He denies any hypoglycemia.  He admits he has taken more of his Lantus from time to time to help combat higher readings.  ) An ACE inhibitor/angiotensin II receptor blocker is being taken. He does not see a podiatrist.Eye exam is current.  Hypertension This is a chronic  problem. The current episode started more than 1 year ago. The problem has been gradually improving since onset. The problem is controlled. Pertinent negatives include no chest pain, headaches, neck pain, palpitations or shortness of breath. There are no associated agents to hypertension. Risk factors for coronary artery disease include dyslipidemia, diabetes mellitus, male gender, obesity and sedentary lifestyle. Past treatments include ACE inhibitors, diuretics and calcium channel blockers. The current treatment provides moderate improvement. There are no compliance problems.  Hypertensive end-organ damage includes kidney disease. Identifiable causes of hypertension include chronic renal disease.  Hyperlipidemia This is a chronic problem. The current episode started more than 1 year ago. The problem is controlled. Recent lipid tests were reviewed and are normal. Exacerbating diseases include chronic  renal disease, diabetes and obesity. Factors aggravating his hyperlipidemia include fatty foods. Pertinent negatives include no chest pain, myalgias or shortness of breath. Current antihyperlipidemic treatment includes statins. The current treatment provides moderate improvement of lipids. Compliance problems include adherence to diet and adherence to exercise.  Risk factors for coronary artery disease include dyslipidemia, diabetes mellitus, hypertension, male sex, a sedentary lifestyle and obesity.    Review of systems  Constitutional: + minimally fluctuating body weight,  current Body mass index is 30.53 kg/m. , no fatigue, no subjective hyperthermia, no subjective hypothermia, poor sleep Eyes: no blurry vision, no xerophthalmia ENT: no sore throat, no nodules palpated in throat, no dysphagia/odynophagia, no hoarseness Cardiovascular: no chest pain, no shortness of breath, no palpitations, no leg swelling Respiratory: no cough, no shortness of breath Gastrointestinal: no nausea/vomiting/diarrhea Musculoskeletal: no muscle/joint aches Skin: no rashes, no hyperemia Neurological: no tremors, no numbness, no tingling, no dizziness Psychiatric: no depression, no anxiety, + increased stress relating to family stressors    Objective:    BP 110/76 (BP Location: Right Arm, Patient Position: Sitting, Cuff Size: Large)   Pulse 66   Ht 5\' 10"  (1.778 m)   Wt 212 lb 12.8 oz (96.5 kg)   BMI 30.53 kg/m   Wt Readings from Last 3 Encounters:  04/25/23 212 lb 12.8 oz (96.5 kg)  04/10/23 206 lb (93.4 kg)  01/17/23 211 lb (95.7 kg)    BP Readings from Last 3 Encounters:  04/25/23 110/76  04/10/23 120/67  01/17/23 126/80     Physical Exam- Limited  Constitutional:  Body mass index is 30.53 kg/m. , not in acute distress, normal state of mind Eyes:  EOMI, no exophthalmos Neck: Supple Musculoskeletal: no gross deformities, strength intact in all four extremities, no gross restriction of joint  movements Skin:  no rashes, no hyperemia Neurological: no tremor with outstretched hands    Diabetic Foot Exam - Simple   No data filed     Results for orders placed or performed in visit on 04/25/23  HgB A1c  Result Value Ref Range   Hemoglobin A1C 8.0 (A) 4.0 - 5.6 %   HbA1c POC (<> result, manual entry)     HbA1c, POC (prediabetic range)     HbA1c, POC (controlled diabetic range)     Diabetic Labs (most recent): Lab Results  Component Value Date   HGBA1C 8.0 (A) 04/25/2023   HGBA1C 8.3 (A) 01/17/2023   HGBA1C 7.6 (A) 09/29/2022   MICROALBUR 1.0 10/31/2019   MICROALBUR 1.3 12/06/2018   MICROALBUR 1.5 10/24/2016   Lipid Panel     Component Value Date/Time   CHOL 109 01/29/2023 0811   TRIG 71 01/29/2023 0811   HDL 36 (L) 01/29/2023 0811   CHOLHDL 3.0 01/29/2023  0811   CHOLHDL 3.2 07/13/2020 0840   VLDL 12 01/16/2017 0940   LDLCALC 58 01/29/2023 0811   LDLCALC 36 07/13/2020 0840      Assessment & Plan:   1) Diabetes mellitus type 2 without complications, with long term insulin use (HCC)  He presents today with his CGM showing slightly above target glycemic profile.  His POCT A1c today is 8%, decreasing from last visit of 8.3%.  He notes some significant family stressors.  Analysis of his CGM shows TIR 37%, TAR 63%, TBR 0% with a GMI of 8%.  He denies any hypoglycemia.  He admits he has taken more of his Lantus from time to time to help combat higher readings.     Recent labs reviewed, showing mild renal insufficiency (stable).  - Patient remains at a high risk for more acute and chronic complications of diabetes which include CAD, CVA, CKD, retinopathy, and neuropathy. These are all discussed in detail with the patient.  - Nutritional counseling repeated at each appointment due to patients tendency to fall back in to old habits.  - The patient admits there is a room for improvement in their diet and drink choices. -  Suggestion is made for the patient to avoid  simple carbohydrates from their diet including Cakes, Sweet Desserts / Pastries, Ice Cream, Soda (diet and regular), Sweet Tea, Candies, Chips, Cookies, Sweet Pastries, Store Bought Juices, Alcohol in Excess of 1-2 drinks a day, Artificial Sweeteners, Coffee Creamer, and "Sugar-free" Products. This will help patient to have stable blood glucose profile and potentially avoid unintended weight gain.   - I encouraged the patient to switch to unprocessed or minimally processed complex starch and increased protein intake (animal or plant source), fruits, and vegetables.   - Patient is advised to stick to a routine mealtimes to eat 3 meals a day and avoid unnecessary snacks (to snack only to correct hypoglycemia).  - I have approached patient with the following individualized plan to manage diabetes and patient agrees.  He is advised to continue Lantus 40 units SQ nightly, Victoza 1.8 mg SQ daily, and Farxiga 5 mg po daily (prescribed by nephrology).  I discussed changing his regimen to [place] and Ozempic but will need to fill out patient assistance paperwork first which I gave him today.  This is perfect timing as he is nearly depleted his current supply of Lantus and Victoza.   Ozempic will do a better job in controlling his afternoon spikes in glucose.  He will transition to [place] at 40 units SQ nightly and Ozempic 0.5 mg SQ weekly.  -He is advised to continue monitoring blood glucose at least twice daily using his CGM, before breakfast and before bed, and call the clinic if he gets readings less than 70 or greater than 200 for 3 tests in a row.  - Patient specific target  for A1c; LDL, HDL, Triglycerides, and  Waist Circumference were discussed in detail.  2) BP/HTN: His blood pressure is controlled to target.  He is advised to continue Amlodipine 10 mg po daily, Benazepril 20 mg po daily, and Triamterene-HCT 37.5-25 mg po daily.  3) Lipids/HPL: Most recent lipid panel from 01/19/22 shows controlled  LDL at 54.  He is advised to continue Crestor 10 mg po daily at bedtime.  Side effects and precautions discussed with him.  He is advised to avoid fried foods and butter.  He has labs ordered by PCP coming up.   4)  Weight/Diet:  His Body mass  index is 30.53 kg/m.-is a candidate for modest weight loss.  CDE consult in progress, exercise, and carbohydrates information provided.  5) Chronic Care/Health Maintenance: -Patient on ACEI and Statin medications and encouraged to continue to follow up with Ophthalmology, Podiatrist at least yearly or according to recommendations, and advised to stay away from smoking. I have recommended yearly flu vaccine and pneumonia vaccination at least every 5 years; moderate intensity exercise for up to 150 minutes weekly; and  sleep for at least 7 hours a day.  I advised patient to maintain close follow up with his PCP for primary care needs.     I spent  42  minutes in the care of the patient today including review of labs from CMP, Lipids, Thyroid Function, Hematology (current and previous including abstractions from other facilities); face-to-face time discussing  his blood glucose readings/logs, discussing hypoglycemia and hyperglycemia episodes and symptoms, medications doses, his options of short and long term treatment based on the latest standards of care / guidelines;  discussion about incorporating lifestyle medicine;  and documenting the encounter. Risk reduction counseling performed per USPSTF guidelines to reduce obesity and cardiovascular risk factors.     Please refer to Patient Instructions for Blood Glucose Monitoring and Insulin/Medications Dosing Guide"  in media tab for additional information. Please  also refer to " Patient Self Inventory" in the Media  tab for reviewed elements of pertinent patient history.  Travis Herrera participated in the discussions, expressed understanding, and voiced agreement with the above plans.  All questions were  answered to his satisfaction. he is encouraged to contact clinic should he have any questions or concerns prior to his return visit.    Follow up plan: Return in about 3 months (around 07/26/2023) for Diabetes F/U with A1c in office, No previsit labs, Bring meter and logs.   Ronny Bacon, [facility] [address] [place], [place] [address] Phone: [phone] Fax: [phone]  04/25/2023, 8:50 AM

## 2023-04-27 ENCOUNTER — Other Ambulatory Visit: Payer: Self-pay | Admitting: Family Medicine

## 2023-05-10 ENCOUNTER — Other Ambulatory Visit: Payer: Self-pay | Admitting: Family Medicine

## 2023-05-14 ENCOUNTER — Other Ambulatory Visit: Payer: Self-pay | Admitting: Family Medicine

## 2023-05-15 ENCOUNTER — Telehealth: Payer: Self-pay | Admitting: Nurse Practitioner

## 2023-05-15 NOTE — Telephone Encounter (Signed)
Patient was called and made aware. 

## 2023-05-15 NOTE — Telephone Encounter (Signed)
He can switch over to the [place] once he is out of his Lantus.  He will start once weekly Ozempic once he has finished his current supply of Victoza (remind him that the Ozempic is only once a week injection whereas he is used to taking the Victoza once a day).

## 2023-05-15 NOTE — Telephone Encounter (Signed)
Pt's wife picked up his insulin. He is asking when does he start this new medication

## 2023-05-15 NOTE — Telephone Encounter (Signed)
Pt made aware his pt assistance is here. He will p/u today

## 2023-05-16 NOTE — Telephone Encounter (Signed)
All I had in the office as far as samples went was Toujeo, so yes, that is what happened.  Evaristo Bury and Nathen May are sister meds, I just didn't have any in office of [place] to give him.

## 2023-05-16 NOTE — Telephone Encounter (Signed)
Pt made aware

## 2023-05-16 NOTE — Telephone Encounter (Signed)
Pt said that his pt assistance was [place] but we gave him Toujeo in the office. I advised him that they are interchangeable, is that why he was given that?

## 2023-05-23 ENCOUNTER — Other Ambulatory Visit: Payer: Self-pay | Admitting: Family Medicine

## 2023-05-24 ENCOUNTER — Ambulatory Visit: Payer: PPO | Admitting: Family Medicine

## 2023-06-05 DIAGNOSIS — N183 Chronic kidney disease, stage 3 unspecified: Secondary | ICD-10-CM | POA: Diagnosis not present

## 2023-06-05 DIAGNOSIS — N189 Chronic kidney disease, unspecified: Secondary | ICD-10-CM | POA: Diagnosis not present

## 2023-06-05 DIAGNOSIS — D631 Anemia in chronic kidney disease: Secondary | ICD-10-CM | POA: Diagnosis not present

## 2023-06-05 DIAGNOSIS — R809 Proteinuria, unspecified: Secondary | ICD-10-CM | POA: Diagnosis not present

## 2023-06-08 DIAGNOSIS — R809 Proteinuria, unspecified: Secondary | ICD-10-CM | POA: Diagnosis not present

## 2023-06-08 DIAGNOSIS — N1831 Chronic kidney disease, stage 3a: Secondary | ICD-10-CM | POA: Diagnosis not present

## 2023-06-08 DIAGNOSIS — E1122 Type 2 diabetes mellitus with diabetic chronic kidney disease: Secondary | ICD-10-CM | POA: Diagnosis not present

## 2023-06-08 DIAGNOSIS — E1129 Type 2 diabetes mellitus with other diabetic kidney complication: Secondary | ICD-10-CM | POA: Diagnosis not present

## 2023-06-11 DIAGNOSIS — H35033 Hypertensive retinopathy, bilateral: Secondary | ICD-10-CM | POA: Diagnosis not present

## 2023-06-11 DIAGNOSIS — H524 Presbyopia: Secondary | ICD-10-CM | POA: Diagnosis not present

## 2023-06-11 LAB — HM DIABETES EYE EXAM

## 2023-06-15 ENCOUNTER — Other Ambulatory Visit: Payer: Self-pay | Admitting: Family Medicine

## 2023-06-27 ENCOUNTER — Other Ambulatory Visit: Payer: Self-pay | Admitting: Family Medicine

## 2023-06-27 MED ORDER — FREESTYLE LIBRE 2 SENSOR MISC: 2 refills | Status: DC

## 2023-07-16 ENCOUNTER — Other Ambulatory Visit: Payer: Self-pay | Admitting: Family Medicine

## 2023-07-18 ENCOUNTER — Other Ambulatory Visit: Payer: Self-pay

## 2023-07-18 ENCOUNTER — Encounter: Payer: Self-pay | Admitting: Family Medicine

## 2023-07-18 ENCOUNTER — Other Ambulatory Visit: Payer: Self-pay | Admitting: Family Medicine

## 2023-07-18 MED ORDER — MONTELUKAST SODIUM 10 MG PO TABS: 10.0000 mg | ORAL_TABLET | Freq: Every day | ORAL | 0 refills | Status: DC

## 2023-07-19 ENCOUNTER — Other Ambulatory Visit: Payer: Self-pay

## 2023-07-19 DIAGNOSIS — E782 Mixed hyperlipidemia: Secondary | ICD-10-CM

## 2023-07-19 DIAGNOSIS — Z794 Long term (current) use of insulin: Secondary | ICD-10-CM

## 2023-07-19 DIAGNOSIS — I10 Essential (primary) hypertension: Secondary | ICD-10-CM

## 2023-07-24 ENCOUNTER — Other Ambulatory Visit: Payer: Self-pay | Admitting: Family Medicine

## 2023-07-26 ENCOUNTER — Encounter: Payer: Self-pay | Admitting: Nurse Practitioner

## 2023-07-26 ENCOUNTER — Ambulatory Visit: Payer: PPO | Admitting: Nurse Practitioner

## 2023-07-26 VITALS — BP 112/60 | HR 64 | Ht 70.0 in | Wt 205.4 lb

## 2023-07-26 DIAGNOSIS — I10 Essential (primary) hypertension: Secondary | ICD-10-CM | POA: Diagnosis not present

## 2023-07-26 DIAGNOSIS — Z794 Long term (current) use of insulin: Secondary | ICD-10-CM | POA: Diagnosis not present

## 2023-07-26 DIAGNOSIS — E782 Mixed hyperlipidemia: Secondary | ICD-10-CM | POA: Diagnosis not present

## 2023-07-26 DIAGNOSIS — E1122 Type 2 diabetes mellitus with diabetic chronic kidney disease: Secondary | ICD-10-CM

## 2023-07-26 DIAGNOSIS — Z7984 Long term (current) use of oral hypoglycemic drugs: Secondary | ICD-10-CM

## 2023-07-26 DIAGNOSIS — N1832 Chronic kidney disease, stage 3b: Secondary | ICD-10-CM

## 2023-07-26 DIAGNOSIS — Z7985 Long-term (current) use of injectable non-insulin antidiabetic drugs: Secondary | ICD-10-CM

## 2023-07-26 LAB — POCT GLYCOSYLATED HEMOGLOBIN (HGB A1C): HbA1c, POC (controlled diabetic range): 8.5 % — AB (ref 0.0–7.0)

## 2023-07-26 NOTE — Progress Notes (Signed)
07/26/2023  Endocrinology follow-up note   Subjective:    Patient ID: Travis Herrera, male    DOB: 03-Sep-1956,    Past Medical History:  Diagnosis Date   COVID-19 03/29/2021   Diabetes mellitus without complication (HCC) 10/16/1993   insulin started in 2012   Hyperlipemia 10/17/2011   Hypertension 10/16/2008   Past Surgical History:  Procedure Laterality Date   CATARACT EXTRACTION Right 08/26/2021   CATARACT EXTRACTION W/PHACO  07/29/2012   Procedure: CATARACT EXTRACTION PHACO AND INTRAOCULAR LENS PLACEMENT (IOC);  Surgeon: Gemma Payor, MD;  Location: [facility];  Service: Ophthalmology;  Laterality: Left;  CDE=1.66   CHOLECYSTECTOMY  2007   [name]   EYE SURGERY Left 2013   cataract   QUADRICEPS TENDON REPAIR Right 03/19/2018   Procedure: REPAIR QUADRICEP TENDON;  Surgeon: Vickki Hearing, MD;  Location: [facility];  Service: Orthopedics;  Laterality: Right;   Social History   Socioeconomic History   Marital status: Married    Spouse name: Not on file   Number of children: 4   Years of education: Not on file   Highest education level: Not on file  Occupational History   Not on file  Tobacco Use   Smoking status: Never   Smokeless tobacco: Never  Vaping Use   Vaping status: Never Used  Substance and Sexual Activity   Alcohol use: No   Drug use: No   Sexual activity: Yes    Birth control/protection: None  Other Topics Concern   Not on file  Social History Narrative   Not on file   Social Determinants of Health   Financial Resource Strain: Low Risk  (04/10/2023)   Overall Financial Resource Strain (CARDIA)    Difficulty of Paying Living Expenses: Not hard at all  Food Insecurity: No Food Insecurity (04/10/2023)   Hunger Vital Sign    Worried About Running Out of Food in the Last Year: Never true    Ran Out of Food in the Last Year: Never true  Transportation Needs: No Transportation Needs (04/10/2023)   PRAPARE - Administrator, Civil Service  (Medical): No    Lack of Transportation (Non-Medical): No  Physical Activity: Sufficiently Active (04/10/2023)   Exercise Vital Sign    Days of Exercise per Week: 7 days    Minutes of Exercise per Session: 60 min  Stress: No Stress Concern Present (04/10/2023)   [employer] of Occupational Health - Occupational Stress Questionnaire    Feeling of Stress : Not at all  Social Connections: Socially Integrated (04/10/2023)   Social Connection and Isolation Panel [NHANES]    Frequency of Communication with Friends and Family: More than three times a week    Frequency of Social Gatherings with Friends and Family: More than three times a week    Attends Religious Services: More than 4 times per year    Active Member of [employer] or Organizations: Yes    Attends Banker Meetings: More than 4 times per year    Marital Status: Married   Outpatient Encounter Medications as of 07/26/2023  Medication Sig   insulin degludec (TRESIBA FLEXTOUCH) 100 UNIT/ML FlexTouch Pen Inject 40 Units into the skin at bedtime.   Semaglutide,0.25 or 0.5MG /DOS, (OZEMPIC, 0.25 OR 0.5 MG/DOSE,) 2 MG/1.5ML SOPN Inject 1 mg into the skin once a week.   amLODipine (NORVASC) 10 MG tablet Take 1 tablet by mouth once daily   aspirin EC 81 MG tablet Take 81 mg by mouth  daily.   benzonatate (TESSALON) 100 MG capsule Take 1 capsule (100 mg total) by mouth 2 (two) times daily as needed for cough.   Continuous Glucose Receiver (FREESTYLE LIBRE READER) DEVI Use to check blood sugar daily dx E11.65   Continuous Glucose Sensor (FREESTYLE LIBRE 2 SENSOR) MISC USE AS DIRECTED TO  CHECK  BLOOD  SUGAR  DAILY   FARXIGA 5 MG TABS tablet Take 1 tablet (5 mg total) by mouth every morning. (Patient taking differently: Take 2.5 mg by mouth every morning.)   glucose blood (ONETOUCH VERIO) test strip USE 1 STRIP TO CHECK GLUCOSE THREE TIMES DAILY AS DIRECTED DX e11.65   Insulin Pen Needle 31G X 8 MM MISC 1 each by Does not apply route 2  (two) times daily.   Lancets (FREESTYLE) lancets Use as instructed bid   montelukast (SINGULAIR) 10 MG tablet Take 1 tablet (10 mg total) by mouth at bedtime.   ONETOUCH DELICA LANCETS 33G MISC Three times daily testing dx e11.65   rosuvastatin (CRESTOR) 10 MG tablet Take 1 tablet by mouth once daily   triamterene-hydrochlorothiazide (MAXZIDE-25) 37.5-25 MG tablet Take 1 tablet by mouth daily. (Patient taking differently: Take 0.5 tablets by mouth daily.)   UNABLE TO FIND Med Name: Power scooter  DX: M17.12   vitamin C (ASCORBIC ACID) 500 MG tablet Take 500 mg by mouth daily.   [DISCONTINUED] benazepril (LOTENSIN) 20 MG tablet Take 1 tablet by mouth once daily   [DISCONTINUED] Continuous Glucose Sensor (FREESTYLE LIBRE 14 DAY SENSOR) MISC USE TO CHECK GLUCOSE DAILY AS DIRECTED -  CHANGE  SENSOR  EVERY  14  DAYS   [DISCONTINUED] insulin glargine (LANTUS SOLOSTAR) 100 UNIT/ML Solostar Pen Inject 50 Units into the skin at bedtime.   [DISCONTINUED] liraglutide (VICTOZA) 18 MG/3ML SOPN Inject 1.8 mg into the skin every morning. INJECT 1.8 MG INTO THE SKIN DAILY AS DIRECTED   No facility-administered encounter medications on file as of 07/26/2023.   ALLERGIES: Allergies  Allergen Reactions   Hydrocodone    Oxycodone    VACCINATION STATUS: Immunization History  Administered Date(s) Administered   Influenza Split 07/17/2014   Influenza,inj,Quad PF,6+ Mos 08/16/2015, 07/26/2016, 08/02/2017, 07/19/2018, 06/16/2019, 07/02/2020, 11/22/2022   Influenza-Unspecified 08/02/2021   Moderna Sars-Covid-2 Vaccination 12/27/2019, 01/28/2020   PFIZER(Purple Top)SARS-COV-2 Vaccination 09/24/2020   PNEUMOCOCCAL CONJUGATE-20 10/26/2021   Pneumococcal Polysaccharide-23 09/15/2019    Diabetes He presents for his follow-up diabetic visit. He has type 2 diabetes mellitus. Onset time: He was diagnosed at approximate age of 32 years. His disease course has been improving. There are no hypoglycemic associated  symptoms. Pertinent negatives for hypoglycemia include no confusion, headaches, pallor or seizures. Pertinent negatives for diabetes include no chest pain, no fatigue, no polydipsia, no polyphagia, no polyuria and no weakness. There are no hypoglycemic complications. Symptoms are stable. Diabetic complications include nephropathy. Risk factors for coronary artery disease include diabetes mellitus, dyslipidemia, hypertension, family history, male sex, sedentary lifestyle and obesity. Current diabetic treatment includes insulin injections and oral agent (monotherapy) (and Victoza). He is compliant with treatment most of the time. His weight is fluctuating minimally. He is following a generally healthy diet. When asked about meal planning, he reported none. He has not had a previous visit with a dietitian. He rarely participates in exercise. His home blood glucose trend is decreasing steadily. His breakfast blood glucose range is generally 90-110 mg/dl. His bedtime blood glucose range is generally 180-200 mg/dl. His overall blood glucose range is 180-200 mg/dl. (He presents today with  his CGM showing tight fasting and mostly at goal postprandial glycemic profile.  His POCT A1c today is 8.5%, increasing from last visit of 8%.  He notes some significant family stressors.  Analysis of his CGM shows TIR 39%, TAR 61%, TBR 0% with a GMI of 8%.  He notes he is being woken up by his CGM 2-3 times per week with low readings.  He has tolerated the switch to Ozempic well.) An ACE inhibitor/angiotensin II receptor blocker is being taken. He does not see a podiatrist.Eye exam is current.  Hypertension This is a chronic problem. The current episode started more than 1 year ago. The problem has been gradually improving since onset. The problem is controlled. Pertinent negatives include no chest pain, headaches, neck pain, palpitations or shortness of breath. There are no associated agents to hypertension. Risk factors for coronary  artery disease include dyslipidemia, diabetes mellitus, male gender, obesity and sedentary lifestyle. Past treatments include ACE inhibitors, diuretics and calcium channel blockers. The current treatment provides moderate improvement. There are no compliance problems.  Hypertensive end-organ damage includes kidney disease. Identifiable causes of hypertension include chronic renal disease.  Hyperlipidemia This is a chronic problem. The current episode started more than 1 year ago. The problem is controlled. Recent lipid tests were reviewed and are normal. Exacerbating diseases include chronic renal disease, diabetes and obesity. Factors aggravating his hyperlipidemia include fatty foods. Pertinent negatives include no chest pain, myalgias or shortness of breath. Current antihyperlipidemic treatment includes statins. The current treatment provides moderate improvement of lipids. Compliance problems include adherence to diet and adherence to exercise.  Risk factors for coronary artery disease include dyslipidemia, diabetes mellitus, hypertension, male sex, a sedentary lifestyle and obesity.    Review of systems  Constitutional: + decreasing body weight,  current Body mass index is 29.47 kg/m. , no fatigue, no subjective hyperthermia, no subjective hypothermia Eyes: no blurry vision, no xerophthalmia ENT: no sore throat, no nodules palpated in throat, no dysphagia/odynophagia, no hoarseness Cardiovascular: no chest pain, no shortness of breath, no palpitations, no leg swelling Respiratory: no cough, no shortness of breath Gastrointestinal: no nausea/vomiting/diarrhea Musculoskeletal: no muscle/joint aches Skin: no rashes, no hyperemia Neurological: no tremors, no numbness, no tingling, no dizziness Psychiatric: no depression, no anxiety, + increased stress relating to family stressors    Objective:    BP 112/60   Pulse 64   Ht 5\' 10"  (1.778 m)   Wt 205 lb 6.4 oz (93.2 kg)   BMI 29.47 kg/m    Wt Readings from Last 3 Encounters:  07/26/23 205 lb 6.4 oz (93.2 kg)  04/25/23 212 lb 12.8 oz (96.5 kg)  04/10/23 206 lb (93.4 kg)    BP Readings from Last 3 Encounters:  07/26/23 112/60  04/25/23 110/76  04/10/23 120/67     Physical Exam- Limited  Constitutional:  Body mass index is 29.47 kg/m. , not in acute distress, normal state of mind Eyes:  EOMI, no exophthalmos Musculoskeletal: no gross deformities, strength intact in all four extremities, no gross restriction of joint movements Skin:  no rashes, no hyperemia Neurological: no tremor with outstretched hands    Diabetic Foot Exam - Simple   No data filed     Results for orders placed or performed in visit on 07/26/23  HgB A1c  Result Value Ref Range   Hemoglobin A1C     HbA1c POC (<> result, manual entry)     HbA1c, POC (prediabetic range)     HbA1c, POC (controlled diabetic  range) 8.5 (A) 0.0 - 7.0 %   Diabetic Labs (most recent): Lab Results  Component Value Date   HGBA1C 8.5 (A) 07/26/2023   HGBA1C 8.0 (A) 04/25/2023   HGBA1C 8.3 (A) 01/17/2023   MICROALBUR 1.0 10/31/2019   MICROALBUR 1.3 12/06/2018   MICROALBUR 1.5 10/24/2016   Lipid Panel     Component Value Date/Time   CHOL 109 01/29/2023 0811   TRIG 71 01/29/2023 0811   HDL 36 (L) 01/29/2023 0811   CHOLHDL 3.0 01/29/2023 0811   CHOLHDL 3.2 07/13/2020 0840   VLDL 12 01/16/2017 0940   LDLCALC 58 01/29/2023 0811   LDLCALC 36 07/13/2020 0840      Assessment & Plan:   1) Diabetes mellitus type 2 without complications, with long term insulin use (HCC)  He presents today with his CGM showing tight fasting and mostly at goal postprandial glycemic profile.  His POCT A1c today is 8.5%, increasing from last visit of 8%.  He notes some significant family stressors.  Analysis of his CGM shows TIR 39%, TAR 61%, TBR 0% with a GMI of 8%.  He notes he is being woken up by his CGM 2-3 times per week with low readings.  He has tolerated the switch to  Ozempic well.    Recent labs reviewed, showing mild renal insufficiency (stable).  - Patient remains at a high risk for more acute and chronic complications of diabetes which include CAD, CVA, CKD, retinopathy, and neuropathy. These are all discussed in detail with the patient.  - Nutritional counseling repeated at each appointment due to patients tendency to fall back in to old habits.  - The patient admits there is a room for improvement in their diet and drink choices. -  Suggestion is made for the patient to avoid simple carbohydrates from their diet including Cakes, Sweet Desserts / Pastries, Ice Cream, Soda (diet and regular), Sweet Tea, Candies, Chips, Cookies, Sweet Pastries, Store Bought Juices, Alcohol in Excess of 1-2 drinks a day, Artificial Sweeteners, Coffee Creamer, and "Sugar-free" Products. This will help patient to have stable blood glucose profile and potentially avoid unintended weight gain.   - I encouraged the patient to switch to unprocessed or minimally processed complex starch and increased protein intake (animal or plant source), fruits, and vegetables.   - Patient is advised to stick to a routine mealtimes to eat 3 meals a day and avoid unnecessary snacks (to snack only to correct hypoglycemia).  - I have approached patient with the following individualized plan to manage diabetes and patient agrees.  He is advised to lower his Tresiba to 20 units SQ nightly and increase Ozempic to 1 mg SQ weekly (will send updated form to PAP).  He is advised to continue his Farxiga 2.5 mg po daily (at advice of his nephrologist).  -He is advised to continue monitoring blood glucose at least twice daily using his CGM, before breakfast and before bed, and call the clinic if he gets readings less than 70 or greater than 200 for 3 tests in a row.  - Patient specific target  for A1c; LDL, HDL, Triglycerides, and  Waist Circumference were discussed in detail.  2) BP/HTN: His blood  pressure is controlled to target.  He is advised to continue Amlodipine 10 mg po daily, Benazepril 20 mg po daily, and Triamterene-HCT 37.5-25 mg po daily.  3) Lipids/HPL: Most recent lipid panel from 01/29/23 shows controlled LDL at 58.  He is advised to continue Crestor 10 mg po daily  at bedtime.  Side effects and precautions discussed with him.  He is advised to avoid fried foods and butter.    4)  Weight/Diet:  His Body mass index is 29.47 kg/m.-is a candidate for modest weight loss.  CDE consult in progress, exercise, and carbohydrates information provided.  5) Chronic Care/Health Maintenance: -Patient on ACEI and Statin medications and encouraged to continue to follow up with Ophthalmology, Podiatrist at least yearly or according to recommendations, and advised to stay away from smoking. I have recommended yearly flu vaccine and pneumonia vaccination at least every 5 years; moderate intensity exercise for up to 150 minutes weekly; and  sleep for at least 7 hours a day.  I advised patient to maintain close follow up with his PCP for primary care needs.      I spent  42  minutes in the care of the patient today including review of labs from CMP, Lipids, Thyroid Function, Hematology (current and previous including abstractions from other facilities); face-to-face time discussing  his blood glucose readings/logs, discussing hypoglycemia and hyperglycemia episodes and symptoms, medications doses, his options of short and long term treatment based on the latest standards of care / guidelines;  discussion about incorporating lifestyle medicine;  and documenting the encounter. Risk reduction counseling performed per USPSTF guidelines to reduce obesity and cardiovascular risk factors.     Please refer to Patient Instructions for Blood Glucose Monitoring and Insulin/Medications Dosing Guide"  in media tab for additional information. Please  also refer to " Patient Self Inventory" in the Media  tab for  reviewed elements of pertinent patient history.  Travis Herrera participated in the discussions, expressed understanding, and voiced agreement with the above plans.  All questions were answered to his satisfaction. he is encouraged to contact clinic should he have any questions or concerns prior to his return visit.    Follow up plan: Return in about 4 months (around 11/26/2023) for Diabetes F/U with A1c in office, No previsit labs, Bring meter and logs.   Ronny Bacon, [facility] [address] [place], [place] [address] Phone: [phone] Fax: [phone]  07/26/2023, 8:49 AM

## 2023-07-27 ENCOUNTER — Encounter: Payer: Self-pay | Admitting: Family Medicine

## 2023-07-27 ENCOUNTER — Ambulatory Visit (INDEPENDENT_AMBULATORY_CARE_PROVIDER_SITE_OTHER): Payer: PPO | Admitting: Family Medicine

## 2023-07-27 VITALS — BP 121/71 | HR 64 | Ht 70.0 in | Wt 204.0 lb

## 2023-07-27 DIAGNOSIS — E66811 Obesity, class 1: Secondary | ICD-10-CM | POA: Diagnosis not present

## 2023-07-27 DIAGNOSIS — E1122 Type 2 diabetes mellitus with diabetic chronic kidney disease: Secondary | ICD-10-CM | POA: Diagnosis not present

## 2023-07-27 DIAGNOSIS — E0789 Other specified disorders of thyroid: Secondary | ICD-10-CM

## 2023-07-27 DIAGNOSIS — I10 Essential (primary) hypertension: Secondary | ICD-10-CM | POA: Diagnosis not present

## 2023-07-27 DIAGNOSIS — Z1329 Encounter for screening for other suspected endocrine disorder: Secondary | ICD-10-CM

## 2023-07-27 DIAGNOSIS — J302 Other seasonal allergic rhinitis: Secondary | ICD-10-CM | POA: Diagnosis not present

## 2023-07-27 DIAGNOSIS — E782 Mixed hyperlipidemia: Secondary | ICD-10-CM

## 2023-07-27 DIAGNOSIS — E559 Vitamin D deficiency, unspecified: Secondary | ICD-10-CM

## 2023-07-27 DIAGNOSIS — Z794 Long term (current) use of insulin: Secondary | ICD-10-CM

## 2023-07-27 DIAGNOSIS — Z125 Encounter for screening for malignant neoplasm of prostate: Secondary | ICD-10-CM

## 2023-07-27 DIAGNOSIS — N182 Chronic kidney disease, stage 2 (mild): Secondary | ICD-10-CM | POA: Diagnosis not present

## 2023-07-27 LAB — CBC
Hematocrit: 42.6 % (ref 37.5–51.0)
Hemoglobin: 14 g/dL (ref 13.0–17.7)
MCH: 30.4 pg (ref 26.6–33.0)
MCHC: 32.9 g/dL (ref 31.5–35.7)
MCV: 93 fL (ref 79–97)
Platelets: 301 10*3/uL (ref 150–450)
RBC: 4.6 x10E6/uL (ref 4.14–5.80)
RDW: 13.1 % (ref 11.6–15.4)
WBC: 7.8 10*3/uL (ref 3.4–10.8)

## 2023-07-27 LAB — LIPID PANEL
Chol/HDL Ratio: 3.2 {ratio} (ref 0.0–5.0)
Cholesterol, Total: 114 mg/dL (ref 100–199)
HDL: 36 mg/dL — ABNORMAL LOW (ref >39)
LDL Chol Calc (NIH): 64 mg/dL (ref 0–99)
Triglycerides: 66 mg/dL (ref 0–149)
VLDL Cholesterol Cal: 14 mg/dL (ref 5–40)

## 2023-07-27 LAB — CMP14+EGFR
ALT: 13 [IU]/L (ref 0–44)
AST: 18 [IU]/L (ref 0–40)
Albumin: 4.5 g/dL (ref 3.9–4.9)
Alkaline Phosphatase: 97 [IU]/L (ref 44–121)
BUN/Creatinine Ratio: 15 (ref 10–24)
BUN: 24 mg/dL (ref 8–27)
Bilirubin Total: 0.4 mg/dL (ref 0.0–1.2)
CO2: 22 mmol/L (ref 20–29)
Calcium: 9.8 mg/dL (ref 8.6–10.2)
Chloride: 102 mmol/L (ref 96–106)
Creatinine, Ser: 1.63 mg/dL — ABNORMAL HIGH (ref 0.76–1.27)
Globulin, Total: 2.7 g/dL (ref 1.5–4.5)
Glucose: 89 mg/dL (ref 70–99)
Potassium: 4.1 mmol/L (ref 3.5–5.2)
Sodium: 143 mmol/L (ref 134–144)
Total Protein: 7.2 g/dL (ref 6.0–8.5)
eGFR: 47 mL/min/{1.73_m2} — ABNORMAL LOW (ref >59)

## 2023-07-27 MED ORDER — MONTELUKAST SODIUM 10 MG PO TABS: 10.0000 mg | ORAL_TABLET | Freq: Every day | ORAL | 1 refills | Status: DC

## 2023-07-27 NOTE — Patient Instructions (Addendum)
Annual exam last week in April , call if you needme sooner  Excellent labs and exam , keep up the great work  No changes in meds  Pls get flu, covid and RSV vaccines    Fasting lipid, cmp and eGFR, PSA, tSH, Vit D 4/16 or after  It is important that you exercise regularly at least 30 minutes 5 times a week. If you develop chest pain, have severe difficulty breathing, or feel very tired, stop exercising immediately and seek medical attention   Thanks for choosing [facility], we consider it a privelige to serve you.

## 2023-07-29 NOTE — Assessment & Plan Note (Signed)
Travis Herrera is reminded of the importance of commitment to daily physical activity for 30 minutes or more, as able and the need to limit carbohydrate intake to 30 to 60 grams per meal to help with blood sugar control.   The need to take medication as prescribed, test blood sugar as directed, and to call between visits if there is a concern that blood sugar is uncontrolled is also discussed.   Travis Herrera is reminded of the importance of daily foot exam, annual eye examination, and good blood sugar, blood pressure and cholesterol control. Deteriorated , managed by Endo recent med adjustments, should and needs to improve    Latest Ref Rng & Units 07/26/2023    8:47 AM 07/26/2023    8:17 AM 04/25/2023    8:23 AM 01/29/2023    8:11 AM 01/17/2023    8:42 AM  Diabetic Labs  HbA1c 0.0 - 7.0 %  8.5  8.0   8.3   Chol 100 - 199 mg/dL 161    096    HDL >04 mg/dL 36    36    Calc LDL 0 - 99 mg/dL 64    58    Triglycerides 0 - 149 mg/dL 66    71    Creatinine 0.76 - 1.27 mg/dL 5.40    9.81        19/14/7829    8:48 AM 07/26/2023    8:04 AM 04/25/2023    8:04 AM 04/10/2023    9:16 AM 01/17/2023    8:22 AM 11/22/2022    8:27 AM 09/29/2022    7:58 AM  BP/Weight  Systolic BP 121 112 110 120 126 102 103  Diastolic BP 71 60 76 67 80 65 63  Wt. (Lbs) 204.04 205.4 212.8 206 211 207.12 213.4  BMI 29.28 kg/m2 29.47 kg/m2 30.53 kg/m2 29.56 kg/m2 30.28 kg/m2 29.72 kg/m2 31.51 kg/m2      Latest Ref Rng & Units 06/11/2023   12:00 AM 09/29/2022    8:30 AM  Foot/eye exam completion dates  Eye Exam No Retinopathy No Retinopathy       Foot Form Completion   Done     This result is from an external source.

## 2023-07-29 NOTE — Assessment & Plan Note (Signed)
Controlled, no change in medication DASH diet and commitment to daily physical activity for a minimum of 30 minutes discussed and encouraged, as a part of hypertension management. The importance of attaining a healthy weight is also discussed.     07/27/2023    8:48 AM 07/26/2023    8:04 AM 04/25/2023    8:04 AM 04/10/2023    9:16 AM 01/17/2023    8:22 AM 11/22/2022    8:27 AM 09/29/2022    7:58 AM  BP/Weight  Systolic BP 121 112 110 120 126 102 103  Diastolic BP 71 60 76 67 80 65 63  Wt. (Lbs) 204.04 205.4 212.8 206 211 207.12 213.4  BMI 29.28 kg/m2 29.47 kg/m2 30.53 kg/m2 29.56 kg/m2 30.28 kg/m2 29.72 kg/m2 31.51 kg/m2

## 2023-07-29 NOTE — Assessment & Plan Note (Signed)
Updated lab needed at/ before next visit.   

## 2023-07-29 NOTE — Progress Notes (Signed)
Travis Herrera     MRN: [id number]      DOB: June 30, 1956  Chief Complaint  Patient presents with   Care Management    6 month f/u, pt states had medication changes by other providers.     HPI Travis Herrera is here for follow up and re-evaluation of chronic medical conditions, medication management and review of any available recent lab and radiology data.  Preventive health is updated, specifically  Cancer screening and Immunization.   Questions or concerns regarding consultations or procedures which the PT has had in the interim are  addressed. The PT denies any adverse reactions to current medications since the last visit.  There are no new concerns.  There are no specific complaints   ROS Denies recent fever or chills. Denies sinus pressure, nasal congestion, ear pain or sore throat. Denies chest congestion, productive cough or wheezing. Denies chest pains, palpitations and leg swelling Denies abdominal pain, nausea, vomiting,diarrhea or constipation.   Denies dysuria, frequency, hesitancy or incontinence. Denies joint pain, swelling and limitation in mobility. Denies headaches, seizures, numbness, or tingling. Denies depression, anxiety or insomnia. Denies skin break down or rash.   PE  BP 121/71   Pulse 64   Ht 5\' 10"  (1.778 m)   Wt 204 lb 0.6 oz (92.6 kg)   SpO2 98%   BMI 29.28 kg/m   Patient alert and oriented and in no cardiopulmonary distress.  HEENT: No facial asymmetry, EOMI,     Neck supple .  Chest: Clear to auscultation bilaterally.  CVS: S1, S2 no murmurs, no S3.Regular rate.  ABD: Soft non tender.   Ext: No edema  MS: Adequate though reduced ROM  ROM spine, shoulders, hips and knees.  Skin: Intact, no ulcerations or rash noted.  Psych: Good eye contact, normal affect. Memory intact not anxious or depressed appearing.  CNS: CN 2-12 intact, power,  normal throughout.no focal deficits noted.   Assessment & Plan  Hypertension goal BP (blood  pressure) < 130/80 Controlled, no change in medication DASH diet and commitment to daily physical activity for a minimum of 30 minutes discussed and encouraged, as a part of hypertension management. The importance of attaining a healthy weight is also discussed.     07/27/2023    8:48 AM 07/26/2023    8:04 AM 04/25/2023    8:04 AM 04/10/2023    9:16 AM 01/17/2023    8:22 AM 11/22/2022    8:27 AM 09/29/2022    7:58 AM  BP/Weight  Systolic BP 121 112 110 120 126 102 103  Diastolic BP 71 60 76 67 80 65 63  Wt. (Lbs) 204.04 205.4 212.8 206 211 207.12 213.4  BMI 29.28 kg/m2 29.47 kg/m2 30.53 kg/m2 29.56 kg/m2 30.28 kg/m2 29.72 kg/m2 31.51 kg/m2       Mixed hyperlipidemia Hyperlipidemia:Low fat diet discussed and encouraged.   Lipid Panel  Lab Results  Component Value Date   CHOL 114 07/26/2023   HDL 36 (L) 07/26/2023   LDLCALC 64 07/26/2023   TRIG 66 07/26/2023   CHOLHDL 3.2 07/26/2023     Controlled, no change in medication   Obesity (BMI 30.0-34.9)  Patient re-educated about  the importance of commitment to a  minimum of 150 minutes of exercise per week as able.  The importance of healthy food choices with portion control discussed, as well as eating regularly and within a 12 hour window most days. The need to choose "clean , green" food 50 to 75% of  the time is discussed, as well as to make water the primary drink and set a goal of 64 ounces water daily.       07/27/2023    8:48 AM 07/26/2023    8:04 AM 04/25/2023    8:04 AM  Weight /BMI  Weight 204 lb 0.6 oz 205 lb 6.4 oz 212 lb 12.8 oz  Height 5\' 10"  (1.778 m) 5\' 10"  (1.778 m) 5\' 10"  (1.778 m)  BMI 29.28 kg/m2 29.47 kg/m2 30.53 kg/m2    Improved, he is congratulated on this  Type 2 diabetes mellitus with stage 2 chronic kidney disease, with long-term current use of insulin [facility]) Travis Herrera is reminded of the importance of commitment to daily physical activity for 30 minutes or more, as able and the need to  limit carbohydrate intake to 30 to 60 grams per meal to help with blood sugar control.   The need to take medication as prescribed, test blood sugar as directed, and to call between visits if there is a concern that blood sugar is uncontrolled is also discussed.   Travis Herrera is reminded of the importance of daily foot exam, annual eye examination, and good blood sugar, blood pressure and cholesterol control. Deteriorated , managed by Endo recent med adjustments, should and needs to improve    Latest Ref Rng & Units 07/26/2023    8:47 AM 07/26/2023    8:17 AM 04/25/2023    8:23 AM 01/29/2023    8:11 AM 01/17/2023    8:42 AM  Diabetic Labs  HbA1c 0.0 - 7.0 %  8.5  8.0   8.3   Chol 100 - 199 mg/dL 098    119    HDL >14 mg/dL 36    36    Calc LDL 0 - 99 mg/dL 64    58    Triglycerides 0 - 149 mg/dL 66    71    Creatinine 0.76 - 1.27 mg/dL 7.82    9.56        21/30/8657    8:48 AM 07/26/2023    8:04 AM 04/25/2023    8:04 AM 04/10/2023    9:16 AM 01/17/2023    8:22 AM 11/22/2022    8:27 AM 09/29/2022    7:58 AM  BP/Weight  Systolic BP 121 112 110 120 126 102 103  Diastolic BP 71 60 76 67 80 65 63  Wt. (Lbs) 204.04 205.4 212.8 206 211 207.12 213.4  BMI 29.28 kg/m2 29.47 kg/m2 30.53 kg/m2 29.56 kg/m2 30.28 kg/m2 29.72 kg/m2 31.51 kg/m2      Latest Ref Rng & Units 06/11/2023   12:00 AM 09/29/2022    8:30 AM  Foot/eye exam completion dates  Eye Exam No Retinopathy No Retinopathy       Foot Form Completion   Done     This result is from an external source.        Vitamin D deficiency Updated lab needed at/ before next visit.   Seasonal allergies Controlled, no change in medication

## 2023-07-29 NOTE — Assessment & Plan Note (Signed)
Hyperlipidemia:Low fat diet discussed and encouraged.   Lipid Panel  Lab Results  Component Value Date   CHOL 114 07/26/2023   HDL 36 (L) 07/26/2023   LDLCALC 64 07/26/2023   TRIG 66 07/26/2023   CHOLHDL 3.2 07/26/2023     Controlled, no change in medication

## 2023-07-29 NOTE — Assessment & Plan Note (Signed)
Controlled, no change in medication

## 2023-07-29 NOTE — Assessment & Plan Note (Signed)
  Patient re-educated about  the importance of commitment to a  minimum of 150 minutes of exercise per week as able.  The importance of healthy food choices with portion control discussed, as well as eating regularly and within a 12 hour window most days. The need to choose "clean , green" food 50 to 75% of the time is discussed, as well as to make water the primary drink and set a goal of 64 ounces water daily.       07/27/2023    8:48 AM 07/26/2023    8:04 AM 04/25/2023    8:04 AM  Weight /BMI  Weight 204 lb 0.6 oz 205 lb 6.4 oz 212 lb 12.8 oz  Height 5\' 10"  (1.778 m) 5\' 10"  (1.778 m) 5\' 10"  (1.778 m)  BMI 29.28 kg/m2 29.47 kg/m2 30.53 kg/m2    Improved, he is congratulated on this

## 2023-08-07 ENCOUNTER — Ambulatory Visit (INDEPENDENT_AMBULATORY_CARE_PROVIDER_SITE_OTHER): Payer: PPO

## 2023-08-07 DIAGNOSIS — Z23 Encounter for immunization: Secondary | ICD-10-CM

## 2023-08-08 DIAGNOSIS — E1122 Type 2 diabetes mellitus with diabetic chronic kidney disease: Secondary | ICD-10-CM | POA: Diagnosis not present

## 2023-08-08 DIAGNOSIS — N1831 Chronic kidney disease, stage 3a: Secondary | ICD-10-CM | POA: Diagnosis not present

## 2023-08-08 DIAGNOSIS — N189 Chronic kidney disease, unspecified: Secondary | ICD-10-CM | POA: Diagnosis not present

## 2023-08-08 DIAGNOSIS — E1129 Type 2 diabetes mellitus with other diabetic kidney complication: Secondary | ICD-10-CM | POA: Diagnosis not present

## 2023-08-08 DIAGNOSIS — R809 Proteinuria, unspecified: Secondary | ICD-10-CM | POA: Diagnosis not present

## 2023-08-08 DIAGNOSIS — N170 Acute kidney failure with tubular necrosis: Secondary | ICD-10-CM | POA: Diagnosis not present

## 2023-08-09 ENCOUNTER — Telehealth: Payer: Self-pay | Admitting: Nurse Practitioner

## 2023-08-09 NOTE — Telephone Encounter (Signed)
Pt's wife picked up his meds.

## 2023-08-09 NOTE — Telephone Encounter (Signed)
Pt called and made aware that his pt assistance is here for pick up

## 2023-08-10 ENCOUNTER — Other Ambulatory Visit: Payer: Self-pay | Admitting: Family Medicine

## 2023-08-10 ENCOUNTER — Telehealth: Payer: Self-pay | Admitting: Nurse Practitioner

## 2023-08-10 NOTE — Telephone Encounter (Signed)
Pt picked up.

## 2023-08-10 NOTE — Telephone Encounter (Signed)
Called and let pt know pt assistance of Evaristo Bury was ready for pick up

## 2023-08-15 DIAGNOSIS — E1122 Type 2 diabetes mellitus with diabetic chronic kidney disease: Secondary | ICD-10-CM | POA: Diagnosis not present

## 2023-08-15 DIAGNOSIS — E559 Vitamin D deficiency, unspecified: Secondary | ICD-10-CM | POA: Diagnosis not present

## 2023-08-15 DIAGNOSIS — N1831 Chronic kidney disease, stage 3a: Secondary | ICD-10-CM | POA: Diagnosis not present

## 2023-08-15 DIAGNOSIS — R809 Proteinuria, unspecified: Secondary | ICD-10-CM | POA: Diagnosis not present

## 2023-08-21 ENCOUNTER — Telehealth: Payer: Self-pay | Admitting: Nurse Practitioner

## 2023-08-21 NOTE — Telephone Encounter (Signed)
Picked up meds/needles

## 2023-08-21 NOTE — Telephone Encounter (Signed)
Called pt to let him know that his pen needles and pt assistance ozempic is here for pick up

## 2023-09-11 ENCOUNTER — Other Ambulatory Visit: Payer: Self-pay | Admitting: Family Medicine

## 2023-09-14 ENCOUNTER — Other Ambulatory Visit: Payer: Self-pay | Admitting: Family Medicine

## 2023-09-19 ENCOUNTER — Other Ambulatory Visit: Payer: Self-pay | Admitting: Family Medicine

## 2023-09-25 ENCOUNTER — Other Ambulatory Visit: Payer: Self-pay

## 2023-09-25 MED ORDER — FREESTYLE LIBRE 2 SENSOR MISC: 2 refills | Status: DC

## 2023-09-25 MED ORDER — FREESTYLE LIBRE READER DEVI: 5 refills | Status: AC

## 2023-10-22 ENCOUNTER — Other Ambulatory Visit: Payer: Self-pay | Admitting: Family Medicine

## 2023-10-23 ENCOUNTER — Telehealth: Payer: Self-pay | Admitting: Nurse Practitioner

## 2023-10-23 ENCOUNTER — Other Ambulatory Visit: Payer: Self-pay | Admitting: Nurse Practitioner

## 2023-10-23 MED ORDER — ACCU-CHEK GUIDE ME W/DEVICE KIT: PACK | 0 refills | Status: DC

## 2023-10-23 NOTE — Telephone Encounter (Signed)
 Noted.

## 2023-11-12 ENCOUNTER — Telehealth: Payer: Self-pay

## 2023-11-12 NOTE — Progress Notes (Signed)
Care Guide Pharmacy Note  11/12/2023 Name: Travis Herrera MRN: [id number] DOB: 15-Aug-1956  Referred By: Kerri Perches, MD Reason for referral: Care Coordination ([facility] Diabetes. )   Travis Herrera is a 68 y.o. year old male who is a primary care patient of Kerri Perches, MD.  Travis Herrera was referred to the pharmacist for assistance related to: DMII  Successful contact was made with the patient to discuss pharmacy services.  Patient declines engagement at this time. Contact information was provided to the patient should they wish to reach out for assistance at a later time.  [facility]  [facility], [facility] Health Care Management Assistant Direct Dial: [phone]  Fax: [phone]

## 2023-11-19 ENCOUNTER — Other Ambulatory Visit: Payer: Self-pay | Admitting: Nurse Practitioner

## 2023-11-23 ENCOUNTER — Other Ambulatory Visit: Payer: Self-pay | Admitting: Family Medicine

## 2023-11-26 MED ORDER — AMLODIPINE BESYLATE 10 MG PO TABS: 10.0000 mg | ORAL_TABLET | Freq: Every day | ORAL | 0 refills | Status: DC

## 2023-11-27 ENCOUNTER — Ambulatory Visit: Payer: PPO | Admitting: Nurse Practitioner

## 2023-11-27 ENCOUNTER — Encounter: Payer: Self-pay | Admitting: Nurse Practitioner

## 2023-11-27 VITALS — BP 110/70 | HR 77 | Ht 70.0 in | Wt 191.8 lb

## 2023-11-27 DIAGNOSIS — Z7984 Long term (current) use of oral hypoglycemic drugs: Secondary | ICD-10-CM

## 2023-11-27 DIAGNOSIS — Z794 Long term (current) use of insulin: Secondary | ICD-10-CM | POA: Diagnosis not present

## 2023-11-27 DIAGNOSIS — N1832 Chronic kidney disease, stage 3b: Secondary | ICD-10-CM | POA: Diagnosis not present

## 2023-11-27 DIAGNOSIS — N1831 Chronic kidney disease, stage 3a: Secondary | ICD-10-CM

## 2023-11-27 DIAGNOSIS — E1122 Type 2 diabetes mellitus with diabetic chronic kidney disease: Secondary | ICD-10-CM

## 2023-11-27 DIAGNOSIS — I10 Essential (primary) hypertension: Secondary | ICD-10-CM

## 2023-11-27 DIAGNOSIS — Z7985 Long-term (current) use of injectable non-insulin antidiabetic drugs: Secondary | ICD-10-CM

## 2023-11-27 DIAGNOSIS — E782 Mixed hyperlipidemia: Secondary | ICD-10-CM

## 2023-11-27 LAB — POCT GLYCOSYLATED HEMOGLOBIN (HGB A1C): Hemoglobin A1C: 9.2 % — AB (ref 4.0–5.6)

## 2023-11-27 NOTE — Progress Notes (Signed)
11/27/2023  Endocrinology follow-up note   Subjective:    Patient ID: Travis Herrera, male    DOB: [birth year],    Past Medical History:  Diagnosis Date   COVID-19 03/29/2021   Diabetes mellitus without complication (HCC) 10/16/1993   insulin started in 2012   Hyperlipemia 10/17/2011   Hypertension 10/16/2008   Past Surgical History:  Procedure Laterality Date   CATARACT EXTRACTION Right 08/26/2021   CATARACT EXTRACTION W/PHACO  07/29/2012   Procedure: CATARACT EXTRACTION PHACO AND INTRAOCULAR LENS PLACEMENT (IOC);  Surgeon: Gemma Payor, MD;  Location: [facility];  Service: Ophthalmology;  Laterality: Left;  CDE=1.66   CHOLECYSTECTOMY  2007   [name]   EYE SURGERY Left 2013   cataract   QUADRICEPS TENDON REPAIR Right 03/19/2018   Procedure: REPAIR QUADRICEP TENDON;  Surgeon: Vickki Hearing, MD;  Location: [facility];  Service: Orthopedics;  Laterality: Right;   Social History   Socioeconomic History   Marital status: Married    Spouse name: Not on file   Number of children: 4   Years of education: Not on file   Highest education level: Not on file  Occupational History   Not on file  Tobacco Use   Smoking status: Never   Smokeless tobacco: Never  Vaping Use   Vaping status: Never Used  Substance and Sexual Activity   Alcohol use: No   Drug use: No   Sexual activity: Yes    Birth control/protection: None  Other Topics Concern   Not on file  Social History Narrative   Not on file   Social Drivers of Health   Financial Resource Strain: Low Risk  (04/10/2023)   Overall Financial Resource Strain (CARDIA)    Difficulty of Paying Living Expenses: Not hard at all  Food Insecurity: No Food Insecurity (04/10/2023)   Hunger Vital Sign    Worried About Running Out of Food in the Last Year: Never true    Ran Out of Food in the Last Year: Never true  Transportation Needs: No Transportation Needs (04/10/2023)   PRAPARE - Administrator, Civil Service  (Medical): No    Lack of Transportation (Non-Medical): No  Physical Activity: Sufficiently Active (04/10/2023)   Exercise Vital Sign    Days of Exercise per Week: 7 days    Minutes of Exercise per Session: 60 min  Stress: No Stress Concern Present (04/10/2023)   [employer] of Occupational Health - Occupational Stress Questionnaire    Feeling of Stress : Not at all  Social Connections: Socially Integrated (04/10/2023)   Social Connection and Isolation Panel [NHANES]    Frequency of Communication with Friends and Family: More than three times a week    Frequency of Social Gatherings with Friends and Family: More than three times a week    Attends Religious Services: More than 4 times per year    Active Member of [employer] or Organizations: Yes    Attends Banker Meetings: More than 4 times per year    Marital Status: Married   Outpatient Encounter Medications as of 11/27/2023  Medication Sig   amLODipine (NORVASC) 10 MG tablet Take 1 tablet (10 mg total) by mouth daily.   aspirin EC 81 MG tablet Take 81 mg by mouth daily.   benazepril (LOTENSIN) 20 MG tablet Take 1 tablet by mouth once daily   Blood Glucose Monitoring Suppl (ONETOUCH VERIO FLEX SYSTEM) w/Device KIT USE   TO CHECK GLUCOSE TWICE DAILY (AS  BACKUP  FOR  LIBRE)   Continuous Glucose Receiver (FREESTYLE LIBRE READER) DEVI Use to check blood sugar daily dx E11.65   Continuous Glucose Sensor (FREESTYLE LIBRE 2 SENSOR) MISC USE AS DIRECTED TO  CHECK  BLOOD  SUGAR  DAILY   FARXIGA 5 MG TABS tablet Take 1 tablet (5 mg total) by mouth every morning. (Patient taking differently: Take 2.5 mg by mouth every morning.)   glucose blood (ONETOUCH VERIO) test strip USE 1 STRIP TO CHECK GLUCOSE THREE TIMES DAILY AS DIRECTED DX e11.65   Insulin Pen Needle 31G X 8 MM MISC 1 each by Does not apply route 2 (two) times daily.   Lancets (FREESTYLE) lancets Use as instructed bid   montelukast (SINGULAIR) 10 MG tablet Take 1 tablet (10 mg  total) by mouth at bedtime.   ONETOUCH DELICA LANCETS 33G MISC Three times daily testing dx e11.65   rosuvastatin (CRESTOR) 10 MG tablet Take 1 tablet by mouth once daily   Semaglutide,0.25 or 0.5MG /DOS, (OZEMPIC, 0.25 OR 0.5 MG/DOSE,) 2 MG/1.5ML SOPN Inject 1 mg into the skin once a week.   triamterene-hydrochlorothiazide (MAXZIDE-25) 37.5-25 MG tablet Take 1 tablet by mouth once daily   UNABLE TO FIND Med Name: Power scooter  DX: M17.12   vitamin C (ASCORBIC ACID) 500 MG tablet Take 500 mg by mouth daily.   insulin degludec (TRESIBA FLEXTOUCH) 100 UNIT/ML FlexTouch Pen Inject 40 Units into the skin at bedtime. (Patient not taking: Reported on 11/27/2023)   No facility-administered encounter medications on file as of 11/27/2023.   ALLERGIES: Allergies  Allergen Reactions   Hydrocodone    Oxycodone    VACCINATION STATUS: Immunization History  Administered Date(s) Administered   Influenza Split 07/17/2014   Influenza, Seasonal, Injecte, Preservative Fre 08/07/2023   Influenza,inj,Quad PF,6+ Mos 08/16/2015, 07/26/2016, 08/02/2017, 07/19/2018, 06/16/2019, 07/02/2020, 11/22/2022   Influenza-Unspecified 08/02/2021   Moderna Sars-Covid-2 Vaccination 12/27/2019, 01/28/2020   PFIZER(Purple Top)SARS-COV-2 Vaccination 09/24/2020   PNEUMOCOCCAL CONJUGATE-20 10/26/2021   Pneumococcal Polysaccharide-23 09/15/2019    Diabetes He presents for his follow-up diabetic visit. He has type 2 diabetes mellitus. Onset time: He was diagnosed at approximate age of 32 years. His disease course has been fluctuating. There are no hypoglycemic associated symptoms. Pertinent negatives for hypoglycemia include no confusion, headaches, pallor or seizures. Pertinent negatives for diabetes include no chest pain, no fatigue, no polydipsia, no polyphagia, no polyuria and no weakness. There are no hypoglycemic complications. Symptoms are stable. Diabetic complications include nephropathy. Risk factors for coronary artery  disease include diabetes mellitus, dyslipidemia, hypertension, family history, male sex, sedentary lifestyle and obesity. Current diabetic treatment includes oral agent (monotherapy) (and Ozempic). He is compliant with treatment most of the time. His weight is fluctuating minimally. He is following a generally healthy diet. When asked about meal planning, he reported none. He has not had a previous visit with a dietitian. He rarely participates in exercise. His home blood glucose trend is increasing steadily. His breakfast blood glucose range is generally 140-180 mg/dl. His bedtime blood glucose range is generally 180-200 mg/dl. His overall blood glucose range is 180-200 mg/dl. (He presents today, accompanied by his wife, with his CGM showing slightly above target glycemia profile overall.  His POCT A1c today is 9.2%, increasing slightly from last visit of 9.1%.  Since last visit he did go on a cruise and thinks he had the flu last week.  He did stop by between visits for low readings and his Evaristo Bury was stopped.) An ACE inhibitor/angiotensin II receptor blocker is  being taken. He does not see a podiatrist.Eye exam is current.  Hypertension This is a chronic problem. The current episode started more than 1 year ago. The problem has been gradually improving since onset. The problem is controlled. Pertinent negatives include no chest pain, headaches, neck pain, palpitations or shortness of breath. There are no associated agents to hypertension. Risk factors for coronary artery disease include dyslipidemia, diabetes mellitus, male gender, obesity and sedentary lifestyle. Past treatments include ACE inhibitors, diuretics and calcium channel blockers. The current treatment provides moderate improvement. There are no compliance problems.  Hypertensive end-organ damage includes kidney disease. Identifiable causes of hypertension include chronic renal disease.  Hyperlipidemia This is a chronic problem. The current  episode started more than 1 year ago. The problem is controlled. Recent lipid tests were reviewed and are normal. Exacerbating diseases include chronic renal disease, diabetes and obesity. Factors aggravating his hyperlipidemia include fatty foods. Pertinent negatives include no chest pain, myalgias or shortness of breath. Current antihyperlipidemic treatment includes statins. The current treatment provides moderate improvement of lipids. Compliance problems include adherence to diet and adherence to exercise.  Risk factors for coronary artery disease include dyslipidemia, diabetes mellitus, hypertension, male sex, a sedentary lifestyle and obesity.    Review of systems  Constitutional: + decreasing body weight,  current Body mass index is 27.52 kg/m. , no fatigue, no subjective hyperthermia, no subjective hypothermia Eyes: no blurry vision, no xerophthalmia ENT: no sore throat, no nodules palpated in throat, no dysphagia/odynophagia, no hoarseness Cardiovascular: no chest pain, no shortness of breath, no palpitations, no leg swelling Respiratory: no cough, no shortness of breath Gastrointestinal: no nausea/vomiting/diarrhea Musculoskeletal: no muscle/joint aches Skin: no rashes, no hyperemia Neurological: no tremors, no numbness, no tingling, no dizziness Psychiatric: no depression, no anxiety    Objective:    BP 110/70 (BP Location: Left Arm, Patient Position: Sitting, Cuff Size: Large)   Pulse 77   Ht 5\' 10"  (1.778 m)   Wt 191 lb 12.8 oz (87 kg)   BMI 27.52 kg/m   Wt Readings from Last 3 Encounters:  11/27/23 191 lb 12.8 oz (87 kg)  07/27/23 204 lb 0.6 oz (92.6 kg)  07/26/23 205 lb 6.4 oz (93.2 kg)    BP Readings from Last 3 Encounters:  11/27/23 110/70  07/27/23 121/71  07/26/23 112/60      Physical Exam- Limited  Constitutional:  Body mass index is 27.52 kg/m. , not in acute distress, normal state of mind Eyes:  EOMI, no exophthalmos Musculoskeletal: no gross  deformities, strength intact in all four extremities, no gross restriction of joint movements Skin:  no rashes, no hyperemia Neurological: no tremor with outstretched hands    Diabetic Foot Exam - Simple   No data filed     Results for orders placed or performed in visit on 11/27/23  HgB A1c   Collection Time: 11/27/23  1:38 PM  Result Value Ref Range   Hemoglobin A1C 9.2 (A) 4.0 - 5.6 %   HbA1c POC (<> result, manual entry)     HbA1c, POC (prediabetic range)     HbA1c, POC (controlled diabetic range)     Diabetic Labs (most recent): Lab Results  Component Value Date   HGBA1C 9.2 (A) 11/27/2023   HGBA1C 8.5 (A) 07/26/2023   HGBA1C 8.0 (A) 04/25/2023   MICROALBUR 1.0 10/31/2019   MICROALBUR 1.3 12/06/2018   MICROALBUR 1.5 10/24/2016   Lipid Panel     Component Value Date/Time   CHOL 114 07/26/2023 0847  TRIG 66 07/26/2023 0847   HDL 36 (L) 07/26/2023 0847   CHOLHDL 3.2 07/26/2023 0847   CHOLHDL 3.2 07/13/2020 0840   VLDL 12 01/16/2017 0940   LDLCALC 64 07/26/2023 0847   LDLCALC 36 07/13/2020 0840      Assessment & Plan:   1) Diabetes mellitus type 2 without complications, with long term insulin use (HCC)  He presents today, accompanied by his wife, with his CGM showing slightly above target glycemia profile overall.  His POCT A1c today is 9.2%, increasing slightly from last visit of 9.1%.  Since last visit he did go on a cruise and thinks he had the flu last week.  He did stop by between visits for low readings and his Evaristo Bury was stopped.  Analysis of his CGM shows TIR 40%, TAR 60%, TBR 0%, with a GMI of 8%.    Recent labs reviewed, showing mild renal insufficiency (stable).  - Patient remains at a high risk for more acute and chronic complications of diabetes which include CAD, CVA, CKD, retinopathy, and neuropathy. These are all discussed in detail with the patient.  - Nutritional counseling repeated at each appointment due to patients tendency to fall back in  to old habits.  - The patient admits there is a room for improvement in their diet and drink choices. -  Suggestion is made for the patient to avoid simple carbohydrates from their diet including Cakes, Sweet Desserts / Pastries, Ice Cream, Soda (diet and regular), Sweet Tea, Candies, Chips, Cookies, Sweet Pastries, Store Bought Juices, Alcohol in Excess of 1-2 drinks a day, Artificial Sweeteners, Coffee Creamer, and "Sugar-free" Products. This will help patient to have stable blood glucose profile and potentially avoid unintended weight gain.   - I encouraged the patient to switch to unprocessed or minimally processed complex starch and increased protein intake (animal or plant source), fruits, and vegetables.   - Patient is advised to stick to a routine mealtimes to eat 3 meals a day and avoid unnecessary snacks (to snack only to correct hypoglycemia).  - I have approached patient with the following individualized plan to manage diabetes and patient agrees.  He is advised to restart Tresiba 10 units SQ nightly and continue Ozempic 1 mg SQ weekly (gets these from patient assistance).  He is advised to continue his Farxiga 2.5 mg po daily (at advice of his nephrologist).  -He is advised to continue monitoring blood glucose at least twice daily using his CGM, before breakfast and before bed, and call the clinic if he gets readings less than 70 or greater than 200 for 3 tests in a row.  - Patient specific target  for A1c; LDL, HDL, Triglycerides, and  Waist Circumference were discussed in detail.  2) BP/HTN: His blood pressure is controlled to target.  He is advised to continue Amlodipine 10 mg po daily, Benazepril 20 mg po daily, and Triamterene-HCT 37.5-25 mg po daily.  3) Lipids/HPL: Most recent lipid panel from 07/26/23 shows controlled LDL at 64.  He is advised to continue Crestor 10 mg po daily at bedtime.  Side effects and precautions discussed with him.  He is advised to avoid fried foods  and butter.    4)  Weight/Diet:  His Body mass index is 27.52 kg/m.-is a candidate for modest weight loss.  CDE consult in progress, exercise, and carbohydrates information provided.  5) Chronic Care/Health Maintenance: -Patient on ACEI and Statin medications and encouraged to continue to follow up with Ophthalmology, Podiatrist at least yearly  or according to recommendations, and advised to stay away from smoking. I have recommended yearly flu vaccine and pneumonia vaccination at least every 5 years; moderate intensity exercise for up to 150 minutes weekly; and  sleep for at least 7 hours a day.  I advised patient to maintain close follow up with his PCP for primary care needs.     I spent  30  minutes in the care of the patient today including review of labs from CMP, Lipids, Thyroid Function, Hematology (current and previous including abstractions from other facilities); face-to-face time discussing  his blood glucose readings/logs, discussing hypoglycemia and hyperglycemia episodes and symptoms, medications doses, his options of short and long term treatment based on the latest standards of care / guidelines;  discussion about incorporating lifestyle medicine;  and documenting the encounter. Risk reduction counseling performed per USPSTF guidelines to reduce obesity and cardiovascular risk factors.     Please refer to Patient Instructions for Blood Glucose Monitoring and Insulin/Medications Dosing Guide"  in media tab for additional information. Please  also refer to " Patient Self Inventory" in the Media  tab for reviewed elements of pertinent patient history.  Travis Herrera participated in the discussions, expressed understanding, and voiced agreement with the above plans.  All questions were answered to his satisfaction. he is encouraged to contact clinic should he have any questions or concerns prior to his return visit.    Follow up plan: Return in about 4 months (around 03/26/2024)  for Diabetes F/U with A1c in office, Bring meter and logs, No previsit labs.   Ronny Bacon, [facility] [address] [place], [place] [address] Phone: [phone] Fax: [phone]  11/27/2023, 1:50 PM

## 2023-12-12 ENCOUNTER — Telehealth: Payer: Self-pay

## 2023-12-12 NOTE — Telephone Encounter (Signed)
 Patient was identified as falling into the [place] Measure - Diabetes.   Patient was: Appointment scheduled for lab or office visit for A1c.   Also followed by ENDO.

## 2023-12-24 ENCOUNTER — Other Ambulatory Visit: Payer: Self-pay | Admitting: Family Medicine

## 2023-12-24 MED ORDER — ROSUVASTATIN CALCIUM 10 MG PO TABS: 10.0000 mg | ORAL_TABLET | Freq: Every day | ORAL | 0 refills | Status: DC

## 2024-01-06 ENCOUNTER — Other Ambulatory Visit: Payer: Self-pay | Admitting: Family Medicine

## 2024-01-15 ENCOUNTER — Other Ambulatory Visit: Payer: Self-pay | Admitting: Family Medicine

## 2024-01-22 IMAGING — DX DG BONE SURVEY MET
9 of 10 series · 9 of 10 positions shown · non-contrast
Comparison: None.

CLINICAL DATA: MGUS

EXAM:
METASTATIC BONE SURVEY

[skull lat]
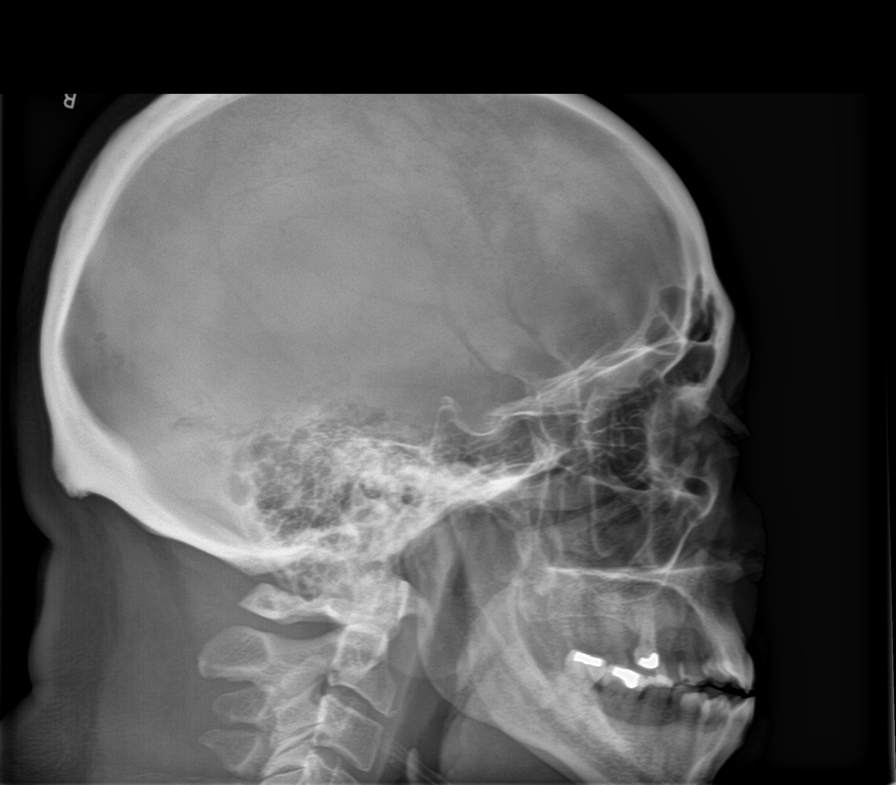

[shoulder ap (1 of 2)]
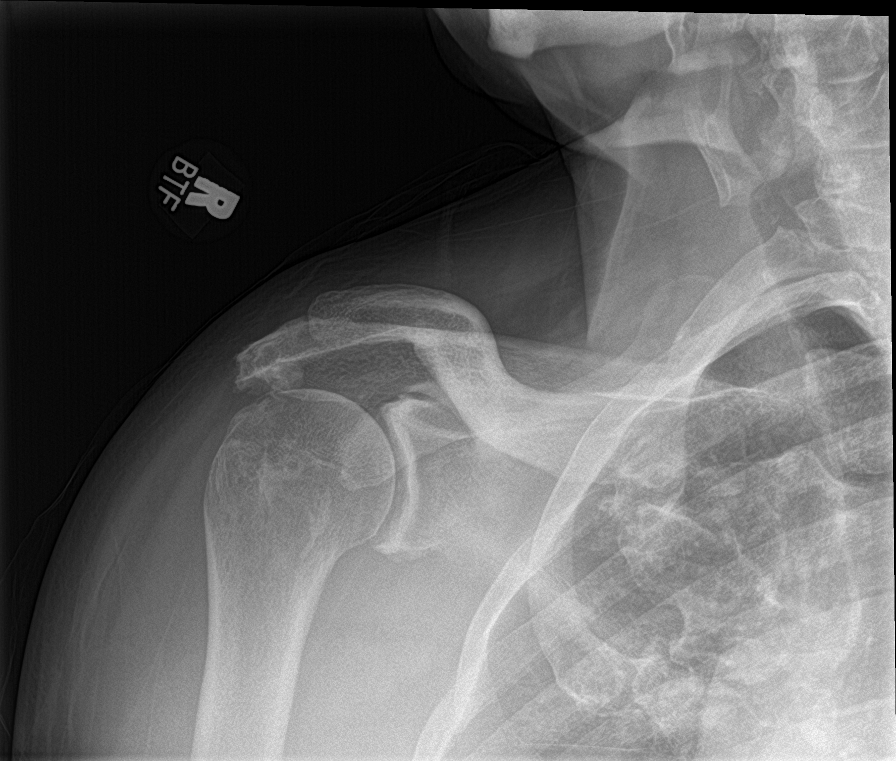

[shoulder ap (2 of 2)]
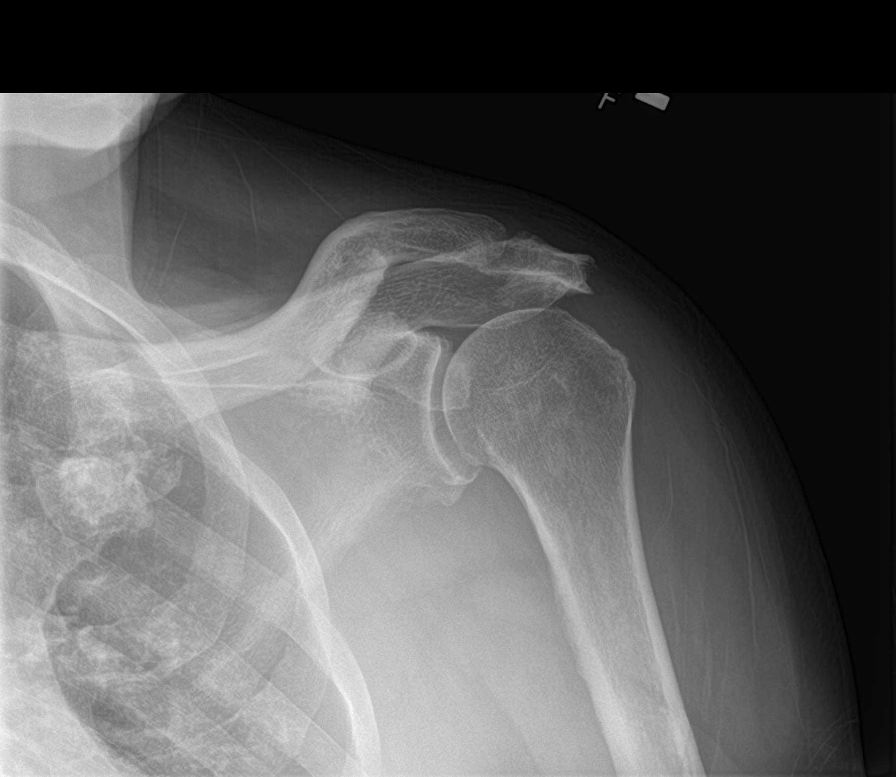

[humerus ap (1 of 2)]
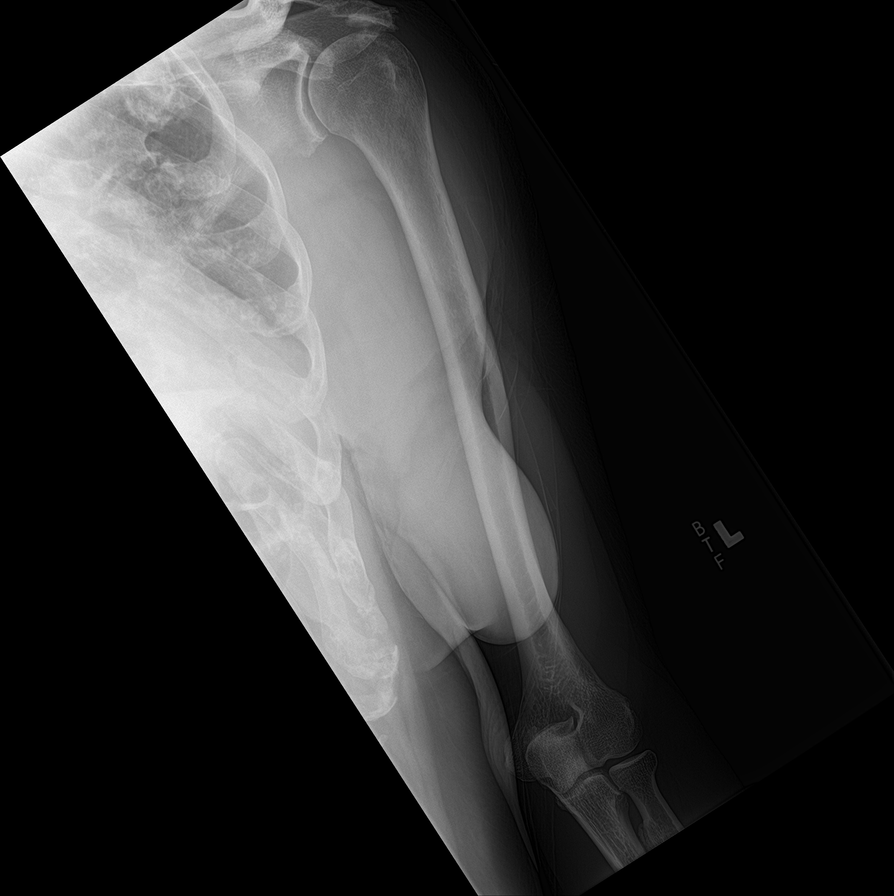

[humerus ap (2 of 2)]
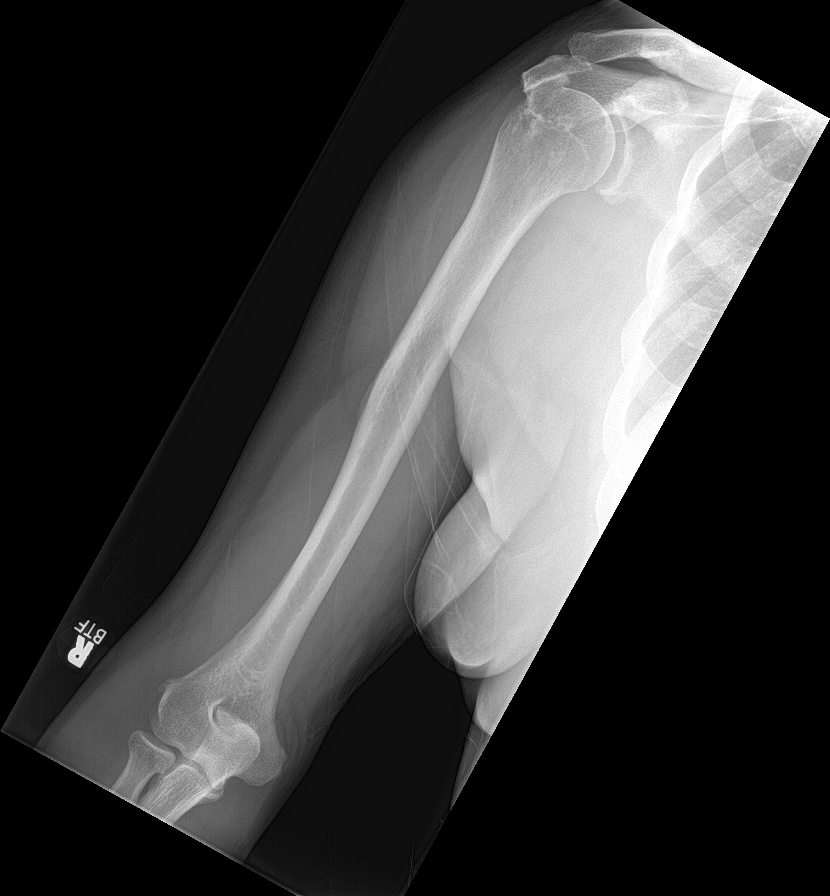

[forearm ap (1 of 2)]
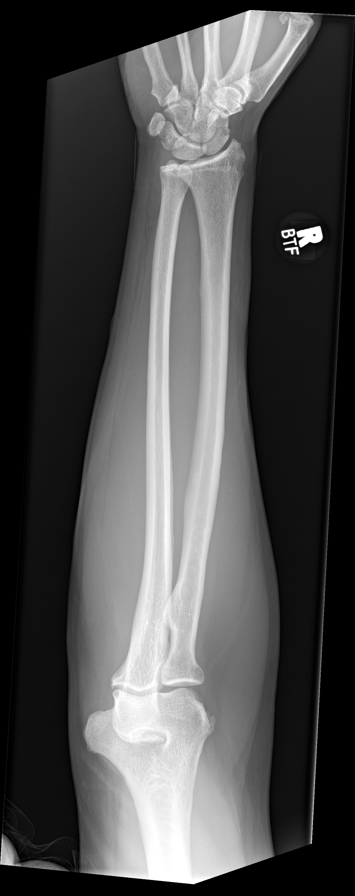

[forearm ap (2 of 2)]
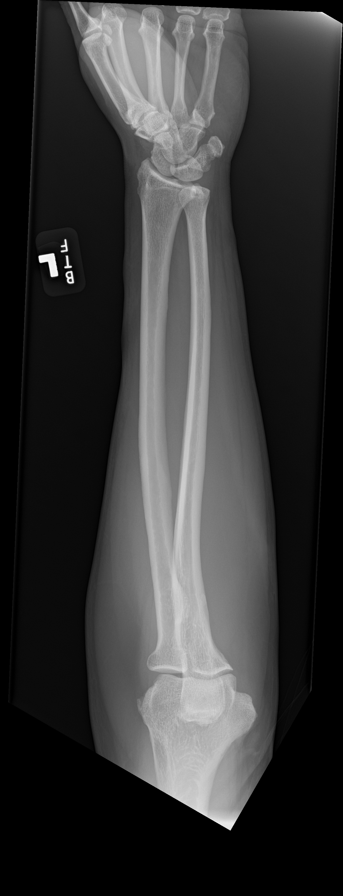

[c-spine ap]
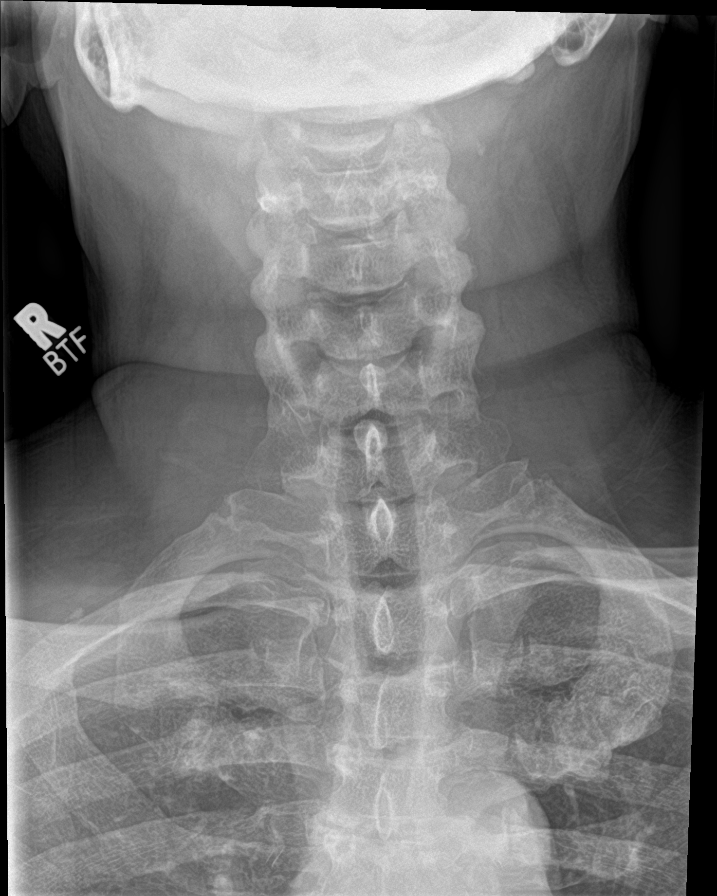

[c-spine lat]
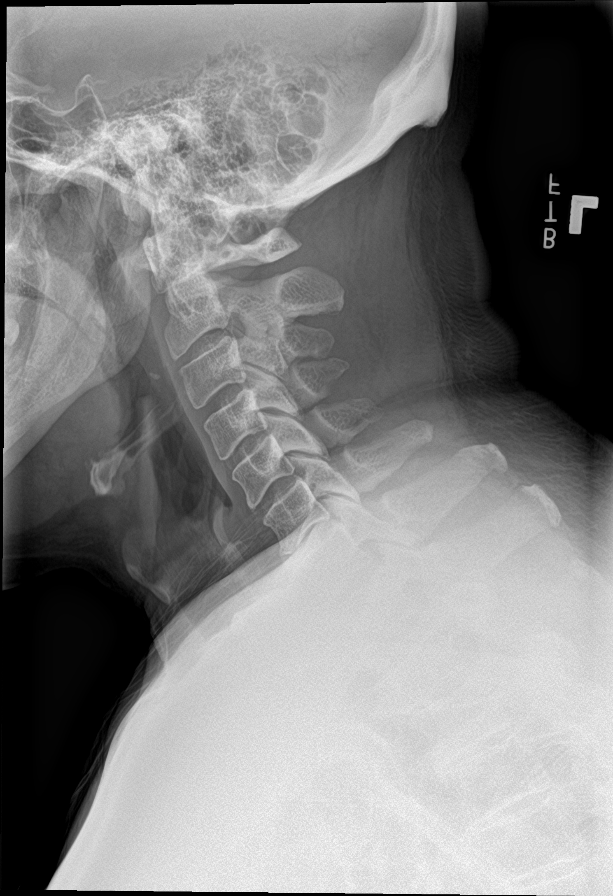

[9 of 10 positions shown; findings below may reference images not displayed]

FINDINGS: Heart is normal size.  Lungs clear.  No effusions.

Rounded lucency noted in the right femoral neck, most likely Harold
Prbruno pit related to degenerative changes. Mild degenerative
changes in the hips bilaterally. No additional suspicious lucent
lesion. No acute bony abnormality. Degenerative changes in the knees
bilaterally as well as the AC joints bilaterally. Mild degenerative
changes throughout the thoracic and lumbar spine.
IMPRESSION: No suspicious focal lytic lesion or acute bony abnormality.

## 2024-01-24 DIAGNOSIS — R809 Proteinuria, unspecified: Secondary | ICD-10-CM | POA: Diagnosis not present

## 2024-01-24 DIAGNOSIS — E1129 Type 2 diabetes mellitus with other diabetic kidney complication: Secondary | ICD-10-CM | POA: Diagnosis not present

## 2024-01-24 DIAGNOSIS — E1122 Type 2 diabetes mellitus with diabetic chronic kidney disease: Secondary | ICD-10-CM | POA: Diagnosis not present

## 2024-01-24 DIAGNOSIS — N1831 Chronic kidney disease, stage 3a: Secondary | ICD-10-CM | POA: Diagnosis not present

## 2024-01-24 DIAGNOSIS — N170 Acute kidney failure with tubular necrosis: Secondary | ICD-10-CM | POA: Diagnosis not present

## 2024-01-30 ENCOUNTER — Other Ambulatory Visit: Payer: Self-pay | Admitting: Family Medicine

## 2024-01-30 DIAGNOSIS — I129 Hypertensive chronic kidney disease with stage 1 through stage 4 chronic kidney disease, or unspecified chronic kidney disease: Secondary | ICD-10-CM | POA: Diagnosis not present

## 2024-01-30 DIAGNOSIS — E1122 Type 2 diabetes mellitus with diabetic chronic kidney disease: Secondary | ICD-10-CM | POA: Diagnosis not present

## 2024-01-30 DIAGNOSIS — R809 Proteinuria, unspecified: Secondary | ICD-10-CM | POA: Diagnosis not present

## 2024-01-30 DIAGNOSIS — N1831 Chronic kidney disease, stage 3a: Secondary | ICD-10-CM | POA: Diagnosis not present

## 2024-02-05 ENCOUNTER — Encounter: Payer: Self-pay | Admitting: Family Medicine

## 2024-02-12 ENCOUNTER — Ambulatory Visit (INDEPENDENT_AMBULATORY_CARE_PROVIDER_SITE_OTHER): Payer: PPO | Admitting: Family Medicine

## 2024-02-12 ENCOUNTER — Encounter: Payer: Self-pay | Admitting: Family Medicine

## 2024-02-12 VITALS — BP 110/69 | HR 59 | Resp 18 | Ht 69.0 in | Wt 194.1 lb

## 2024-02-12 DIAGNOSIS — F411 Generalized anxiety disorder: Secondary | ICD-10-CM

## 2024-02-12 DIAGNOSIS — Z794 Long term (current) use of insulin: Secondary | ICD-10-CM | POA: Diagnosis not present

## 2024-02-12 DIAGNOSIS — E1122 Type 2 diabetes mellitus with diabetic chronic kidney disease: Secondary | ICD-10-CM

## 2024-02-12 DIAGNOSIS — F322 Major depressive disorder, single episode, severe without psychotic features: Secondary | ICD-10-CM | POA: Diagnosis not present

## 2024-02-12 DIAGNOSIS — Z0001 Encounter for general adult medical examination with abnormal findings: Secondary | ICD-10-CM

## 2024-02-12 DIAGNOSIS — N182 Chronic kidney disease, stage 2 (mild): Secondary | ICD-10-CM | POA: Diagnosis not present

## 2024-02-12 MED ORDER — ESCITALOPRAM OXALATE 10 MG PO TABS: 10.0000 mg | ORAL_TABLET | Freq: Every day | ORAL | 1 refills | Status: AC

## 2024-02-12 NOTE — Patient Instructions (Signed)
 Follow-up in 6 weeks to reevaluate depression and anxiety, call if you need me sooner.  New medication for mental health is Lexapro take 1 tablet once daily.  Please use all resources including regular exercise as well as support from family ,friends and spiritual leaders to help as you deal with challenging situation.  I will get lab work recently done from your nephrologist before you get updated labs from me.  I will send a message via MyChart.when I know whT LABS YOU NEED  It is important that you exercise regularly at least 30 minutes 5 times a week. If you develop chest pain, have severe difficulty breathing, or feel very tired, stop exercising immediately and seek medical attention   Thanks for choosing [facility], we consider it a privelige to serve you.

## 2024-02-14 ENCOUNTER — Encounter: Payer: Self-pay | Admitting: Family Medicine

## 2024-02-14 DIAGNOSIS — F322 Major depressive disorder, single episode, severe without psychotic features: Secondary | ICD-10-CM | POA: Insufficient documentation

## 2024-02-14 DIAGNOSIS — F411 Generalized anxiety disorder: Secondary | ICD-10-CM | POA: Insufficient documentation

## 2024-02-14 NOTE — Assessment & Plan Note (Signed)

## 2024-02-14 NOTE — Assessment & Plan Note (Signed)
 Uncontrolled start lexapro  and utilize community resources

## 2024-02-14 NOTE — Progress Notes (Signed)
 Travis Herrera     MRN: [id number]      DOB: Mar 19, 1956  Chief Complaint  Patient presents with   Anxiety    Has been having some anxiety since taking care of his father with dementia. Would like to discuss something to "take the edge off"    Annual Exam    CPE    HPI: Patient is in for annual physical exam. Anxiety and depression are addressed. Immunization is reviewed , and  updated deferring/ declining recommended vaccines at this time Will get recent labs from Nephrology before ordering.    PE; BP 110/69   Pulse (!) 59   Resp 18   Ht 5\' 9"  (1.753 m)   Wt 194 lb 1.3 oz (88 kg)   SpO2 98%   BMI 28.66 kg/m   Pleasant male, alert and oriented x 3, in no cardio-pulmonary distress. Afebrile. HEENT No facial trauma or asymetry. Sinuses non tender. EOMI External ears normal,  Neck: supple, no adenopathy,JVD or thyromegaly.No bruits.  Chest: Clear to ascultation bilaterally.No crackles or wheezes. Non tender to palpation  Cardiovascular system; Heart sounds normal,  S1 and  S2 ,no S3.  No murmur, or thrill. Apical beat not displaced Peripheral pulses normal.  Abdomen: Soft, non tender,   Musculoskeletal exam: Full ROM of spine, hips , shoulders and  reduced in knees. deformity ,swelling and  crepitus noted. No muscle wasting or atrophy.   Neurologic: Cranial nerves 2 to 12 intact. Power, tone ,sensation  normal throughout. Disturbance in gait. No tremor.  Skin: Intact, no ulceration, erythema , scaling or rash noted. Pigmentation normal throughout  Psych; Normal mood and affect. Judgement and concentration normal Positive depression and anxiety screen, not suicidal or homicidal   Assessment & Plan:  Annual visit for general adult medical examination with abnormal findings Annual exam as documented. Counseling done  re healthy lifestyle involving commitment to 150 minutes exercise per week, heart healthy diet, and attaining healthy weight.The  importance of adequate sleep also discussed. Regular seat belt use and home safety, is also discussed. Changes in health habits are decided on by the patient with goals and time frames  set for achieving them. Immunization and cancer screening needs are specifically addressed at this visit.   Depression, major, single episode, severe (HCC) Start lexapro  and utilize community resources  for support Trigger/ cause is recent placement of his father in a SNF F/U in 6 to 8 weeks  GAD (generalized anxiety disorder) Uncontrolled start lexapro  and utilize community resources  Type 2 diabetes mellitus with stage 2 chronic kidney disease, with long-term current use of insulin  (HCC) Diabetes associated with hypertension, hyperlipidemia, and depression  Mr. Mcnabb is reminded of the importance of commitment to daily physical activity for 30 minutes or more, as able and the need to limit carbohydrate intake to 30 to 60 grams per meal to help with blood sugar control.   The need to take medication as prescribed, test blood sugar as directed, and to call between visits if there is a concern that blood sugar is uncontrolled is also discussed.   Mr. Barrett is reminded of the importance of daily foot exam, annual eye examination, and good blood sugar, blood pressure and cholesterol control.     Latest Ref Rng & Units 11/27/2023    1:38 PM 07/26/2023    8:47 AM 07/26/2023    8:17 AM 04/25/2023    8:23 AM 01/29/2023    8:11 AM  Diabetic Labs  HbA1c  4.0 - 5.6 % 9.2   8.5  8.0    Chol 100 - 199 mg/dL  409    811   HDL >91 mg/dL  36    36   Calc LDL 0 - 99 mg/dL  64    58   Triglycerides 0 - 149 mg/dL  66    71   Creatinine 0.76 - 1.27 mg/dL  4.78    2.95       04/05/3085    8:26 AM 11/27/2023    1:19 PM 07/27/2023    8:48 AM 07/26/2023    8:04 AM 04/25/2023    8:04 AM 04/10/2023    9:16 AM 01/17/2023    8:22 AM  BP/Weight  Systolic BP 110 110 121 112 110 120 126  Diastolic BP 69 70 71 60 76 67 80   Wt. (Lbs) 194.08 191.8 204.04 205.4 212.8 206 211  BMI 28.66 kg/m2 27.52 kg/m2 29.28 kg/m2 29.47 kg/m2 30.53 kg/m2 29.56 kg/m2 30.28 kg/m2      Latest Ref Rng & Units 06/11/2023   12:00 AM 09/29/2022    8:30 AM  Foot/eye exam completion dates  Eye Exam No Retinopathy No Retinopathy       Foot Form Completion   Done     This result is from an external source.

## 2024-02-14 NOTE — Assessment & Plan Note (Signed)
 Diabetes associated with hypertension, hyperlipidemia, and depression  Travis Herrera is reminded of the importance of commitment to daily physical activity for 30 minutes or more, as able and the need to limit carbohydrate intake to 30 to 60 grams per meal to help with blood sugar control.   The need to take medication as prescribed, test blood sugar as directed, and to call between visits if there is a concern that blood sugar is uncontrolled is also discussed.   Travis Herrera is reminded of the importance of daily foot exam, annual eye examination, and good blood sugar, blood pressure and cholesterol control.     Latest Ref Rng & Units 11/27/2023    1:38 PM 07/26/2023    8:47 AM 07/26/2023    8:17 AM 04/25/2023    8:23 AM 01/29/2023    8:11 AM  Diabetic Labs  HbA1c 4.0 - 5.6 % 9.2   8.5  8.0    Chol 100 - 199 mg/dL  161    096   HDL >04 mg/dL  36    36   Calc LDL 0 - 99 mg/dL  64    58   Triglycerides 0 - 149 mg/dL  66    71   Creatinine 0.76 - 1.27 mg/dL  5.40    9.81       1/91/4782    8:26 AM 11/27/2023    1:19 PM 07/27/2023    8:48 AM 07/26/2023    8:04 AM 04/25/2023    8:04 AM 04/10/2023    9:16 AM 01/17/2023    8:22 AM  BP/Weight  Systolic BP 110 110 121 112 110 120 126  Diastolic BP 69 70 71 60 76 67 80  Wt. (Lbs) 194.08 191.8 204.04 205.4 212.8 206 211  BMI 28.66 kg/m2 27.52 kg/m2 29.28 kg/m2 29.47 kg/m2 30.53 kg/m2 29.56 kg/m2 30.28 kg/m2      Latest Ref Rng & Units 06/11/2023   12:00 AM 09/29/2022    8:30 AM  Foot/eye exam completion dates  Eye Exam No Retinopathy No Retinopathy       Foot Form Completion   Done     This result is from an external source.

## 2024-02-14 NOTE — Assessment & Plan Note (Signed)
 Start lexapro  and utilize community resources  for support Trigger/ cause is recent placement of his father in a SNF F/U in 6 to 8 weeks

## 2024-02-18 ENCOUNTER — Other Ambulatory Visit: Payer: Self-pay

## 2024-02-18 DIAGNOSIS — E782 Mixed hyperlipidemia: Secondary | ICD-10-CM

## 2024-02-18 DIAGNOSIS — E559 Vitamin D deficiency, unspecified: Secondary | ICD-10-CM

## 2024-02-18 DIAGNOSIS — Z125 Encounter for screening for malignant neoplasm of prostate: Secondary | ICD-10-CM

## 2024-02-18 DIAGNOSIS — E1122 Type 2 diabetes mellitus with diabetic chronic kidney disease: Secondary | ICD-10-CM

## 2024-02-18 DIAGNOSIS — I10 Essential (primary) hypertension: Secondary | ICD-10-CM

## 2024-02-22 ENCOUNTER — Other Ambulatory Visit: Payer: Self-pay | Admitting: Family Medicine

## 2024-02-23 ENCOUNTER — Other Ambulatory Visit: Payer: Self-pay | Admitting: Family Medicine

## 2024-02-25 MED ORDER — TRIAMTERENE-HCTZ 37.5-25 MG PO TABS: 1.0000 | ORAL_TABLET | Freq: Every day | ORAL | 0 refills | Status: DC

## 2024-02-25 MED ORDER — FREESTYLE LIBRE 2 SENSOR MISC: 0 refills | Status: DC

## 2024-02-27 ENCOUNTER — Telehealth: Payer: Self-pay | Admitting: Nurse Practitioner

## 2024-02-27 NOTE — Telephone Encounter (Signed)
 Pt made aware that his ozempic 1mg  is here for p/u from [facility]

## 2024-02-29 NOTE — Telephone Encounter (Signed)
 Pt picked up pt assistance.

## 2024-03-05 ENCOUNTER — Ambulatory Visit: Payer: Self-pay

## 2024-03-05 NOTE — Telephone Encounter (Signed)
 Chief Complaint:  -Insect Bite- Possibly fire ants. Pt is a diabetic.   Symptoms:  -Bites noted on L. Leg, from the knee down. ( Multiple- over 20) -Redness -Severe itching: interferes with sleep   Frequency:  -Onset: two days ago   Patient denies  -Fever, swelling of the area, red streaks, bites near face.  Disposition: [ ED /[ X]Urgent Care (no appt availability in office) / [ ] Appointment(In office/virtual)/ [ ]  [facility]/ [ ] Home Care/ [ ] Refused Recommended Disposition /[ ] [facility]/ [ ]  Follow-up with PCP   Additional Notes:  Attempted to schedule patient with provider. No provider available. Patient referred to local UC. Patient agrees with plan and verbalized understanding. No additional questions/concerns noted during the time of the call.     Complete triage note below:   Copied from CRM [id number]. Topic: Clinical - Red Word Triage >> Mar 05, 2024  9:39 AM Jorie Newness J wrote: Red Word that prompted transfer to Nurse Triage: Bug bite, red swollen with fever Reason for Disposition  [1] Red or very tender (to touch) area AND [2] started over 24 hours after the sting  Answer Assessment - Initial Assessment Questions 1. TYPE of INSECT: "What type of insect was it?"      -----Thinks its the fire ants   2. ONSET: "When did you get bitten?"      ----- two days ago    3. LOCATION: "Where is the insect bite located?"      -----L. Leg - Knee down and some above    4. REDNESS: "Is the area red or pink?" If Yes, ask: "What size is area of redness?" (inches or cm). "When did the redness start?"    -- Yes....   5. PAIN: "Is there any pain?" If Yes, ask: "How bad is it?"  (Scale 1-10; or mild, moderate, severe)      ----5-6/10 pain    6. ITCHING: "Does it itch?" If Yes, ask: "How bad is the itch?"    - MILD: doesn't interfere with normal activities   - MODERATE-SEVERE: interferes with work, school, sleep, or other activities      ---------8-9/10 :  wakes him up from sleep.    7. SWELLING: "How big is the swelling?" (inches, cm, or compare to coins)    ------ Denies    8. OTHER SYMPTOMS: "Do you have any other symptoms?"  (e.g., difficulty breathing, hives)     --- Some pus    HX: Diabetic  Protocols used: Insect Bite-A-AH, Fire [employer]

## 2024-03-18 ENCOUNTER — Other Ambulatory Visit: Payer: Self-pay | Admitting: Family Medicine

## 2024-03-20 ENCOUNTER — Other Ambulatory Visit: Payer: Self-pay | Admitting: Family Medicine

## 2024-03-26 ENCOUNTER — Ambulatory Visit: Payer: PPO | Admitting: Nurse Practitioner

## 2024-03-27 ENCOUNTER — Ambulatory Visit: Admitting: Family Medicine

## 2024-04-07 ENCOUNTER — Ambulatory Visit: Payer: Self-pay

## 2024-04-07 ENCOUNTER — Encounter: Payer: Self-pay | Admitting: Nurse Practitioner

## 2024-04-07 ENCOUNTER — Other Ambulatory Visit: Payer: Self-pay

## 2024-04-07 ENCOUNTER — Ambulatory Visit: Admitting: Nurse Practitioner

## 2024-04-07 VITALS — BP 102/70 | HR 97 | Ht 70.0 in | Wt 193.0 lb

## 2024-04-07 DIAGNOSIS — N1831 Chronic kidney disease, stage 3a: Secondary | ICD-10-CM

## 2024-04-07 DIAGNOSIS — Z7985 Long-term (current) use of injectable non-insulin antidiabetic drugs: Secondary | ICD-10-CM

## 2024-04-07 DIAGNOSIS — E1122 Type 2 diabetes mellitus with diabetic chronic kidney disease: Secondary | ICD-10-CM | POA: Diagnosis not present

## 2024-04-07 DIAGNOSIS — Z794 Long term (current) use of insulin: Secondary | ICD-10-CM

## 2024-04-07 DIAGNOSIS — I10 Essential (primary) hypertension: Secondary | ICD-10-CM | POA: Diagnosis not present

## 2024-04-07 DIAGNOSIS — E782 Mixed hyperlipidemia: Secondary | ICD-10-CM | POA: Diagnosis not present

## 2024-04-07 DIAGNOSIS — Z7984 Long term (current) use of oral hypoglycemic drugs: Secondary | ICD-10-CM | POA: Diagnosis not present

## 2024-04-07 LAB — POCT GLYCOSYLATED HEMOGLOBIN (HGB A1C): Hemoglobin A1C: 8.2 % — AB (ref 4.0–5.6)

## 2024-04-07 MED ORDER — ALBUTEROL SULFATE HFA 108 (90 BASE) MCG/ACT IN AERS: 2.0000 | INHALATION_SPRAY | Freq: Four times a day (QID) | RESPIRATORY_TRACT | 0 refills | Status: DC | PRN

## 2024-04-07 MED ORDER — ALBUTEROL SULFATE HFA 108 (90 BASE) MCG/ACT IN AERS: 2.0000 | INHALATION_SPRAY | Freq: Four times a day (QID) | RESPIRATORY_TRACT | 0 refills | Status: AC | PRN

## 2024-04-07 MED ORDER — ALBUTEROL SULFATE HFA 108 (90 BASE) MCG/ACT IN AERS
2.0000 | INHALATION_SPRAY | Freq: Four times a day (QID) | RESPIRATORY_TRACT | 0 refills | Status: DC | PRN
Start: 2024-04-07 — End: 2024-04-28

## 2024-04-07 NOTE — Telephone Encounter (Signed)
 Prescription printed twice for albuterol  pls fax one and let him know

## 2024-04-07 NOTE — Progress Notes (Signed)
 04/07/2024  Endocrinology follow-up note   Subjective:    Patient ID: Travis Herrera, male    DOB: 03-24-1956,    Past Medical History:  Diagnosis Date   COVID-19 03/29/2021   Diabetes mellitus without complication (HCC) 10/16/1993   insulin  started in 2012   Hyperlipemia 10/17/2011   Hypertension 10/16/2008   Past Surgical History:  Procedure Laterality Date   CATARACT EXTRACTION Right 08/26/2021   CATARACT EXTRACTION W/PHACO  07/29/2012   Procedure: CATARACT EXTRACTION PHACO AND INTRAOCULAR LENS PLACEMENT (IOC);  Surgeon: Cherene Mania, MD;  Location: [facility];  Service: Ophthalmology;  Laterality: Left;  CDE=1.66   CHOLECYSTECTOMY  2007   [name]   EYE SURGERY Left 2013   cataract   QUADRICEPS TENDON REPAIR Right 03/19/2018   Procedure: REPAIR QUADRICEP TENDON;  Surgeon: Margrette Taft BRAVO, MD;  Location: [facility];  Service: Orthopedics;  Laterality: Right;   Social History   Socioeconomic History   Marital status: Married    Spouse name: Not on file   Number of children: 4   Years of education: Not on file   Highest education level: Not on file  Occupational History   Not on file  Tobacco Use   Smoking status: Never   Smokeless tobacco: Never  Vaping Use   Vaping status: Never Used  Substance and Sexual Activity   Alcohol use: No   Drug use: No   Sexual activity: Yes    Birth control/protection: None  Other Topics Concern   Not on file  Social History Narrative   Not on file   Social Drivers of Health   Financial Resource Strain: Low Risk  (04/10/2023)   Overall Financial Resource Strain (CARDIA)    Difficulty of Paying Living Expenses: Not hard at all  Food Insecurity: No Food Insecurity (04/10/2023)   Hunger Vital Sign    Worried About Running Out of Food in the Last Year: Never true    Ran Out of Food in the Last Year: Never true  Transportation Needs: No Transportation Needs (04/10/2023)   PRAPARE - Administrator, Civil Service  (Medical): No    Lack of Transportation (Non-Medical): No  Physical Activity: Sufficiently Active (04/10/2023)   Exercise Vital Sign    Days of Exercise per Week: 7 days    Minutes of Exercise per Session: 60 min  Stress: No Stress Concern Present (04/10/2023)   [employer] of Occupational Health - Occupational Stress Questionnaire    Feeling of Stress : Not at all  Social Connections: Socially Integrated (04/10/2023)   Social Connection and Isolation Panel    Frequency of Communication with Friends and Family: More than three times a week    Frequency of Social Gatherings with Friends and Family: More than three times a week    Attends Religious Services: More than 4 times per year    Active Member of [employer] or Organizations: Yes    Attends Banker Meetings: More than 4 times per year    Marital Status: Married   Outpatient Encounter Medications as of 04/07/2024  Medication Sig   amLODipine  (NORVASC ) 10 MG tablet Take 1 tablet (10 mg total) by mouth daily.   aspirin EC 81 MG tablet Take 81 mg by mouth daily.   benazepril  (LOTENSIN ) 20 MG tablet Take 1 tablet by mouth once daily   Blood Glucose Monitoring Suppl (ONETOUCH VERIO FLEX SYSTEM) w/Device KIT USE   TO CHECK GLUCOSE TWICE DAILY (AS  BACKUP  FOR  LIBRE)   Continuous Glucose Receiver (FREESTYLE LIBRE READER) DEVI Use to check blood sugar daily dx E11.65   Continuous Glucose Sensor (FREESTYLE LIBRE 2 SENSOR) MISC USE AS DIRECTED FOR  CONTINUOUS  GLUCOSE  MONITORING.  CHANGE  SENSOR  EVERY  14  DAYS   escitalopram  (LEXAPRO ) 10 MG tablet Take 1 tablet (10 mg total) by mouth daily.   FARXIGA  5 MG TABS tablet Take 1 tablet (5 mg total) by mouth every morning.   glucose blood (ONETOUCH VERIO) test strip USE 1 STRIP TO CHECK GLUCOSE THREE TIMES DAILY AS DIRECTED DX e11.65   insulin  degludec (TRESIBA FLEXTOUCH) 100 UNIT/ML FlexTouch Pen Inject 40 Units into the skin at bedtime. (Patient taking differently: Inject 10 Units  into the skin at bedtime.)   Insulin  Pen Needle 31G X 8 MM MISC 1 each by Does not apply route 2 (two) times daily.   Lancets (FREESTYLE) lancets Use as instructed bid   montelukast  (SINGULAIR ) 10 MG tablet Take 1 tablet (10 mg total) by mouth at bedtime.   ONETOUCH DELICA LANCETS 33G MISC Three times daily testing dx e11.65   rosuvastatin  (CRESTOR ) 10 MG tablet Take 1 tablet by mouth once daily   Semaglutide,0.25 or 0.5MG /DOS, (OZEMPIC, 0.25 OR 0.5 MG/DOSE,) 2 MG/1.5ML SOPN Inject 1 mg into the skin once a week.   triamterene -hydrochlorothiazide  (MAXZIDE-25) 37.5-25 MG tablet Take 1 tablet by mouth daily.   UNABLE TO FIND Med Name: Power scooter  DX: M17.12   vitamin C (ASCORBIC ACID) 500 MG tablet Take 500 mg by mouth daily.   No facility-administered encounter medications on file as of 04/07/2024.   ALLERGIES: Allergies  Allergen Reactions   Hydrocodone     Oxycodone     VACCINATION STATUS: Immunization History  Administered Date(s) Administered   Influenza Split 07/17/2014   Influenza, Seasonal, Injecte, Preservative Fre 08/07/2023   Influenza,inj,Quad PF,6+ Mos 08/16/2015, 07/26/2016, 08/02/2017, 07/19/2018, 06/16/2019, 07/02/2020, 11/22/2022   Influenza-Unspecified 08/02/2021   Moderna Sars-Covid-2 Vaccination 12/27/2019, 01/28/2020   PFIZER(Purple Top)SARS-COV-2 Vaccination 09/24/2020   PNEUMOCOCCAL CONJUGATE-20 10/26/2021   Pneumococcal Polysaccharide-23 09/15/2019    Diabetes He presents for his follow-up diabetic visit. He has type 2 diabetes mellitus. Onset time: He was diagnosed at approximate age of 32 years. His disease course has been improving. There are no hypoglycemic associated symptoms. Pertinent negatives for hypoglycemia include no confusion, headaches, pallor or seizures. Pertinent negatives for diabetes include no chest pain, no fatigue, no polydipsia, no polyphagia, no polyuria and no weakness. There are no hypoglycemic complications. Symptoms are stable.  Diabetic complications include nephropathy. Risk factors for coronary artery disease include diabetes mellitus, dyslipidemia, hypertension, family history, male sex, sedentary lifestyle and obesity. Current diabetic treatment includes oral agent (monotherapy) and insulin  injections (and Ozempic). He is compliant with treatment most of the time. His weight is fluctuating minimally. He is following a generally healthy diet. When asked about meal planning, he reported none. He has not had a previous visit with a dietitian. He rarely participates in exercise. His home blood glucose trend is decreasing steadily. His breakfast blood glucose range is generally 110-130 mg/dl. His bedtime blood glucose range is generally 140-180 mg/dl. His overall blood glucose range is 140-180 mg/dl. (He presents today with his CGM showing improved slightly above target glycemic profile overall.  His POCT A1c today is 8.2%, improving from last visit of 9.2%.  He notes he is nearly out of his [place], is asking if PAP is sending more soon.  Analysis of his CGM  shows TIR 52%, TAR 48%, TBR 0% with a GMI of 7.6%.) An ACE inhibitor/angiotensin II receptor blocker is being taken. He does not see a podiatrist.Eye exam is current.  Hypertension This is a chronic problem. The current episode started more than 1 year ago. The problem has been gradually improving since onset. The problem is controlled. Pertinent negatives include no chest pain, headaches, neck pain, palpitations or shortness of breath. There are no associated agents to hypertension. Risk factors for coronary artery disease include dyslipidemia, diabetes mellitus, male gender, obesity and sedentary lifestyle. Past treatments include ACE inhibitors, diuretics and calcium  channel blockers. The current treatment provides moderate improvement. There are no compliance problems.  Hypertensive end-organ damage includes kidney disease. Identifiable causes of hypertension include chronic  renal disease.  Hyperlipidemia This is a chronic problem. The current episode started more than 1 year ago. The problem is controlled. Recent lipid tests were reviewed and are normal. Exacerbating diseases include chronic renal disease, diabetes and obesity. Factors aggravating his hyperlipidemia include fatty foods. Pertinent negatives include no chest pain, myalgias or shortness of breath. Current antihyperlipidemic treatment includes statins. The current treatment provides moderate improvement of lipids. Compliance problems include adherence to diet and adherence to exercise.  Risk factors for coronary artery disease include dyslipidemia, diabetes mellitus, hypertension, male sex, a sedentary lifestyle and obesity.    Review of systems  Constitutional: + stable body weight,  current Body mass index is 27.69 kg/m. , no fatigue, no subjective hyperthermia, no subjective hypothermia Eyes: no blurry vision, no xerophthalmia ENT: no sore throat, no nodules palpated in throat, no dysphagia/odynophagia, no hoarseness Cardiovascular: no chest pain, no shortness of breath, no palpitations, no leg swelling Respiratory: + cough, no shortness of breath, + wheezing- worse at night Gastrointestinal: no nausea/vomiting/diarrhea Musculoskeletal: no muscle/joint aches Skin: no rashes, no hyperemia Neurological: no tremors, no numbness, no tingling, no dizziness Psychiatric: no depression, no anxiety    Objective:    BP 102/70 (BP Location: Left Arm, Patient Position: Sitting, Cuff Size: Large)   Pulse 97   Ht 5' 10 (1.778 m)   Wt 193 lb (87.5 kg)   BMI 27.69 kg/m   Wt Readings from Last 3 Encounters:  04/07/24 193 lb (87.5 kg)  02/12/24 194 lb 1.3 oz (88 kg)  11/27/23 191 lb 12.8 oz (87 kg)    BP Readings from Last 3 Encounters:  04/07/24 102/70  02/12/24 110/69  11/27/23 110/70     Physical Exam- Limited  Constitutional:  Body mass index is 27.69 kg/m. , not in acute distress, normal  state of mind Eyes:  EOMI, no exophthalmos Musculoskeletal: no gross deformities, strength intact in all four extremities, no gross restriction of joint movements Skin:  no rashes, no hyperemia Neurological: no tremor with outstretched hands    Diabetic Foot Exam - Simple   No data filed     Results for orders placed or performed in visit on 04/07/24  HgB A1c   Collection Time: 04/07/24  8:14 AM  Result Value Ref Range   Hemoglobin A1C 8.2 (A) 4.0 - 5.6 %   HbA1c POC (<> result, manual entry)     HbA1c, POC (prediabetic range)     HbA1c, POC (controlled diabetic range)     Diabetic Labs (most recent): Lab Results  Component Value Date   HGBA1C 8.2 (A) 04/07/2024   HGBA1C 9.2 (A) 11/27/2023   HGBA1C 8.5 (A) 07/26/2023   MICROALBUR 1.0 10/31/2019   MICROALBUR 1.3 12/06/2018   MICROALBUR  1.5 10/24/2016   Lipid Panel     Component Value Date/Time   CHOL 114 07/26/2023 0847   TRIG 66 07/26/2023 0847   HDL 36 (L) 07/26/2023 0847   CHOLHDL 3.2 07/26/2023 0847   CHOLHDL 3.2 07/13/2020 0840   VLDL 12 01/16/2017 0940   LDLCALC 64 07/26/2023 0847   LDLCALC 36 07/13/2020 0840      Assessment & Plan:   1) Diabetes mellitus type 2 without complications, with long term insulin  use (HCC)  He presents today with his CGM showing improved slightly above target glycemic profile overall.  His POCT A1c today is 8.2%, improving from last visit of 9.2%.  He notes he is nearly out of his [place], is asking if PAP is sending more soon.  Analysis of his CGM shows TIR 52%, TAR 48%, TBR 0% with a GMI of 7.6%.    Recent labs reviewed, showing mild renal insufficiency (stable).  - Patient remains at a high risk for more acute and chronic complications of diabetes which include CAD, CVA, CKD, retinopathy, and neuropathy. These are all discussed in detail with the patient.  - Nutritional counseling repeated at each appointment due to patients tendency to fall back in to old habits.  - The  patient admits there is a room for improvement in their diet and drink choices. -  Suggestion is made for the patient to avoid simple carbohydrates from their diet including Cakes, Sweet Desserts / Pastries, Ice Cream, Soda (diet and regular), Sweet Tea, Candies, Chips, Cookies, Sweet Pastries, Store Bought Juices, Alcohol in Excess of 1-2 drinks a day, Artificial Sweeteners, Coffee Creamer, and Sugar-free Products. This will help patient to have stable blood glucose profile and potentially avoid unintended weight gain.   - I encouraged the patient to switch to unprocessed or minimally processed complex starch and increased protein intake (animal or plant source), fruits, and vegetables.   - Patient is advised to stick to a routine mealtimes to eat 3 meals a day and avoid unnecessary snacks (to snack only to correct hypoglycemia).  - I have approached patient with the following individualized plan to manage diabetes and patient agrees.  He is advised to continue Tresiba 10 units SQ nightly and continue Ozempic 1 mg SQ weekly (gets these from patient assistance).  He is advised to continue his Farxiga  2.5 mg po daily (at advice of his nephrologist).  -He is advised to continue monitoring blood glucose at least twice daily using his CGM, before breakfast and before bed, and call the clinic if he gets readings less than 70 or greater than 200 for 3 tests in a row.  - Patient specific target  for A1c; LDL, HDL, Triglycerides, and  Waist Circumference were discussed in detail.  2) BP/HTN: His blood pressure is controlled to target.  He is advised to continue current medications as prescribed by PCP/nephrology.  3) Lipids/HPL: Most recent lipid panel from 07/26/23 shows controlled LDL at 64.  He is advised to continue Crestor  10 mg po daily at bedtime.  Side effects and precautions discussed with him.  He is advised to avoid fried foods and butter.  He has labs upcoming with PCP.   4)  Weight/Diet:   His Body mass index is 27.69 kg/m.-is a candidate for modest weight loss.  CDE consult in progress, exercise, and carbohydrates information provided.  5) Chronic Care/Health Maintenance: -Patient on ACEI and Statin medications and encouraged to continue to follow up with Ophthalmology, Podiatrist at least yearly or according  to recommendations, and advised to stay away from smoking. I have recommended yearly flu vaccine and pneumonia vaccination at least every 5 years; moderate intensity exercise for up to 150 minutes weekly; and  sleep for at least 7 hours a day.  I advised patient to maintain close follow up with his PCP for primary care needs.  I did advise him to reach out to his PCP regarding his cough/wheezing.      I spent  31  minutes in the care of the patient today including review of labs from CMP, Lipids, Thyroid  Function, Hematology (current and previous including abstractions from other facilities); face-to-face time discussing  his blood glucose readings/logs, discussing hypoglycemia and hyperglycemia episodes and symptoms, medications doses, his options of short and long term treatment based on the latest standards of care / guidelines;  discussion about incorporating lifestyle medicine;  and documenting the encounter. Risk reduction counseling performed per USPSTF guidelines to reduce obesity and cardiovascular risk factors.     Please refer to Patient Instructions for Blood Glucose Monitoring and Insulin /Medications Dosing Guide  in media tab for additional information. Please  also refer to  Patient Self Inventory in the Media  tab for reviewed elements of pertinent patient history.  Travis Herrera participated in the discussions, expressed understanding, and voiced agreement with the above plans.  All questions were answered to his satisfaction. he is encouraged to contact clinic should he have any questions or concerns prior to his return visit.    Follow up  plan: Return in about 4 months (around 08/07/2024) for Diabetes F/U with A1c in office, No previsit labs, Bring meter and logs.   [name] Rio, [facility] [address] [place], [place] [address] Phone: [phone] Fax: [phone]  04/07/2024, 8:25 AM

## 2024-04-07 NOTE — Addendum Note (Signed)
 Addended by: ANTONETTA ROLLENE BRAVO on: 04/07/2024 10:33 AM   Modules accepted: Orders

## 2024-04-07 NOTE — Telephone Encounter (Signed)
 FYI Only or Action Required?: Action required by provider: medication refill request.  Patient was last seen in primary care on 02/12/2024 by Antonetta Rollene BRAVO, MD. Called Nurse Triage reporting Asthma. Symptoms began yesterday. Interventions attempted: Prescription medications: Singular. Symptoms are: unchanged.  Triage Disposition: Home Care  Patient/caregiver understands and will follow disposition?: Yes  Copied from CRM [id number]. Topic: Clinical - Red Word Triage >> Apr 07, 2024  9:35 AM Tobias L wrote: Red Word that prompted transfer to Nurse Triage: coughing, wheezing, congestion - asthma flare up Reason for Disposition  MILD asthma attack (e.g., no SOB at rest, mild SOB with walking, speaks normally in sentences, mild wheezing)  Answer Assessment - Initial Assessment Questions Pt reports increase asthma s/s the past 2 couple. Upon assessment, pt is stable and requesting MD if she could prescribe the albuterol inhaler she had previously had him on once before and it helped pt a lot.  Nurse advised if s/s get worse, chest tihgtness/pain, difficult to breathing to seek UC/ED. PT verbalizes understanding and is kindly requesting if MD can prescribe the Albuterol inhaler. Albuterol inhaler not on current med list.   1. RESPIRATORY STATUS: Describe your breathing? (e.g., wheezing, shortness of breath, unable to speak, severe coughing)      Since last couple nights have had increase wheezing, coughing. Last night to this morning is having rattle in chest.  2. ONSET: When did this asthma attack begin?      Last couple nights, but mainly last night 3. TRIGGER: What do you think triggered this attack? (e.g., URI, exposure to pollen or other allergen, tobacco smoke)      Asthma - seasonal changes  4. PEAK EXPIRATORY FLOW RATE (PEFR): Do you use a peak flow meter? If Yes, ask: What's the current peak flow? What's your personal best peak flow?     N/a 5. SEVERITY: How bad is this  attack?    - MILD: No SOB at rest, mild SOB with walking, speaks normally in sentences, can lie down, no retractions, pulse < 100. (GREEN Zone: PEFR 80-100%)   - MODERATE: SOB at rest, SOB with minimal exertion and prefers to sit, cannot lie down flat, speaks in phrases, mild retractions, audible wheezing, pulse 100-120. (YELLOW Zone: PEFR 50-79%)    - SEVERE: Struggling for each breath, speaks in single words, struggling to breathe, sitting hunched forward, retractions, usually loud wheezing, sometimes minimal wheezing because of decreased air movement, pulse > 120. (RED Zone: PEFR < 50%).      Mild, pt able to catch breath 6. ASTHMA MEDICINES:  What treatments have you tried?    - INHALED QUICK RELIEF (RESCUE): What is your inhaled quick-relief medicine? (e.g., albuterol, salbutamol) Do you use an inhaler or a nebulizer? How frequently have you been using this medicine?   - CONTROLLER (LONG-TERM-CONTROL): Do you take an inhaled steroid? (e.g., Asmanex, Flovent, Pulmicort, Qvar)     // 7. INHALED QUICK-RELIEF TREATMENTS FOR THIS ATTACK: What treatments have you given yourself so far? and How many and how often? If using an inhaler, ask, How many puffs? Note: Routine treatments are 2 puffs every 4 hours as needed. Rescue treatments are 4 puffs repeated every 20 minutes, up to three times as needed.     singular 8. OTHER SYMPTOMS: Do you have any other symptoms? (e.g., chest pain, coughing up yellow sputum, fever, runny nose)     Reports wheezing, dry cough, some rattle in chest, no mucous, no fevers, no runny nose, no  difficulty breathing.  9. O2 SATURATION MONITOR:  Do you use an oxygen saturation monitor (pulse oximeter) at home? If Yes, What is your reading (oxygen level) today? What is your usual oxygen saturation reading? (e.g., 95%)     no  Protocols used: Asthma Attack-A-AH

## 2024-04-08 ENCOUNTER — Other Ambulatory Visit: Payer: Self-pay | Admitting: Family Medicine

## 2024-04-15 ENCOUNTER — Ambulatory Visit (INDEPENDENT_AMBULATORY_CARE_PROVIDER_SITE_OTHER): Payer: PPO

## 2024-04-15 VITALS — Ht 70.0 in | Wt 190.0 lb

## 2024-04-15 DIAGNOSIS — Z794 Long term (current) use of insulin: Secondary | ICD-10-CM | POA: Diagnosis not present

## 2024-04-15 DIAGNOSIS — Z0000 Encounter for general adult medical examination without abnormal findings: Secondary | ICD-10-CM

## 2024-04-15 DIAGNOSIS — E1122 Type 2 diabetes mellitus with diabetic chronic kidney disease: Secondary | ICD-10-CM

## 2024-04-15 DIAGNOSIS — N1831 Chronic kidney disease, stage 3a: Secondary | ICD-10-CM | POA: Diagnosis not present

## 2024-04-15 NOTE — Patient Instructions (Signed)
 [name] ,  Thank you for taking time out of your busy schedule to complete your Annual Wellness Visit with me. I enjoyed our conversation and look forward to speaking with you again next year. I, as well as your care team,  appreciate your ongoing commitment to your health goals. Please review the following plan we discussed and let me know if I can assist you in the future.  I enjoyed our conversation and look forward to it again next year. Blessing for the upcoming year!!  -Shirley Decamp  Your Game plan/ To Do List    Referrals/Orders Placed:  Lab Orders         Microalbumin / creatinine urine ratio    Have this completed at [facility]. No appointment needed.   Follow up Visits:  Next appointment with PCP:  April 24, 2024 Medicare AWV with health advisor:  April 20, 2025 at 8:00 video visit    Clinician Recommendations:  Aim for 30 minutes of exercise or brisk walking, 6-8 glasses of water, and 5 servings of fruits and vegetables each day.       This is a list of the screening recommended for you and due dates:  Health Maintenance  Topic Date Due   COVID-19 Vaccine (4 - 2024-25 season) 06/17/2023   Zoster (Shingles) Vaccine (1 of 2) 05/13/2024*   DTaP/Tdap/Td vaccine (1 - Tdap) 07/28/2024*   Yearly kidney health urinalysis for diabetes  08/07/2024*   Flu Shot  05/16/2024   Eye exam for diabetics  06/10/2024   Yearly kidney function blood test for diabetes  07/25/2024   Hemoglobin A1C  10/07/2024   Complete foot exam   02/13/2025   Medicare Annual Wellness Visit  04/15/2025   Cologuard (Stool DNA test)  05/03/2025   Pneumococcal Vaccine for age over 62  Completed   Hepatitis C Screening  Completed   HIV Screening  Completed   Hepatitis B Vaccine  Aged Out   HPV Vaccine  Aged Out   Meningitis B Vaccine  Aged Out   Colon Cancer Screening  Discontinued  *Topic was postponed. The date shown is not the original due date.    Advanced directives: (Provided) Advance  directive discussed with you today. I have provided a copy for you to complete at home and have notarized. Once this is complete, please bring a copy in to our office so we can scan it into your chart.  Advance Care Planning is important because it:  [x]  Makes sure you receive the medical care that is consistent with your values, goals, and preferences  [x]  It provides guidance to your family and loved ones and reduces their decisional burden about whether or not they are making the right decisions based on your wishes.  Follow the link provided in your after visit summary or read over the paperwork we have mailed to you to help you started getting your Advance Directives in place. If you need assistance in completing these, please reach out to us  so that we can help you!

## 2024-04-15 NOTE — Progress Notes (Signed)
 Subjective:   Travis Herrera is a 68 y.o. who presents for a Medicare Wellness preventive visit.  As a reminder, Annual Wellness Visits don't include a physical exam, and some assessments may be limited, especially if this visit is performed virtually. We may recommend an in-person follow-up visit with your provider if needed.  Visit Complete: Virtual I connected with  [name] on 04/15/24 by a audio enabled telemedicine application and verified that I am speaking with the correct person using two identifiers.  Patient Location: Home  Provider Location: Home Office  I discussed the limitations of evaluation and management by telemedicine. The patient expressed understanding and agreed to proceed.  Vital Signs: Because this visit was a virtual/telehealth visit, some criteria may be missing or patient reported. Any vitals not documented were not able to be obtained and vitals that have been documented are patient reported.  VideoDeclined- This patient declined Librarian, academic. Therefore the visit was completed with audio only.  Persons Participating in Visit: Patient.  AWV Questionnaire: No: Patient Medicare AWV questionnaire was not completed prior to this visit.  Cardiac Risk Factors include: advanced age (>49men, >51 women);diabetes mellitus;dyslipidemia;hypertension;male gender     Objective:    Today's Vitals   04/15/24 0827  Weight: 190 lb (86.2 kg)  Height: 5' 10 (1.778 m)   Body mass index is 27.26 kg/m.     04/15/2024    8:28 AM 04/10/2023    9:24 AM 01/30/2022   11:06 AM 01/12/2022    9:32 AM 08/16/2021    2:11 PM 07/26/2020    1:21 PM 06/24/2018    8:35 AM  Advanced Directives  Does Patient Have a Medical Advance Directive? No No No No No;Yes No No   Type of Advance Directive     Healthcare Power of Attorney    Does patient want to make changes to medical advance directive?     No - Patient declined    Copy of Healthcare  Power of Attorney in Chart?     No - copy requested    Would patient like information on creating a medical advance directive? Yes (MAU/Ambulatory/Procedural Areas - Information given) No - Patient declined No - Patient declined No - Patient declined No - Patient declined No - Patient declined Yes (ED - Information included in AVS)      Data saved with a previous flowsheet row definition    Current Medications (verified) Outpatient Encounter Medications as of 04/15/2024  Medication Sig   albuterol  (VENTOLIN  HFA) 108 (90 Base) MCG/ACT inhaler Inhale 2 puffs into the lungs every 6 (six) hours as needed for wheezing or shortness of breath.   albuterol  (VENTOLIN  HFA) 108 (90 Base) MCG/ACT inhaler Inhale 2 puffs into the lungs every 6 (six) hours as needed for wheezing or shortness of breath.   amLODipine  (NORVASC ) 10 MG tablet Take 1 tablet (10 mg total) by mouth daily.   aspirin EC 81 MG tablet Take 81 mg by mouth daily.   benazepril  (LOTENSIN ) 20 MG tablet Take 1 tablet by mouth once daily   Blood Glucose Monitoring Suppl (ONETOUCH VERIO FLEX SYSTEM) w/Device KIT USE   TO CHECK GLUCOSE TWICE DAILY (AS  BACKUP  FOR  LIBRE)   Continuous Glucose Receiver (FREESTYLE LIBRE READER) DEVI Use to check blood sugar daily dx E11.65   Continuous Glucose Sensor (FREESTYLE LIBRE 2 SENSOR) MISC USE AS DIRECTED FOR  CONTINUOUS  GLUCOSE  MONITORING.  CHANGE  SENSOR  EVERY  14  DAYS   escitalopram  (LEXAPRO ) 10 MG tablet Take 1 tablet (10 mg total) by mouth daily.   FARXIGA  5 MG TABS tablet Take 1 tablet (5 mg total) by mouth every morning.   glucose blood (ONETOUCH VERIO) test strip USE 1 STRIP TO CHECK GLUCOSE THREE TIMES DAILY AS DIRECTED DX e11.65   insulin  degludec (TRESIBA FLEXTOUCH) 100 UNIT/ML FlexTouch Pen Inject 40 Units into the skin at bedtime. (Patient taking differently: Inject 10 Units into the skin at bedtime.)   Insulin  Pen Needle 31G X 8 MM MISC 1 each by Does not apply route 2 (two) times daily.    Lancets (FREESTYLE) lancets Use as instructed bid   montelukast  (SINGULAIR ) 10 MG tablet Take 1 tablet (10 mg total) by mouth at bedtime.   ONETOUCH DELICA LANCETS 33G MISC Three times daily testing dx e11.65   rosuvastatin  (CRESTOR ) 10 MG tablet Take 1 tablet by mouth once daily   Semaglutide,0.25 or 0.5MG /DOS, (OZEMPIC, 0.25 OR 0.5 MG/DOSE,) 2 MG/1.5ML SOPN Inject 1 mg into the skin once a week.   triamterene -hydrochlorothiazide  (MAXZIDE-25) 37.5-25 MG tablet Take 1 tablet by mouth daily.   UNABLE TO FIND Med Name: Power scooter  DX: M17.12   vitamin C (ASCORBIC ACID) 500 MG tablet Take 500 mg by mouth daily.   No facility-administered encounter medications on file as of 04/15/2024.    Allergies (verified) Hydrocodone  and Oxycodone    History: Past Medical History:  Diagnosis Date   COVID-19 03/29/2021   Diabetes mellitus without complication (HCC) 10/16/1993   insulin  started in 2012   Hyperlipemia 10/17/2011   Hypertension 10/16/2008   Past Surgical History:  Procedure Laterality Date   CATARACT EXTRACTION Right 08/26/2021   CATARACT EXTRACTION W/PHACO  07/29/2012   Procedure: CATARACT EXTRACTION PHACO AND INTRAOCULAR LENS PLACEMENT (IOC);  Surgeon: Cherene Mania, MD;  Location: [facility];  Service: Ophthalmology;  Laterality: Left;  CDE=1.66   CHOLECYSTECTOMY  2007   Travis Herrera   EYE SURGERY Left 2013   cataract   QUADRICEPS TENDON REPAIR Right 03/19/2018   Procedure: REPAIR QUADRICEP TENDON;  Surgeon: Margrette Taft BRAVO, MD;  Location: [facility];  Service: Orthopedics;  Laterality: Right;   Family History  Problem Relation Age of Onset   Cancer Sister    Early death Father        car accident    Cancer Brother    Hypertension Brother    Diabetes Brother    Hypertension Brother    Social History   Socioeconomic History   Marital status: Married    Spouse name: Not on file   Number of children: 4   Years of education: Not on file   Highest education level: Not on file   Occupational History   Not on file  Tobacco Use   Smoking status: Never   Smokeless tobacco: Never  Vaping Use   Vaping status: Never Used  Substance and Sexual Activity   Alcohol use: No   Drug use: No   Sexual activity: Yes    Birth control/protection: None  Other Topics Concern   Not on file  Social History Narrative   Not on file   Social Drivers of Health   Financial Resource Strain: Low Risk  (04/15/2024)   Overall Financial Resource Strain (CARDIA)    Difficulty of Paying Living Expenses: Not hard at all  Food Insecurity: No Food Insecurity (04/15/2024)   Hunger Vital Sign    Worried About Running Out of Food in the Last  Year: Never true    Ran Out of Food in the Last Year: Never true  Transportation Needs: No Transportation Needs (04/15/2024)   PRAPARE - Administrator, Civil Service (Medical): No    Lack of Transportation (Non-Medical): No  Physical Activity: Sufficiently Active (04/15/2024)   Exercise Vital Sign    Days of Exercise per Week: 7 days    Minutes of Exercise per Session: 60 min  Stress: No Stress Concern Present (04/15/2024)   [employer] of Occupational Health - Occupational Stress Questionnaire    Feeling of Stress: Not at all  Social Connections: Socially Integrated (04/15/2024)   Social Connection and Isolation Panel    Frequency of Communication with Friends and Family: More than three times a week    Frequency of Social Gatherings with Friends and Family: More than three times a week    Attends Religious Services: More than 4 times per year    Active Member of [employer] or Organizations: Yes    Attends Engineer, structural: More than 4 times per year    Marital Status: Married    Tobacco Counseling Counseling given: Yes    Clinical Intake:  Pre-visit preparation completed: Yes  Pain : No/denies pain     BMI - recorded: 27.26 Nutritional Status: BMI 25 -29 Overweight Nutritional Risks: None Diabetes: Yes CBG  done?: No Did pt. bring in CBG monitor from home?: No  Lab Results  Component Value Date   HGBA1C 8.2 (A) 04/07/2024   HGBA1C 9.2 (A) 11/27/2023   HGBA1C 8.5 (A) 07/26/2023     How often do you need to have someone help you when you read instructions, pamphlets, or other written materials from your doctor or pharmacy?: 1 - Never  Interpreter Needed?: No  Information entered by :: Stefano ORN CMA   Activities of Daily Living     04/15/2024    8:29 AM  In your present state of health, do you have any difficulty performing the following activities:  Hearing? 0  Vision? 0  Difficulty concentrating or making decisions? 0  Walking or climbing stairs? 0  Dressing or bathing? 0  Doing errands, shopping? 0  Preparing Food and eating ? N  Using the Toilet? N  In the past six months, have you accidently leaked urine? N  Do you have problems with loss of bowel control? N  Managing your Medications? N  Managing your Finances? N  Housekeeping or managing your Housekeeping? N    Patient Care Team: Antonetta Rollene BRAVO, MD as PCP - General (Family Medicine) Dr [facility], OD as Consulting Physician (Optometry) Therisa [name] PARAS, NP as Nurse Practitioner (Endocrinology)  I have updated your Care Teams any recent Medical Services you may have received from other providers in the past year.     Assessment:   This is a routine wellness examination for Britian.  Hearing/Vision screen Hearing Screening - Comments:: Patient denies any hearing difficulties.   Vision Screening - Comments:: Wears rx glasses - up to date with routine eye exams with  [facility]   Goals Addressed             This Visit's Progress    Patient Stated   On track    Going to the gym.     Patient Stated       Improve my health and get my A1C down       Depression Screen     04/15/2024  8:45 AM 02/12/2024    8:39 AM 02/12/2024    8:28 AM 04/10/2023    9:22 AM 11/22/2022     8:28 AM 04/25/2022    8:08 AM 01/31/2022    8:06 AM  PHQ 2/9 Scores  PHQ - 2 Score 0 3 1 0 0 0 0  PHQ- 9 Score 0 16         Fall Risk     04/15/2024    8:36 AM 02/12/2024    8:28 AM 04/10/2023    9:25 AM 11/22/2022    8:28 AM 04/25/2022    8:08 AM  Fall Risk   Falls in the past year? 0 0 0 0 0  Number falls in past yr: 0 0 0 0 0  Injury with Fall? 0 0 0 0 0  Risk for fall due to : No Fall Risks  Impaired balance/gait;Impaired mobility;Orthopedic patient No Fall Risks No Fall Risks  Follow up Falls evaluation completed;Education provided;Falls prevention discussed Falls evaluation completed Falls prevention discussed Falls evaluation completed Falls evaluation completed      Data saved with a previous flowsheet row definition    MEDICARE RISK AT HOME:  Medicare Risk at Home Any stairs in or around the home?: Yes If so, are there any without handrails?: No Home free of loose throw rugs in walkways, pet beds, electrical cords, etc?: Yes Adequate lighting in your home to reduce risk of falls?: Yes Life alert?: No Use of a cane, walker or w/c?: No Grab bars in the bathroom?: No Shower chair or bench in shower?: No Elevated toilet seat or a handicapped toilet?: No  TIMED UP AND GO:  Was the test performed?  No  Cognitive Function: 6CIT completed    08/16/2021    2:13 PM  MMSE - Mini Mental State Exam  Not completed: Unable to complete        04/15/2024    8:36 AM 04/10/2023    9:25 AM 08/16/2021    2:13 PM 07/26/2020    1:24 PM 06/26/2019    8:15 AM  6CIT Screen  What Year? 0 points 0 points 0 points 0 points 0 points  What month? 0 points 0 points 0 points 0 points 0 points  What time? 0 points 0 points 0 points 0 points 0 points  Count back from 20 0 points 0 points 0 points 0 points 0 points  Months in reverse 0 points 0 points 2 points 0 points 0 points  Repeat phrase 0 points 0 points 0 points 0 points 0 points  Total Score 0 points 0 points 2 points 0 points 0 points     Immunizations Immunization History  Administered Date(s) Administered   Influenza Split 07/17/2014   Influenza, Seasonal, Injecte, Preservative Fre 08/07/2023   Influenza,inj,Quad PF,6+ Mos 08/16/2015, 07/26/2016, 08/02/2017, 07/19/2018, 06/16/2019, 07/02/2020, 11/22/2022   Influenza-Unspecified 08/02/2021   Moderna Sars-Covid-2 Vaccination 12/27/2019, 01/28/2020   PFIZER(Purple Top)SARS-COV-2 Vaccination 09/24/2020   PNEUMOCOCCAL CONJUGATE-20 10/26/2021   Pneumococcal Polysaccharide-23 09/15/2019    Screening Tests Health Maintenance  Topic Date Due   COVID-19 Vaccine (4 - 2024-25 season) 06/17/2023   Zoster Vaccines- Shingrix (1 of 2) 05/13/2024 (Originally 09/17/1975)   DTaP/Tdap/Td (1 - Tdap) 07/28/2024 (Originally 09/17/1975)   Diabetic kidney evaluation - Urine ACR  08/07/2024 (Originally 01/17/2022)   INFLUENZA VACCINE  05/16/2024   OPHTHALMOLOGY EXAM  06/10/2024   Diabetic kidney evaluation - eGFR measurement  07/25/2024   HEMOGLOBIN A1C  10/07/2024   FOOT EXAM  02/13/2025   Medicare Annual Wellness (AWV)  04/15/2025   Fecal DNA (Cologuard)  05/03/2025   Pneumococcal Vaccine: 50+ Years  Completed   Hepatitis C Screening  Completed   HIV Screening  Completed   Hepatitis B Vaccines  Aged Out   HPV VACCINES  Aged Out   Meningococcal B Vaccine  Aged Out   Colonoscopy  Discontinued    Health Maintenance  Health Maintenance Due  Topic Date Due   COVID-19 Vaccine (4 - 2024-25 season) 06/17/2023   Health Maintenance Items Addressed: Labs Ordered: Urine ACR  Additional Screening:  Vision Screening: Recommended annual ophthalmology exams for early detection of glaucoma and other disorders of the eye. Would you like a referral to an eye doctor? No    Dental Screening: Recommended annual dental exams for proper oral hygiene  Community Resource Referral / Chronic Care Management: CRR required this visit?  No   CCM required this visit?  No   Plan:    I have  personally reviewed and noted the following in the patient's chart:   Medical and social history Use of alcohol, tobacco or illicit drugs  Current medications and supplements including opioid prescriptions. Patient is not currently taking opioid prescriptions. Functional ability and status Nutritional status Physical activity Advanced directives List of other physicians Hospitalizations, surgeries, and ER visits in previous 12 months Vitals Screenings to include cognitive, depression, and falls Referrals and appointments  In addition, I have reviewed and discussed with patient certain preventive protocols, quality metrics, and best practice recommendations. A written personalized care plan for preventive services as well as general preventive health recommendations were provided to patient.   Sephora Boyar, CMA   04/15/2024   After Visit Summary: (MyChart) Due to this being a telephonic visit, the after visit summary with patients personalized plan was offered to patient via MyChart   Notes: Nothing significant to report at this time.

## 2024-04-17 ENCOUNTER — Other Ambulatory Visit: Payer: Self-pay

## 2024-04-17 DIAGNOSIS — N1831 Chronic kidney disease, stage 3a: Secondary | ICD-10-CM

## 2024-04-17 MED ORDER — FARXIGA 5 MG PO TABS: ORAL_TABLET | ORAL | 3 refills | Status: AC

## 2024-04-17 MED ORDER — FARXIGA 5 MG PO TABS: 5.0000 mg | ORAL_TABLET | Freq: Every morning | ORAL | 3 refills | Status: DC

## 2024-04-18 ENCOUNTER — Other Ambulatory Visit: Payer: Self-pay | Admitting: Family Medicine

## 2024-04-22 ENCOUNTER — Telehealth: Payer: Self-pay | Admitting: *Deleted

## 2024-04-22 NOTE — Telephone Encounter (Signed)
 Travis Herrera  left a message that he was to call [name] if he had not received his [place]. He has not rec'd it.

## 2024-04-23 ENCOUNTER — Telehealth: Payer: Self-pay | Admitting: *Deleted

## 2024-04-23 NOTE — Telephone Encounter (Signed)
 Per [employer] the patient will need to update his application. Patient has been called and made aware, he will come by office today and get the application, complete it and bring it back to us .

## 2024-04-23 NOTE — Telephone Encounter (Signed)
 Patient was called and made aware that his application with [employer] ( Patient ID - [id number])  had expired as of April 20, 2024. Per [employer] the patient will have to update his application and provide all the supporting information. Once itis faxed , takes 2 days for them  to receive and process , then they ask for another 10 -14 days to get order ready. He plans to come by later this afternoon and get the application. Our part will be completed and ready to fax when he brings his part in.

## 2024-04-23 NOTE — Telephone Encounter (Signed)
 He gets this from patient assistance, along with his Ozempic.  Can you call [employer] and see if his shipment will be coming soon?

## 2024-04-24 ENCOUNTER — Ambulatory Visit: Admitting: Family Medicine

## 2024-04-26 ENCOUNTER — Other Ambulatory Visit: Payer: Self-pay | Admitting: Family Medicine

## 2024-05-15 ENCOUNTER — Other Ambulatory Visit: Payer: Self-pay | Admitting: Family Medicine

## 2024-05-15 NOTE — Telephone Encounter (Signed)
 Patient checking the status - I told him to give it another week

## 2024-05-21 ENCOUNTER — Other Ambulatory Visit: Payer: Self-pay | Admitting: Family Medicine

## 2024-05-28 NOTE — Telephone Encounter (Signed)
 Patient is calling to check to see if you can reach out about his ozempic

## 2024-05-29 ENCOUNTER — Ambulatory Visit: Admission: EM | Admit: 2024-05-29 | Discharge: 2024-05-29 | Disposition: A | Discharge status: Home / Self Care

## 2024-05-29 NOTE — Telephone Encounter (Signed)
 Patient made aware.

## 2024-05-29 NOTE — ED Triage Notes (Signed)
 Pt reports sore throat, cough and congestion x 2 days, pt has tried warm salt water but has found no relief, using inhaler helps with cough. Symptoms worsen at night.

## 2024-05-29 NOTE — ED Notes (Signed)
 Pt's spouse came out inquiring  how much longer the wait would be was informed that the provider would be seeing them next. Wife then states that  patient had become restless and was ready to leave was informed that was  up to them. Pt steps out and asks  was there someone else in front of me or something? Informed there was a patient ahead of him and he walked out saying  the lady up front didn't tell me there ws someone in front of me. I'm not waiting I'm going to leave

## 2024-05-29 NOTE — Telephone Encounter (Signed)
 I have called [employer]. Patient's Ozempic, Tresiba and needles are in the process of being shipped, they ask to call back in 1-2 days for tracking number, ect.

## 2024-05-30 NOTE — Telephone Encounter (Signed)
 Pt's tresiba and 2 boxes of pen needles were delviered. Pt will pick up monday

## 2024-06-02 NOTE — Telephone Encounter (Signed)
 Patient picked up.

## 2024-06-03 NOTE — Telephone Encounter (Signed)
 Pt picked up pt assistance.

## 2024-06-03 NOTE — Telephone Encounter (Signed)
 Called pt, to let him know his ozempic is here, he will come today

## 2024-06-09 DIAGNOSIS — H524 Presbyopia: Secondary | ICD-10-CM | POA: Diagnosis not present

## 2024-06-09 LAB — HM DIABETES EYE EXAM

## 2024-06-10 ENCOUNTER — Other Ambulatory Visit: Payer: Self-pay | Admitting: Family Medicine

## 2024-06-19 ENCOUNTER — Other Ambulatory Visit: Payer: Self-pay | Admitting: Family Medicine

## 2024-07-03 ENCOUNTER — Ambulatory Visit: Payer: Self-pay

## 2024-07-03 ENCOUNTER — Other Ambulatory Visit: Payer: Self-pay | Admitting: Family Medicine

## 2024-07-03 DIAGNOSIS — U071 COVID-19: Secondary | ICD-10-CM

## 2024-07-03 MED ORDER — NIRMATRELVIR/RITONAVIR (PAXLOVID) TABLET (RENAL DOSING)
2.0000 | ORAL_TABLET | Freq: Two times a day (BID) | ORAL | 0 refills | Status: AC
Start: 2024-07-03 — End: 2024-07-08

## 2024-07-03 NOTE — Telephone Encounter (Signed)
 FYI Only or Action Required?: Action required by provider: medication refill request. Would like antiviral sent in to  pharm.   Patient was last seen in primary care on 02/12/2024 by Antonetta Rollene BRAVO, MD.  Called Nurse Triage reporting Covid Positive.  Symptoms began yesterday.  Interventions attempted: OTC medications: alk-seltzer.  Symptoms are: gradually worsening.  Triage Disposition: Call PCP Within 24 Hours  Patient/caregiver understands and will follow disposition?: No, wishes to speak with PCP  Message from [facility] sent at 07/03/2024 12:55 PM EDT  Patients wife is calling for the patient: Travis Herrera [phone]  She states that the patient is throwing up, chills, and positive for Covid, and fever at 104 in temple and 102 on the arm.  Would like to know if something can be prescribed to assist the patient in feeling better   Reason for Disposition  [1] HIGH RISK patient (e.g., weak immune system, 65 years and older, obesity with BMI 30 or higher, pregnant, chronic lung disease) AND [2] COVID symptoms (e.g., cough, fever)  (Exceptions: Already seen by PCP and no new or worsening symptoms.)  Answer Assessment - Initial Assessment Questions 1. SYMPTOMS: What is your main symptom or concern? (e.g., cough, fever, shortness of breath, muscle aches)     Fever, chills, vomiting,  2. ONSET: When did the symptoms start?      2 days 3. COUGH: Do you have a cough? If Yes, ask: How bad is the cough?       mild 4. FEVER: Do you have a fever? If Yes, ask: What is your temperature, how was it measured, and when did it start?     102 ax,  5. BREATHING DIFFICULTY: Are you having any difficulty breathing? (e.g., normal; shortness of breath, wheezing, unable to speak)      denies 6. BETTER-SAME-WORSE: Are you getting better, staying the same or getting worse compared to yesterday?  If getting worse, ask, In what way?     worse 7. OTHER SYMPTOMS: Do you have any  other symptoms?  (e.g., chills, fatigue, headache, loss of smell or taste, muscle pain, sore throat)     Chills, fatigue 8. COVID-19 DIAGNOSIS: How do you know that you have COVID? (e.g., positive lab test or self-test, diagnosed by doctor or NP/PA, symptoms after exposure).     At-home test 10. COVID-19 VACCINE: Have you had the COVID-19 vaccine? If Yes, ask: When did you last get it?       Unsure, does not think so 11. HIGH RISK DISEASE: Do you have any chronic medical problems? (e.g., asthma, heart or lung disease, weak immune system, obesity, etc.)       DM,  13. O2 SATURATION MONITOR:  Do you use an oxygen saturation monitor (pulse oximeter) at home? If Yes, ask What is your reading (oxygen level) today? What is your usual oxygen saturation reading? (e.g., 95%)       Does not have the equip  Would like antiviral, declined scheduling appt.  Protocols used: COVID-19 - Diagnosed or Suspected-A-AH

## 2024-07-03 NOTE — Telephone Encounter (Signed)
 Pt informed to schedule acute visit or go to [facility]

## 2024-07-21 ENCOUNTER — Telehealth: Payer: Self-pay | Admitting: Nurse Practitioner

## 2024-07-21 NOTE — Telephone Encounter (Signed)
 Noted

## 2024-07-23 DIAGNOSIS — N189 Chronic kidney disease, unspecified: Secondary | ICD-10-CM | POA: Diagnosis not present

## 2024-07-23 DIAGNOSIS — E559 Vitamin D deficiency, unspecified: Secondary | ICD-10-CM | POA: Diagnosis not present

## 2024-07-23 DIAGNOSIS — I10 Essential (primary) hypertension: Secondary | ICD-10-CM | POA: Diagnosis not present

## 2024-07-23 DIAGNOSIS — E211 Secondary hyperparathyroidism, not elsewhere classified: Secondary | ICD-10-CM | POA: Diagnosis not present

## 2024-07-23 DIAGNOSIS — E119 Type 2 diabetes mellitus without complications: Secondary | ICD-10-CM | POA: Diagnosis not present

## 2024-07-23 DIAGNOSIS — D631 Anemia in chronic kidney disease: Secondary | ICD-10-CM | POA: Diagnosis not present

## 2024-07-23 DIAGNOSIS — R809 Proteinuria, unspecified: Secondary | ICD-10-CM | POA: Diagnosis not present

## 2024-07-23 LAB — VITAMIN D 25 HYDROXY (VIT D DEFICIENCY, FRACTURES): Vit D, 25-Hydroxy: 40.4

## 2024-07-23 LAB — HEMOGLOBIN A1C: Hemoglobin A1C: 9.5

## 2024-07-23 LAB — MICROALBUMIN / CREATININE URINE RATIO: Microalb Creat Ratio: 49

## 2024-07-23 LAB — COMPREHENSIVE METABOLIC PANEL WITH GFR: eGFR: 51

## 2024-07-24 ENCOUNTER — Other Ambulatory Visit: Payer: Self-pay | Admitting: Family Medicine

## 2024-07-31 DIAGNOSIS — E1122 Type 2 diabetes mellitus with diabetic chronic kidney disease: Secondary | ICD-10-CM | POA: Diagnosis not present

## 2024-07-31 DIAGNOSIS — R809 Proteinuria, unspecified: Secondary | ICD-10-CM | POA: Diagnosis not present

## 2024-07-31 DIAGNOSIS — N1831 Chronic kidney disease, stage 3a: Secondary | ICD-10-CM | POA: Diagnosis not present

## 2024-07-31 DIAGNOSIS — I129 Hypertensive chronic kidney disease with stage 1 through stage 4 chronic kidney disease, or unspecified chronic kidney disease: Secondary | ICD-10-CM | POA: Diagnosis not present

## 2024-08-02 ENCOUNTER — Ambulatory Visit

## 2024-08-02 ENCOUNTER — Ambulatory Visit
Admission: RE | Admit: 2024-08-02 | Discharge: 2024-08-02 | Disposition: A | Discharge status: Home / Self Care | Payer: Self-pay | Source: Ambulatory Visit | Attending: Nurse Practitioner | Admitting: Nurse Practitioner

## 2024-08-02 VITALS — BP 111/69 | HR 68 | Temp 98.4°F | Resp 20

## 2024-08-02 DIAGNOSIS — R059 Cough, unspecified: Secondary | ICD-10-CM | POA: Diagnosis not present

## 2024-08-02 DIAGNOSIS — R9389 Abnormal findings on diagnostic imaging of other specified body structures: Secondary | ICD-10-CM

## 2024-08-02 DIAGNOSIS — R062 Wheezing: Secondary | ICD-10-CM | POA: Diagnosis not present

## 2024-08-02 DIAGNOSIS — R918 Other nonspecific abnormal finding of lung field: Secondary | ICD-10-CM | POA: Diagnosis not present

## 2024-08-02 MED ORDER — PROMETHAZINE-DM 6.25-15 MG/5ML PO SYRP: 5.0000 mL | ORAL_SOLUTION | Freq: Four times a day (QID) | ORAL | 0 refills | Status: AC | PRN

## 2024-08-02 MED ORDER — PREDNISONE 10 MG PO TABS: 10.0000 mg | ORAL_TABLET | Freq: Every day | ORAL | 0 refills | Status: AC

## 2024-08-02 MED ORDER — AMOXICILLIN-POT CLAVULANATE 875-125 MG PO TABS: 1.0000 | ORAL_TABLET | Freq: Two times a day (BID) | ORAL | 0 refills | Status: AC

## 2024-08-02 NOTE — ED Triage Notes (Signed)
 Pt reports cough, wheezing, productive cough. X 3 weeks pt states he has tried his albuterol  inhaler, Singulair , and humidifier and has found no relief.

## 2024-08-02 NOTE — Discharge Instructions (Signed)
 The chest x-ray recommends that you have a CT of your chest.  In the interim, I have treated you with an antibiotic, a steroid, and something for your cough.  Continue use of your albuterol  inhaler as needed for wheezing. Take medication as prescribed. Increase fluids and allow for plenty of rest. Recommend continued use of a humidifier in your bedroom at nighttime during sleep and sleeping elevated on pillows while symptoms persist. If you desire, you can go to the emergency department for the CT of your chest, or you can wait for follow-up with your PCP to order the scan as an outpatient. Follow-up as needed.

## 2024-08-02 NOTE — ED Provider Notes (Signed)
 [facility]    CSN: [id number] Arrival date & time: 08/02/24  0847      History   Chief Complaint Chief Complaint  Patient presents with   Cough    Entered by patient    HPI Travis Herrera is a 68 y.o. male.   The history is provided by the patient and the spouse.   Patient presents with his spouse for complaints of cough and wheeziness been present for the past 3 weeks.  Patient states symptoms worsen at night.  He states that during the day, he feels fine.  He states at night, he coughs to the point where he is coughing up phlegm.  States that he notices the cough more when he is lying on his side.  States that he has had to sleep in his recliner to get relief.  Patient denies fever, chills, chest pain, abdominal pain, nausea, vomiting, or diarrhea.  Patient states that he does have an albuterol  inhaler and has been taking Singulair .  States that he has been diagnosed with seasonal asthma.  States he has also been taking over-the-counter cough and cold medications with minimal relief.  States that the cough is impacting his sleep.  Patient reports that he is diabetic.  Per review of his chart, last A1c was 8.2 in June.  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   Seasonal allergies 03/12/2019   Quadriceps tendon rupture, right, subsequent encounter s/p repair 03/19/18    Hypertension goal BP (blood pressure) < 130/80 07/27/2015   Osteoarthritis of left knee  05/05/2015   Erectile dysfunction 10/01/2014   Mixed hyperlipidemia 05/03/2014   Obesity (BMI 30.0-34.9) 05/03/2014   Type 2 diabetes mellitus with stage 2 chronic kidney disease, with long-term current use of insulin  (HCC) 12/08/2013    Past Surgical History:  Procedure Laterality Date   CATARACT EXTRACTION Right 08/26/2021   CATARACT EXTRACTION W/PHACO  07/29/2012   Procedure: CATARACT EXTRACTION PHACO AND INTRAOCULAR LENS PLACEMENT (IOC);  Surgeon: Cherene Mania, MD;  Location: [facility];  Service: Ophthalmology;  Laterality: Left;  CDE=1.66   CHOLECYSTECTOMY  2007   [name]   EYE SURGERY Left 2013   cataract   QUADRICEPS TENDON REPAIR Right 03/19/2018   Procedure: REPAIR QUADRICEP TENDON;  Surgeon: Margrette Taft BRAVO, MD;  Location: [facility];  Service: Orthopedics;  Laterality: Right;       Home Medications    Prior to Admission medications   Medication Sig Start Date End Date Taking? Authorizing Provider  amoxicillin-clavulanate (AUGMENTIN) 875-125 MG tablet Take 1 tablet by mouth every 12 (twelve) hours. 08/02/24  Yes Leath-Warren, Etta PARAS, NP  predniSONE  (DELTASONE ) 10 MG tablet Take 1 tablet (10 mg total) by mouth daily with breakfast for 7 days. 08/02/24 08/09/24 Yes Leath-Warren, Etta PARAS, NP  promethazine-dextromethorphan (PROMETHAZINE-DM) 6.25-15 MG/5ML syrup Take 5 mLs by mouth 4 (four) times daily as needed. 08/02/24  Yes Leath-Warren, Etta PARAS, NP  albuterol  (VENTOLIN  HFA) 108 (90 Base) MCG/ACT inhaler Inhale 2 puffs into the lungs every  6 (six) hours as needed for wheezing or shortness of breath. 04/07/24   Antonetta Rollene BRAVO, MD  albuterol  (VENTOLIN  HFA) 108 (90 Base) MCG/ACT inhaler INHALE 2 PUFFS BY MOUTH EVERY 6 HOURS AS NEEDED FOR WHEEZING OR SHORTNESS OF BREATH 04/28/24   Antonetta Rollene BRAVO, MD  amLODipine  (NORVASC ) 10 MG tablet Take 1 tablet by mouth once daily 05/15/24   Antonetta Rollene BRAVO, MD  aspirin EC 81 MG tablet Take 81 mg by mouth daily.    [provider]  benazepril  (LOTENSIN ) 20 MG tablet Take 1 tablet by mouth once daily 04/08/24   Antonetta Rollene BRAVO, MD  Blood Glucose Monitoring Suppl (ONETOUCH VERIO FLEX SYSTEM) w/Device KIT USE   TO CHECK GLUCOSE TWICE DAILY (AS  BACKUP  FOR  LIBRE) 11/19/23   Therisa [name] PARAS, NP  Continuous Glucose Receiver (FREESTYLE LIBRE READER) DEVI Use to check blood sugar daily dx E11.65 09/25/23   Antonetta Rollene BRAVO, MD  Continuous Glucose Sensor (FREESTYLE LIBRE 2 SENSOR) MISC USE FOR CONTINUOUS GLUCOSE MONITORING. CHANGE EVERY 14 DAYS 07/24/24   Antonetta Rollene BRAVO, MD  escitalopram  (LEXAPRO ) 10 MG tablet Take 1 tablet (10 mg total) by mouth daily. 02/12/24   Antonetta Rollene BRAVO, MD  FARXIGA  5 MG TABS tablet Take 1/2 tablet by mouth daily as instructed. 04/17/24   Therisa [name] PARAS, NP  glucose blood (ONETOUCH VERIO) test strip USE 1 STRIP TO CHECK GLUCOSE THREE TIMES DAILY AS DIRECTED DX e11.65 11/17/20   Antonetta Rollene BRAVO, MD  insulin  degludec (TRESIBA FLEXTOUCH) 100 UNIT/ML FlexTouch Pen Inject 40 Units into the skin at bedtime. Patient taking differently: Inject 10 Units into the skin at bedtime.    [provider]  Insulin  Pen Needle 31G X 8 MM MISC 1 each by Does not apply route 2 (two) times daily. 07/04/17   Antonetta Rollene BRAVO, MD  Lancets (FREESTYLE) lancets Use as instructed bid 05/03/16   Nida, Gebreselassie W, MD  montelukast  (SINGULAIR ) 10 MG tablet TAKE 1 TABLET BY MOUTH AT BEDTIME 07/24/24   Antonetta Rollene BRAVO, MD  [facility] LANCETS 33G MISC Three times daily testing dx e11.65 02/13/18   Antonetta Rollene BRAVO, MD  rosuvastatin  (CRESTOR ) 10 MG tablet Take 1 tablet by mouth once daily 06/19/24   Antonetta Rollene BRAVO, MD  Semaglutide,0.25 or 0.5MG /DOS, (OZEMPIC, 0.25 OR 0.5 MG/DOSE,) 2 MG/1.5ML SOPN Inject 1 mg into the skin once a week.    [provider]  triamterene -hydrochlorothiazide  (MAXZIDE-25) 37.5-25 MG tablet Take 1 tablet by mouth once daily 05/21/24   Antonetta Rollene BRAVO, MD  UNABLE TO FIND Med Name: Power scooter  DX: F82.87 03/05/23   Antonetta Rollene BRAVO, MD  vitamin C (ASCORBIC ACID) 500 MG tablet Take 500 mg by mouth daily.    [provider]    Family History Family History  Problem Relation Age of Onset   Cancer Sister    Early death Father        car accident    Cancer Brother    Hypertension Brother    Diabetes Brother    Hypertension Brother     Social History Social History   Tobacco Use   Smoking status: Never   Smokeless tobacco: Never  Vaping Use   Vaping status: Never Used  Substance Use Topics   Alcohol use: No   Drug use: No     Allergies   Hydrocodone  and Oxycodone    Review of Systems Review of Systems Per HPI  Physical  Exam Triage Vital Signs ED Triage Vitals  Encounter Vitals Group     BP 08/02/24 0851 111/69     Girls Systolic BP Percentile --      Girls Diastolic BP Percentile --      Boys Systolic BP Percentile --      Boys Diastolic BP Percentile --      Pulse Rate 08/02/24 0851 68     Resp 08/02/24 0851 20     Temp 08/02/24 0851 98.4 F (36.9 C)     Temp Source 08/02/24 0851 Oral     SpO2 08/02/24 0851 98 %     Weight --      Height --      Head Circumference --      Peak Flow --      Pain Score 08/02/24 0853 0     Pain Loc --      Pain Education --      Exclude from Growth Chart --    No data found.  Updated Vital Signs BP 111/69 (BP Location: Right Arm)   Pulse 68   Temp 98.4 F (36.9 C) (Oral)   Resp 20   SpO2 98%   Visual Acuity Right Eye Distance:   Left Eye Distance:   Bilateral Distance:    Right Eye Near:   Left Eye Near:    Bilateral Near:     Physical Exam Vitals and nursing note reviewed.  Constitutional:      General: He is not in acute distress.    Appearance: Normal appearance.  HENT:     Head: Normocephalic.     Right Ear: Tympanic membrane, ear canal and external ear normal.     Left Ear: Tympanic membrane, ear canal and external ear  normal.     Nose: Nose normal.     Mouth/Throat:     Mouth: Mucous membranes are moist.  Eyes:     Extraocular Movements: Extraocular movements intact.     Conjunctiva/sclera: Conjunctivae normal.     Pupils: Pupils are equal, round, and reactive to light.  Cardiovascular:     Rate and Rhythm: Normal rate and regular rhythm.     Pulses: Normal pulses.     Heart sounds: Normal heart sounds.  Pulmonary:     Effort: Pulmonary effort is normal. No respiratory distress.     Breath sounds: Normal breath sounds. No stridor. No wheezing, rhonchi or rales.  Musculoskeletal:     Cervical back: Normal range of motion.  Skin:    General: Skin is warm and dry.  Neurological:     General: No focal deficit present.     Mental Status: He is alert and oriented to person, place, and time.  Psychiatric:        Mood and Affect: Mood normal.        Behavior: Behavior normal.      UC Treatments / Results  Labs (all labs ordered are listed, but only abnormal results are displayed) Labs Reviewed - No data to display  EKG   Radiology DG Chest 2 View Result Date: 08/02/2024 CLINICAL DATA:  wheezing and cough x 3 weeks EXAM: CHEST - 2 VIEW COMPARISON:  October 19, 2017 FINDINGS: The cardiac silhouette is normal. No pleural effusion. No pneumothorax. There is a rounded opacity along the RIGHT inferior perihilar border. Visualized abdomen is unremarkable. Multilevel degenerative changes of the thoracic spine. IMPRESSION: There is a rounded opacity along the RIGHT inferior perihilar border. This could reflect a  prominent pulmonary vessel or airspace opacity such as atelectasis, however a pulmonary nodule is not excluded. Recommend further evaluation with dedicated CT chest without contrast versus follow-up PA and lateral chest radiograph in 4 weeks. Electronically Signed   By: Corean Salter M.D.   On: 08/02/2024 09:24    Procedures Procedures (including critical care time)  Medications Ordered in  UC Medications - No data to display  Initial Impression / Assessment and Plan / UC Course  I have reviewed the triage vital signs and the nursing notes.  Pertinent labs & imaging results that were available during my care of the patient were reviewed by me and considered in my medical decision making (see chart for details).  Chest x-ray showed a rounded opacity along the right inferior perihilar border.  X-ray suggested that this could be atelectasis, but could not exclude a pulmonary nodule.  On exam, the patient's lung sounds are clear throughout, his room air sats are at 98%.  He reports he has had wheezing that persists only at night and when he is lying down.  Will treat patient for possible lower respiratory infection with Augmentin 875/125 mg.  Will also prescribe low-dose prednisone  10 mg for bronchial inflammation.  Promethazine DM prescribed for cough.  Advised patient regarding finding on chest x-ray and advised that a CT of his chest was recommended.  Patient was given indications regarding follow-up.  Patient was in agreement with this plan of care and verbalizes understanding.  All questions were answered.  Patient stable for discharge.  Final Clinical Impressions(s) / UC Diagnoses   Final diagnoses:  Wheezing  Cough, unspecified type     Discharge Instructions      The chest x-ray recommends that you have a CT of your chest.  In the interim, I have treated you with an antibiotic, a steroid, and something for your cough.  Continue use of your albuterol  inhaler as needed for wheezing. Take medication as prescribed. Increase fluids and allow for plenty of rest. Recommend continued use of a humidifier in your bedroom at nighttime during sleep and sleeping elevated on pillows while symptoms persist. If you desire, you can go to the emergency department for the CT of your chest, or you can wait for follow-up with your PCP to order the scan as an outpatient. Follow-up as  needed.     ED Prescriptions     Medication Sig Dispense Auth. Provider   amoxicillin-clavulanate (AUGMENTIN) 875-125 MG tablet Take 1 tablet by mouth every 12 (twelve) hours. 14 tablet Leath-Warren, Etta PARAS, NP   predniSONE  (DELTASONE ) 10 MG tablet Take 1 tablet (10 mg total) by mouth daily with breakfast for 7 days. 7 tablet Leath-Warren, Etta PARAS, NP   promethazine-dextromethorphan (PROMETHAZINE-DM) 6.25-15 MG/5ML syrup Take 5 mLs by mouth 4 (four) times daily as needed. 118 mL Leath-Warren, Etta PARAS, NP      PDMP not reviewed this encounter.   Gilmer Etta PARAS, NP 08/02/24 1157

## 2024-08-07 ENCOUNTER — Ambulatory Visit: Admitting: Nurse Practitioner

## 2024-08-07 ENCOUNTER — Encounter: Payer: Self-pay | Admitting: Nurse Practitioner

## 2024-08-07 VITALS — BP 110/70 | HR 63 | Ht 70.0 in | Wt 191.8 lb

## 2024-08-07 DIAGNOSIS — N1831 Chronic kidney disease, stage 3a: Secondary | ICD-10-CM | POA: Diagnosis not present

## 2024-08-07 DIAGNOSIS — I10 Essential (primary) hypertension: Secondary | ICD-10-CM

## 2024-08-07 DIAGNOSIS — E1122 Type 2 diabetes mellitus with diabetic chronic kidney disease: Secondary | ICD-10-CM | POA: Diagnosis not present

## 2024-08-07 DIAGNOSIS — Z7984 Long term (current) use of oral hypoglycemic drugs: Secondary | ICD-10-CM

## 2024-08-07 DIAGNOSIS — Z7985 Long-term (current) use of injectable non-insulin antidiabetic drugs: Secondary | ICD-10-CM | POA: Diagnosis not present

## 2024-08-07 DIAGNOSIS — E782 Mixed hyperlipidemia: Secondary | ICD-10-CM

## 2024-08-07 DIAGNOSIS — Z794 Long term (current) use of insulin: Secondary | ICD-10-CM

## 2024-08-07 MED ORDER — FREESTYLE LIBRE 2 PLUS SENSOR MISC: 3 refills | Status: AC

## 2024-08-07 MED ORDER — INSULIN LISPRO (1 UNIT DIAL) 100 UNIT/ML (KWIKPEN): 5.0000 [IU] | PEN_INJECTOR | Freq: Three times a day (TID) | SUBCUTANEOUS | 1 refills | Status: AC

## 2024-08-07 NOTE — Progress Notes (Signed)
 08/07/2024  Endocrinology follow-up note   Subjective:    Patient ID: Travis Herrera, male    DOB: [birth year],    Past Medical History:  Diagnosis Date   COVID-19 03/29/2021   Diabetes mellitus without complication (HCC) 10/16/1993   insulin  started in 2012   Hyperlipemia 10/17/2011   Hypertension 10/16/2008   Past Surgical History:  Procedure Laterality Date   CATARACT EXTRACTION Right 08/26/2021   CATARACT EXTRACTION W/PHACO  07/29/2012   Procedure: CATARACT EXTRACTION PHACO AND INTRAOCULAR LENS PLACEMENT (IOC);  Surgeon: Cherene Mania, MD;  Location: [facility];  Service: Ophthalmology;  Laterality: Left;  CDE=1.66   CHOLECYSTECTOMY  2007   Travis Herrera   EYE SURGERY Left 2013   cataract   QUADRICEPS TENDON REPAIR Right 03/19/2018   Procedure: REPAIR QUADRICEP TENDON;  Surgeon: Margrette Taft BRAVO, MD;  Location: [facility];  Service: Orthopedics;  Laterality: Right;   Social History   Socioeconomic History   Marital status: Married    Spouse name: Not on file   Number of children: 4   Years of education: Not on file   Highest education level: Not on file  Occupational History   Not on file  Tobacco Use   Smoking status: Never   Smokeless tobacco: Never  Vaping Use   Vaping status: Never Used  Substance and Sexual Activity   Alcohol use: No   Drug use: No   Sexual activity: Yes    Birth control/protection: None  Other Topics Concern   Not on file  Social History Narrative   Not on file   Social Drivers of Health   Financial Resource Strain: Low Risk  (04/15/2024)   Overall Financial Resource Strain (CARDIA)    Difficulty of Paying Living Expenses: Not hard at all  Food Insecurity: No Food Insecurity (04/15/2024)   Hunger Vital Sign    Worried About Running Out of Food in the Last Year: Never true    Ran Out of Food in the Last Year: Never true  Transportation Needs: No Transportation Needs (04/15/2024)   PRAPARE - Administrator, Civil Service  (Medical): No    Lack of Transportation (Non-Medical): No  Physical Activity: Sufficiently Active (04/15/2024)   Exercise Vital Sign    Days of Exercise per Week: 7 days    Minutes of Exercise per Session: 60 min  Stress: No Stress Concern Present (04/15/2024)   [employer] of Occupational Health - Occupational Stress Questionnaire    Feeling of Stress: Not at all  Social Connections: Socially Integrated (04/15/2024)   Social Connection and Isolation Panel    Frequency of Communication with Friends and Family: More than three times a week    Frequency of Social Gatherings with Friends and Family: More than three times a week    Attends Religious Services: More than 4 times per year    Active Member of [employer] or Organizations: Yes    Attends Banker Meetings: More than 4 times per year    Marital Status: Married   Outpatient Encounter Medications as of 08/07/2024  Medication Sig   albuterol  (VENTOLIN  HFA) 108 (90 Base) MCG/ACT inhaler Inhale 2 puffs into the lungs every 6 (six) hours as needed for wheezing or shortness of breath.   albuterol  (VENTOLIN  HFA) 108 (90 Base) MCG/ACT inhaler INHALE 2 PUFFS BY MOUTH EVERY 6 HOURS AS NEEDED FOR WHEEZING OR SHORTNESS OF BREATH   amLODipine  (NORVASC ) 10 MG tablet Take 1 tablet by mouth once daily  amoxicillin-clavulanate (AUGMENTIN) 875-125 MG tablet Take 1 tablet by mouth every 12 (twelve) hours.   aspirin EC 81 MG tablet Take 81 mg by mouth daily.   benazepril  (LOTENSIN ) 20 MG tablet Take 1 tablet by mouth once daily   Blood Glucose Monitoring Suppl (ONETOUCH VERIO FLEX SYSTEM) w/Device KIT USE   TO CHECK GLUCOSE TWICE DAILY (AS  BACKUP  FOR  LIBRE)   Continuous Glucose Receiver (FREESTYLE LIBRE READER) DEVI Use to check blood sugar daily dx E11.65   Continuous Glucose Sensor (FREESTYLE LIBRE 2 PLUS SENSOR) MISC Change sensor every 15 days.   escitalopram  (LEXAPRO ) 10 MG tablet Take 1 tablet (10 mg total) by mouth daily.   FARXIGA  5  MG TABS tablet Take 1/2 tablet by mouth daily as instructed.   glucose blood (ONETOUCH VERIO) test strip USE 1 STRIP TO CHECK GLUCOSE THREE TIMES DAILY AS DIRECTED DX e11.65   insulin  degludec (TRESIBA FLEXTOUCH) 100 UNIT/ML FlexTouch Pen Inject 40 Units into the skin at bedtime. (Patient taking differently: Inject 50 Units into the skin at bedtime.)   insulin  lispro (HUMALOG KWIKPEN) 100 UNIT/ML KwikPen Inject 5-11 Units into the skin 3 (three) times daily.   Insulin  Pen Needle 31G X 8 MM MISC 1 each by Does not apply route 2 (two) times daily.   Lancets (FREESTYLE) lancets Use as instructed bid   montelukast  (SINGULAIR ) 10 MG tablet TAKE 1 TABLET BY MOUTH AT BEDTIME   ONETOUCH DELICA LANCETS 33G MISC Three times daily testing dx e11.65   predniSONE  (DELTASONE ) 10 MG tablet Take 1 tablet (10 mg total) by mouth daily with breakfast for 7 days.   promethazine-dextromethorphan (PROMETHAZINE-DM) 6.25-15 MG/5ML syrup Take 5 mLs by mouth 4 (four) times daily as needed.   rosuvastatin  (CRESTOR ) 10 MG tablet Take 1 tablet by mouth once daily   Semaglutide,0.25 or 0.5MG /DOS, (OZEMPIC, 0.25 OR 0.5 MG/DOSE,) 2 MG/1.5ML SOPN Inject 1 mg into the skin once a week.   triamterene -hydrochlorothiazide  (MAXZIDE-25) 37.5-25 MG tablet Take 1 tablet by mouth once daily   UNABLE TO FIND Med Name: Power scooter  DX: M17.12   vitamin C (ASCORBIC ACID) 500 MG tablet Take 500 mg by mouth daily.   [DISCONTINUED] Continuous Glucose Sensor (FREESTYLE LIBRE 2 SENSOR) MISC USE FOR CONTINUOUS GLUCOSE MONITORING. CHANGE EVERY 14 DAYS   No facility-administered encounter medications on file as of 08/07/2024.   ALLERGIES: Allergies  Allergen Reactions   Hydrocodone     Oxycodone     VACCINATION STATUS: Immunization History  Administered Date(s) Administered   Influenza Split 07/17/2014   Influenza, Seasonal, Injecte, Preservative Fre 08/07/2023   Influenza,inj,Quad PF,6+ Mos 08/16/2015, 07/26/2016, 08/02/2017,  07/19/2018, 06/16/2019, 07/02/2020, 11/22/2022   Influenza-Unspecified 08/02/2021   Moderna Sars-Covid-2 Vaccination 12/27/2019, 01/28/2020   PFIZER(Purple Top)SARS-COV-2 Vaccination 09/24/2020   PNEUMOCOCCAL CONJUGATE-20 10/26/2021   Pneumococcal Polysaccharide-23 09/15/2019    Diabetes He presents for his follow-up diabetic visit. He has type 2 diabetes mellitus. Onset time: He was diagnosed at approximate age of 32 years. His disease course has been worsening. There are no hypoglycemic associated symptoms. Pertinent negatives for hypoglycemia include no confusion, pallor or seizures. Pertinent negatives for diabetes include no fatigue, no polydipsia, no polyphagia, no polyuria and no weakness. There are no hypoglycemic complications. Symptoms are stable. Diabetic complications include nephropathy. Risk factors for coronary artery disease include diabetes mellitus, dyslipidemia, hypertension, family history, male sex, sedentary lifestyle and obesity. Current diabetic treatment includes oral agent (monotherapy) and insulin  injections (and Ozempic). He is compliant with treatment most of  the time. His weight is fluctuating minimally. He is following a generally healthy diet. When asked about meal planning, he reported none. He has not had a previous visit with a dietitian. He rarely participates in exercise. His home blood glucose trend is increasing steadily. His breakfast blood glucose range is generally 90-110 mg/dl. His bedtime blood glucose range is generally >200 mg/dl. His overall blood glucose range is >200 mg/dl. (He presents today, accompanied by his wife, with his CGM showing at target fasting and above target postprandial readings.  His recent A1c, checked on 10/8 was 9.5%, increasing from last visit of 8.2%.  He has been on antibiotics for bronchitis, and on steroids for his wheezing likely causing this shift in control.  He is frustrated that he is not better controlled as he is working out  and eating right.  Analysis of his CGM shows TIR 38%, TAR 62%, TBR 0% with a GMI of 8.1%.) An ACE inhibitor/angiotensin II receptor blocker is being taken. He does not see a podiatrist.Eye exam is current.    Review of systems  Constitutional: + stable body weight,  current Body mass index is 27.52 kg/m. , no fatigue, no subjective hyperthermia, no subjective hypothermia Eyes: no blurry vision, no xerophthalmia ENT: no sore throat, no nodules palpated in throat, no dysphagia/odynophagia, no hoarseness Cardiovascular: no chest pain, no shortness of breath, no palpitations, no leg swelling Respiratory: + cough, no shortness of breath, + wheezing- worse at night- on steroids for a few more days. Gastrointestinal: no nausea/vomiting/diarrhea Musculoskeletal: no muscle/joint aches Skin: no rashes, no hyperemia Neurological: no tremors, no numbness, no tingling, no dizziness Psychiatric: no depression, no anxiety    Objective:    BP 110/70 (BP Location: Left Arm, Patient Position: Sitting, Cuff Size: Large)   Pulse 63   Ht 5' 10 (1.778 m)   Wt 191 lb 12.8 oz (87 kg)   BMI 27.52 kg/m   Wt Readings from Last 3 Encounters:  08/07/24 191 lb 12.8 oz (87 kg)  04/15/24 190 lb (86.2 kg)  04/07/24 193 lb (87.5 kg)    BP Readings from Last 3 Encounters:  08/07/24 110/70  08/02/24 111/69  05/29/24 116/74     Physical Exam- Limited  Constitutional:  Body mass index is 27.52 kg/m. , not in acute distress, normal state of mind Eyes:  EOMI, no exophthalmos Musculoskeletal: no gross deformities, strength intact in all four extremities, no gross restriction of joint movements Skin:  no rashes, no hyperemia Neurological: no tremor with outstretched hands    Diabetic Foot Exam - Simple   No data filed     Results for orders placed or performed in visit on 08/07/24  VITAMIN D  25 Hydroxy (Vit-D Deficiency, Fractures)   Collection Time: 07/23/24 12:00 AM  Result Value Ref Range   Vit  D, 25-Hydroxy 40.4   Microalbumin / creatinine urine ratio   Collection Time: 07/23/24 12:00 AM  Result Value Ref Range   Microalb Creat Ratio 49   Comprehensive metabolic panel with GFR   Collection Time: 07/23/24 12:00 AM  Result Value Ref Range   eGFR 51   Hemoglobin A1c   Collection Time: 07/23/24 12:00 AM  Result Value Ref Range   Hemoglobin A1C 9.5    Diabetic Labs (most recent): Lab Results  Component Value Date   HGBA1C 9.5 07/23/2024   HGBA1C 8.2 (A) 04/07/2024   HGBA1C 9.2 (A) 11/27/2023   MICROALBUR 1.0 10/31/2019   MICROALBUR 1.3 12/06/2018   MICROALBUR 1.5  10/24/2016   Lipid Panel     Component Value Date/Time   CHOL 114 07/26/2023 0847   TRIG 66 07/26/2023 0847   HDL 36 (L) 07/26/2023 0847   CHOLHDL 3.2 07/26/2023 0847   CHOLHDL 3.2 07/13/2020 0840   VLDL 12 01/16/2017 0940   LDLCALC 64 07/26/2023 0847   LDLCALC 36 07/13/2020 0840      Assessment & Plan:   1) Diabetes mellitus type 2 without complications, with long term insulin  use (HCC)  He presents today, accompanied by his wife, with his CGM showing at target fasting and above target postprandial readings.  His recent A1c, checked on 10/8 was 9.5%, increasing from last visit of 8.2%.  He has been on antibiotics for bronchitis, and on steroids for his wheezing likely causing this shift in control.  He is frustrated that he is not better controlled as he is working out and eating right.  Analysis of his CGM shows TIR 38%, TAR 62%, TBR 0% with a GMI of 8.1%.  Recent labs reviewed.  - Patient remains at a high risk for more acute and chronic complications of diabetes which include CAD, CVA, CKD, retinopathy, and neuropathy. These are all discussed in detail with the patient.  - Nutritional counseling repeated/built upon at each appointment.  - The patient admits there is a room for improvement in their diet and drink choices. -  Suggestion is made for the patient to avoid simple carbohydrates from  their diet including Cakes, Sweet Desserts / Pastries, Ice Cream, Soda (diet and regular), Sweet Tea, Candies, Chips, Cookies, Sweet Pastries, Store Bought Juices, Alcohol in Excess of 1-2 drinks a day, Artificial Sweeteners, Coffee Creamer, and Sugar-free Products. This will help patient to have stable blood glucose profile and potentially avoid unintended weight gain.   - I encouraged the patient to switch to unprocessed or minimally processed complex starch and increased protein intake (animal or plant source), fruits, and vegetables.   - Patient is advised to stick to a routine mealtimes to eat 3 meals a day and avoid unnecessary snacks (to snack only to correct hypoglycemia).  - I have approached patient with the following individualized plan to manage diabetes and patient agrees.  He is advised to continue Tresiba 50 units SQ nightly and continue Ozempic 1 mg SQ weekly (gets these from patient assistance).  He is advised to continue his Farxiga  2.5 mg po daily (at advice of his nephrologist).  Will start prandial insulin  with Humalog 5-11 units TID with meals if glucose is above 90 and he is eating (Specific instructions on how to titrate insulin  dosage based on glucose readings given to patient in writing).  He and his wife demonstrated ability to properly use the SSI chart with me today.  I am hoping this will just be a temporary need, but if he does continue to need it in the future, may consider adding Novolog via the Novo Nordisk patient assistance program, which he is already on.  -He is advised to continue monitoring blood glucose at least twice daily using his CGM, before breakfast and before bed, and call the clinic if he gets readings less than 70 or greater than 200 for 3 tests in a row.  - Patient specific target  for A1c; LDL, HDL, Triglycerides, and  Waist Circumference were discussed in detail.  2) BP/HTN: His blood pressure is controlled to target.  He is advised to continue  current medications as prescribed by PCP/nephrology.  3) Lipids/HPL: Most recent lipid  panel from 07/26/23 shows controlled LDL at 64.  He is advised to continue Crestor  10 mg po daily at bedtime.  Side effects and precautions discussed with him.  He is advised to avoid fried foods and butter.  He has labs upcoming with PCP.   4)  Weight/Diet:  His Body mass index is 27.52 kg/m.-is a candidate for modest weight loss.  CDE consult in progress, exercise, and carbohydrates information provided.  5) Chronic Care/Health Maintenance: -Patient on ACEI and Statin medications and encouraged to continue to follow up with Ophthalmology, Podiatrist at least yearly or according to recommendations, and advised to stay away from smoking. I have recommended yearly flu vaccine and pneumonia vaccination at least every 5 years; moderate intensity exercise for up to 150 minutes weekly; and  sleep for at least 7 hours a day.  I advised patient to maintain close follow up with his PCP for primary care needs.       I spent  38  minutes in the care of the patient today including review of labs from CMP, Lipids, Thyroid  Function, Hematology (current and previous including abstractions from other facilities); face-to-face time discussing  his blood glucose readings/logs, discussing hypoglycemia and hyperglycemia episodes and symptoms, medications doses, his options of short and long term treatment based on the latest standards of care / guidelines;  discussion about incorporating lifestyle medicine;  and documenting the encounter. Risk reduction counseling performed per USPSTF guidelines to reduce obesity and cardiovascular risk factors.     Please refer to Patient Instructions for Blood Glucose Monitoring and Insulin /Medications Dosing Guide  in media tab for additional information. Please  also refer to  Patient Self Inventory in the Media  tab for reviewed elements of pertinent patient history.  Travis Herrera  participated in the discussions, expressed understanding, and voiced agreement with the above plans.  All questions were answered to his satisfaction. he is encouraged to contact clinic should he have any questions or concerns prior to his return visit.    Follow up plan: Return in about 3 months (around 11/07/2024) for Diabetes F/U with A1c in office, No previsit labs, Bring meter and logs.   [name] Rio, [facility] [address] [place], [place] [address] Phone: [phone] Fax: [phone]  08/07/2024, 8:44 AM

## 2024-08-12 ENCOUNTER — Other Ambulatory Visit: Payer: Self-pay | Admitting: Family Medicine

## 2024-08-18 ENCOUNTER — Encounter: Payer: Self-pay | Admitting: Family Medicine

## 2024-08-25 ENCOUNTER — Other Ambulatory Visit: Payer: Self-pay | Admitting: Family Medicine

## 2024-08-29 ENCOUNTER — Telehealth: Payer: Self-pay | Admitting: Nurse Practitioner

## 2024-08-29 NOTE — Telephone Encounter (Signed)
 Pt made aware that PAP of Tresiba and pen needles were ready for pick up

## 2024-09-01 NOTE — Telephone Encounter (Signed)
 Pt picked up PAP of Tresiba and pen needles

## 2024-09-01 NOTE — Telephone Encounter (Signed)
 Pt picked up PAP of ozempic 

## 2024-09-01 NOTE — Telephone Encounter (Signed)
 Pt was made aware PAP of Ozempic is ready for pick up

## 2024-09-12 ENCOUNTER — Other Ambulatory Visit: Payer: Self-pay | Admitting: Family Medicine

## 2024-10-18 ENCOUNTER — Other Ambulatory Visit: Payer: Self-pay | Admitting: Family Medicine

## 2024-11-02 ENCOUNTER — Emergency Department (HOSPITAL_COMMUNITY): Admission: EM | Admit: 2024-11-02 | Discharge: 2024-11-02 | Disposition: A | Discharge status: Home / Self Care

## 2024-11-02 DIAGNOSIS — Z794 Long term (current) use of insulin: Secondary | ICD-10-CM | POA: Diagnosis not present

## 2024-11-02 DIAGNOSIS — Z7982 Long term (current) use of aspirin: Secondary | ICD-10-CM | POA: Insufficient documentation

## 2024-11-02 DIAGNOSIS — E162 Hypoglycemia, unspecified: Secondary | ICD-10-CM | POA: Insufficient documentation

## 2024-11-02 LAB — CBG MONITORING, ED
Glucose-Capillary: 89 mg/dL (ref 70–99)
Glucose-Capillary: 101 mg/dL — ABNORMAL HIGH (ref 70–99)
Glucose-Capillary: 86 mg/dL (ref 70–99)

## 2024-11-02 MED ORDER — ACETAMINOPHEN 325 MG PO TABS
650.0000 mg | ORAL_TABLET | Freq: Once | ORAL | Status: AC
  Administered 2024-11-02: 650 mg via ORAL
  Filled 2024-11-02: qty 2

## 2024-11-02 NOTE — ED Notes (Signed)
CBG 89 

## 2024-11-02 NOTE — ED Triage Notes (Signed)
 Pt arrives POV c/o hypoglycemia. He reports that around 0815 he accidentally mistook his short acting insulin  for his long acting and took 30 units of humalog . Pt states that he did not take the long acting thereafter. His CBG at home dropped to 40's. Was seen by EMS and had CBG in 90's and decided to come POV. Pt A+Ox4.

## 2024-11-02 NOTE — Discharge Instructions (Signed)
 We recommend continuing to use your humalog  sliding scale regimen for the remainder of today,  then start back with your next dose of the long acting Tresiba tomorrow morning.  Return for any problems or concerns!

## 2024-11-05 NOTE — ED Provider Notes (Signed)
 " [place] [facility] AT [facility] Provider Note   CSN: [id number] Arrival date & time: 11/02/24  1032     Patient presents with: Hypoglycemia   Travis Herrera is a 69 y.o. male presenting for evaluation of hypoglycemia after accidentally taking Humalog  30 units around 0815 today, when he thought he was injecting his long acting Tresiba (although med list states 40 units,  this dose was reduced to 30 units daily recently).  He normally takes sliding scale Humalog  3 times daily with meals, between 5 to 11 units.  He started feeling weak and c when he checked his CBG it was in the 40s, he ate and drink juice and decided he needed to come in to be evaluated.  When EMS arrived at his home his CBG was in the 90s.  He currently feels well and is without complaint.  He did not take his Tresiba.   The history is provided by the patient.       Prior to Admission medications  Medication Sig Start Date End Date Taking? Authorizing Provider  albuterol  (VENTOLIN  HFA) 108 (90 Base) MCG/ACT inhaler Inhale 2 puffs into the lungs every 6 (six) hours as needed for wheezing or shortness of breath. 04/07/24   Antonetta Rollene BRAVO, MD  albuterol  (VENTOLIN  HFA) 108 (90 Base) MCG/ACT inhaler INHALE 2 PUFFS BY MOUTH EVERY 6 HOURS AS NEEDED FOR WHEEZING OR SHORTNESS OF BREATH 04/28/24   Antonetta Rollene BRAVO, MD  amLODipine  (NORVASC ) 10 MG tablet Take 1 tablet by mouth once daily 08/12/24   Antonetta Rollene BRAVO, MD  amoxicillin -clavulanate (AUGMENTIN ) 875-125 MG tablet Take 1 tablet by mouth every 12 (twelve) hours. 08/02/24   Leath-Warren, Etta PARAS, NP  aspirin EC 81 MG tablet Take 81 mg by mouth daily.    [provider]  benazepril  (LOTENSIN ) 20 MG tablet Take 1 tablet by mouth once daily 04/08/24   Antonetta Rollene BRAVO, MD  Blood Glucose Monitoring Suppl (ONETOUCH VERIO FLEX SYSTEM) w/Device KIT USE   TO CHECK GLUCOSE TWICE DAILY (AS  BACKUP  FOR  LIBRE) 11/19/23   Therisa [name] PARAS,  NP  Continuous Glucose Receiver (FREESTYLE LIBRE READER) DEVI Use to check blood sugar daily dx E11.65 09/25/23   Antonetta Rollene BRAVO, MD  Continuous Glucose Sensor (FREESTYLE LIBRE 2 PLUS SENSOR) MISC Change sensor every 15 days. 08/07/24   Therisa [name] PARAS, NP  escitalopram  (LEXAPRO ) 10 MG tablet Take 1 tablet (10 mg total) by mouth daily. 02/12/24   Antonetta Rollene BRAVO, MD  FARXIGA  5 MG TABS tablet Take 1/2 tablet by mouth daily as instructed. 04/17/24   Therisa [name] PARAS, NP  glucose blood (ONETOUCH VERIO) test strip USE 1 STRIP TO CHECK GLUCOSE THREE TIMES DAILY AS DIRECTED DX e11.65 11/17/20   Antonetta Rollene BRAVO, MD  insulin  degludec (TRESIBA FLEXTOUCH) 100 UNIT/ML FlexTouch Pen Inject 40 Units into the skin at bedtime. Patient taking differently: Inject 50 Units into the skin at bedtime.    [provider]  insulin  lispro (HUMALOG  KWIKPEN) 100 UNIT/ML KwikPen Inject 5-11 Units into the skin 3 (three) times daily. 08/07/24   Therisa [name] PARAS, NP  Insulin  Pen Needle 31G X 8 MM MISC 1 each by Does not apply route 2 (two) times daily. 07/04/17   Antonetta Rollene BRAVO, MD  Lancets (FREESTYLE) lancets Use as instructed bid 05/03/16   Nida, Gebreselassie W, MD  montelukast  (SINGULAIR ) 10 MG tablet TAKE 1 TABLET BY MOUTH AT BEDTIME 10/20/24   Antonetta,  Rollene BRAVO, MD  [facility] DELICA LANCETS 33G MISC Three times daily testing dx e11.65 02/13/18   Antonetta Rollene BRAVO, MD  promethazine -dextromethorphan (PROMETHAZINE -DM) 6.25-15 MG/5ML syrup Take 5 mLs by mouth 4 (four) times daily as needed. 08/02/24   Leath-Warren, Etta PARAS, NP  rosuvastatin  (CRESTOR ) 10 MG tablet Take 1 tablet by mouth once daily 09/15/24   Antonetta Rollene BRAVO, MD  Semaglutide,0.25 or 0.5MG /DOS, (OZEMPIC, 0.25 OR 0.5 MG/DOSE,) 2 MG/1.5ML SOPN Inject 1 mg into the skin once a week.    [provider]  triamterene -hydrochlorothiazide  (MAXZIDE-25) 37.5-25 MG tablet Take 1 tablet by mouth once daily 08/25/24   Antonetta Rollene BRAVO, MD  UNABLE TO FIND Med Name: Power scooter  DX: F82.87 03/05/23   Antonetta Rollene BRAVO, MD  vitamin C (ASCORBIC ACID) 500 MG tablet Take 500 mg by mouth daily.    [provider]    Allergies: Hydrocodone  and Oxycodone     Review of Systems  Constitutional:  Negative for fever.  HENT:  Negative for congestion and sore throat.   Eyes: Negative.   Respiratory:  Negative for chest tightness and shortness of breath.   Cardiovascular:  Negative for chest pain.  Gastrointestinal:  Negative for abdominal pain and nausea.  Genitourinary: Negative.   Musculoskeletal:  Negative for arthralgias, joint swelling and neck pain.  Skin: Negative.  Negative for rash and wound.  Neurological:  Positive for weakness. Negative for dizziness, light-headedness, numbness and headaches.  Psychiatric/Behavioral: Negative.      Updated Vital Signs BP 113/81   Pulse 71   Resp 16   SpO2 98%   Physical Exam Vitals and nursing note reviewed.  Constitutional:      General: He is not in acute distress.    Appearance: He is well-developed. He is not diaphoretic.  HENT:     Head: Normocephalic and atraumatic.  Cardiovascular:     Rate and Rhythm: Normal rate.  Pulmonary:     Effort: Pulmonary effort is normal.     Breath sounds: No wheezing.  Musculoskeletal:        General: Normal range of motion.  Skin:    General: Skin is warm and dry.  Neurological:     General: No focal deficit present.     Mental Status: He is alert and oriented to person, place, and time.     (all labs ordered are listed, but only abnormal results are displayed) Labs Reviewed  CBG MONITORING, ED - Abnormal; Notable for the following components:      Result Value   Glucose-Capillary 101 (*)    All other components within normal limits  CBG MONITORING, ED  CBG MONITORING, ED    EKG: None  Radiology: No results found.   Procedures   Medications Ordered in the ED  acetaminophen  (TYLENOL ) tablet 650 mg  (650 mg Oral Given 11/02/24 1250)                                    Medical Decision Making Patient presenting for evaluation and monitoring after an accidental overdose of his short acting Humalog  insulin .  Thought he was injecting his 30 unit long-acting Tresiba, instead he used his short acting Humalog .  Patient was in no acute distress during his ED stay.  He did report a mild headache while here and was given dose of Tylenol .  He was monitored in the department and had several CBG checks,  he also had a snack of crackers and peanut butter and his blood sugars remained stable.  He was advised to hold his [place] and only use his sliding scale Humalog  for the remainder of today, returning to his normal schedule tomorrow morning.  As needed follow-up is anticipated.  Amount and/or Complexity of Data Reviewed Labs: ordered.    Details: Labs reviewed including multiple CBG checks, all normal range.  Risk OTC drugs.        Final diagnoses:  Hypoglycemia    ED Discharge Orders     None          Birdena Mliss RIGGERS 11/05/24 2328    Kammerer, Megan L, DO 11/11/24 1535  "

## 2024-11-10 ENCOUNTER — Ambulatory Visit: Admitting: Nurse Practitioner

## 2024-11-12 ENCOUNTER — Other Ambulatory Visit: Payer: Self-pay | Admitting: Family Medicine

## 2024-11-17 ENCOUNTER — Ambulatory Visit: Admitting: Nurse Practitioner

## 2024-11-21 ENCOUNTER — Ambulatory Visit: Admitting: Nurse Practitioner

## 2024-11-21 ENCOUNTER — Telehealth: Payer: Self-pay | Admitting: *Deleted

## 2024-11-21 ENCOUNTER — Encounter: Payer: Self-pay | Admitting: Nurse Practitioner

## 2024-11-21 VITALS — BP 112/74 | HR 54 | Ht 70.0 in | Wt 196.2 lb

## 2024-11-21 DIAGNOSIS — N1831 Chronic kidney disease, stage 3a: Secondary | ICD-10-CM

## 2024-11-21 DIAGNOSIS — I10 Essential (primary) hypertension: Secondary | ICD-10-CM

## 2024-11-21 DIAGNOSIS — Z7984 Long term (current) use of oral hypoglycemic drugs: Secondary | ICD-10-CM

## 2024-11-21 DIAGNOSIS — Z7985 Long-term (current) use of injectable non-insulin antidiabetic drugs: Secondary | ICD-10-CM

## 2024-11-21 DIAGNOSIS — Z794 Long term (current) use of insulin: Secondary | ICD-10-CM

## 2024-11-21 DIAGNOSIS — E782 Mixed hyperlipidemia: Secondary | ICD-10-CM

## 2024-11-21 DIAGNOSIS — E1122 Type 2 diabetes mellitus with diabetic chronic kidney disease: Secondary | ICD-10-CM

## 2024-11-21 LAB — POCT GLYCOSYLATED HEMOGLOBIN (HGB A1C): Hemoglobin A1C: 8 % — AB (ref 4.0–5.6)

## 2024-11-21 MED ORDER — SEMAGLUTIDE (1 MG/DOSE) 4 MG/3ML ~~LOC~~ SOPN: 1.0000 mg | PEN_INJECTOR | SUBCUTANEOUS | 1 refills | Status: AC

## 2024-11-21 NOTE — Progress Notes (Signed)
 "  11/21/2024  Endocrinology follow-up note   Subjective:    Patient ID: Travis Herrera, male    DOB: 1956/08/30,    Past Medical History:  Diagnosis Date   COVID-19 03/29/2021   Diabetes mellitus without complication (HCC) 10/16/1993   insulin  started in 2012   Hyperlipemia 10/17/2011   Hypertension 10/16/2008   Past Surgical History:  Procedure Laterality Date   CATARACT EXTRACTION Right 08/26/2021   CATARACT EXTRACTION W/PHACO  07/29/2012   Procedure: CATARACT EXTRACTION PHACO AND INTRAOCULAR LENS PLACEMENT (IOC);  Surgeon: Cherene Mania, MD;  Location: [facility];  Service: Ophthalmology;  Laterality: Left;  CDE=1.66   CHOLECYSTECTOMY  2007   [name]   EYE SURGERY Left 2013   cataract   QUADRICEPS TENDON REPAIR Right 03/19/2018   Procedure: REPAIR QUADRICEP TENDON;  Surgeon: Margrette Taft BRAVO, MD;  Location: [facility];  Service: Orthopedics;  Laterality: Right;   Social History   Socioeconomic History   Marital status: Married    Spouse name: Not on file   Number of children: 4   Years of education: Not on file   Highest education level: Not on file  Occupational History   Not on file  Tobacco Use   Smoking status: Never   Smokeless tobacco: Never  Vaping Use   Vaping status: Never Used  Substance and Sexual Activity   Alcohol use: No   Drug use: No   Sexual activity: Yes    Birth control/protection: None  Other Topics Concern   Not on file  Social History Narrative   Not on file   Social Drivers of Health   Tobacco Use: Low Risk (11/21/2024)   Patient History    Smoking Tobacco Use: Never    Smokeless Tobacco Use: Never    Passive Exposure: Not on file  Financial Resource Strain: Low Risk (04/15/2024)   Overall Financial Resource Strain (CARDIA)    Difficulty of Paying Living Expenses: Not hard at all  Food Insecurity: No Food Insecurity (04/15/2024)   Epic    Worried About Radiation Protection Practitioner of Food in the Last Year: Never true    Ran Out of Food in the Last  Year: Never true  Transportation Needs: No Transportation Needs (04/15/2024)   Epic    Lack of Transportation (Medical): No    Lack of Transportation (Non-Medical): No  Physical Activity: Sufficiently Active (04/15/2024)   Exercise Vital Sign    Days of Exercise per Week: 7 days    Minutes of Exercise per Session: 60 min  Stress: No Stress Concern Present (04/15/2024)   [employer] of Occupational Health - Occupational Stress Questionnaire    Feeling of Stress: Not at all  Social Connections: Socially Integrated (04/15/2024)   Social Connection and Isolation Panel    Frequency of Communication with Friends and Family: More than three times a week    Frequency of Social Gatherings with Friends and Family: More than three times a week    Attends Religious Services: More than 4 times per year    Active Member of Clubs or Organizations: Yes    Attends Banker Meetings: More than 4 times per year    Marital Status: Married  Depression (PHQ2-9): Low Risk (04/15/2024)   Depression (PHQ2-9)    PHQ-2 Score: 0  Recent Concern: Depression (PHQ2-9) - High Risk (02/12/2024)   Depression (PHQ2-9)    PHQ-2 Score: 16  Alcohol Screen: Low Risk (04/15/2024)   Alcohol Screen    Last Alcohol Screening  Score (AUDIT): 0  Housing: Low Risk (04/15/2024)   Epic    Unable to Pay for Housing in the Last Year: No    Number of Times Moved in the Last Year: 0    Homeless in the Last Year: No  Utilities: Not At Risk (04/15/2024)   Epic    Threatened with loss of utilities: No  Health Literacy: Adequate Health Literacy (04/15/2024)   B1300 Health Literacy    Frequency of need for help with medical instructions: Never   Outpatient Encounter Medications as of 11/21/2024  Medication Sig   albuterol  (VENTOLIN  HFA) 108 (90 Base) MCG/ACT inhaler Inhale 2 puffs into the lungs every 6 (six) hours as needed for wheezing or shortness of breath.   albuterol  (VENTOLIN  HFA) 108 (90 Base) MCG/ACT inhaler INHALE 2 PUFFS  BY MOUTH EVERY 6 HOURS AS NEEDED FOR WHEEZING OR SHORTNESS OF BREATH   amLODipine  (NORVASC ) 10 MG tablet Take 1 tablet by mouth once daily   amoxicillin -clavulanate (AUGMENTIN ) 875-125 MG tablet Take 1 tablet by mouth every 12 (twelve) hours.   aspirin EC 81 MG tablet Take 81 mg by mouth daily.   benazepril  (LOTENSIN ) 20 MG tablet Take 1 tablet by mouth once daily   Blood Glucose Monitoring Suppl (ONETOUCH VERIO FLEX SYSTEM) w/Device KIT USE   TO CHECK GLUCOSE TWICE DAILY (AS  BACKUP  FOR  LIBRE)   Continuous Glucose Receiver (FREESTYLE LIBRE READER) DEVI Use to check blood sugar daily dx E11.65   Continuous Glucose Sensor (FREESTYLE LIBRE 2 PLUS SENSOR) MISC Change sensor every 15 days.   escitalopram  (LEXAPRO ) 10 MG tablet Take 1 tablet (10 mg total) by mouth daily.   FARXIGA  5 MG TABS tablet Take 1/2 tablet by mouth daily as instructed.   glucose blood (ONETOUCH VERIO) test strip USE 1 STRIP TO CHECK GLUCOSE THREE TIMES DAILY AS DIRECTED DX e11.65   insulin  degludec (TRESIBA FLEXTOUCH) 100 UNIT/ML FlexTouch Pen Inject 40 Units into the skin at bedtime. (Patient taking differently: Inject 30 Units into the skin at bedtime.)   Semaglutide , 1 MG/DOSE, 4 MG/3ML SOPN Inject 1 mg as directed once a week.   insulin  lispro (HUMALOG  KWIKPEN) 100 UNIT/ML KwikPen Inject 5-11 Units into the skin 3 (three) times daily.   Insulin  Pen Needle 31G X 8 MM MISC 1 each by Does not apply route 2 (two) times daily.   Lancets (FREESTYLE) lancets Use as instructed bid   montelukast  (SINGULAIR ) 10 MG tablet TAKE 1 TABLET BY MOUTH AT BEDTIME   ONETOUCH DELICA LANCETS 33G MISC Three times daily testing dx e11.65   promethazine -dextromethorphan (PROMETHAZINE -DM) 6.25-15 MG/5ML syrup Take 5 mLs by mouth 4 (four) times daily as needed.   rosuvastatin  (CRESTOR ) 10 MG tablet Take 1 tablet by mouth once daily   triamterene -hydrochlorothiazide  (MAXZIDE-25) 37.5-25 MG tablet Take 1 tablet by mouth once daily   UNABLE TO FIND  Med Name: Power scooter  DX: M17.12   vitamin C (ASCORBIC ACID) 500 MG tablet Take 500 mg by mouth daily.   [DISCONTINUED] Semaglutide ,0.25 or 0.5MG /DOS, (OZEMPIC , 0.25 OR 0.5 MG/DOSE,) 2 MG/1.5ML SOPN Inject 1 mg into the skin once a week.   No facility-administered encounter medications on file as of 11/21/2024.   ALLERGIES: Allergies  Allergen Reactions   Hydrocodone     Oxycodone     VACCINATION STATUS: Immunization History  Administered Date(s) Administered   Influenza Split 07/17/2014   Influenza, Seasonal, Injecte, Preservative Fre 08/07/2023   Influenza,inj,Quad PF,6+ Mos 08/16/2015, 07/26/2016, 08/02/2017, 07/19/2018, 06/16/2019, 07/02/2020,  11/22/2022   Influenza-Unspecified 08/02/2021, 08/19/2024   Moderna Sars-Covid-2 Vaccination 12/27/2019, 01/28/2020   PFIZER(Purple Top)SARS-COV-2 Vaccination 09/24/2020   PNEUMOCOCCAL CONJUGATE-20 10/26/2021   Pneumococcal Polysaccharide-23 09/15/2019    Diabetes He presents for his follow-up diabetic visit. He has type 2 diabetes mellitus. Onset time: He was diagnosed at approximate age of 32 years. His disease course has been improving. There are no hypoglycemic associated symptoms. Pertinent negatives for hypoglycemia include no confusion, pallor or seizures. Pertinent negatives for diabetes include no fatigue, no polydipsia, no polyphagia, no polyuria and no weakness. There are no hypoglycemic complications. Symptoms are stable. Diabetic complications include nephropathy. Risk factors for coronary artery disease include diabetes mellitus, dyslipidemia, hypertension, family history, male sex, sedentary lifestyle and obesity. Current diabetic treatment includes oral agent (monotherapy) and insulin  injections (and Ozempic ). He is compliant with treatment most of the time. His weight is fluctuating minimally. He is following a generally healthy diet. When asked about meal planning, he reported none. He has not had a previous visit with a  dietitian. He rarely participates in exercise. His home blood glucose trend is decreasing steadily. His breakfast blood glucose range is generally 90-110 mg/dl. His lunch blood glucose range is generally 140-180 mg/dl. His dinner blood glucose range is generally 140-180 mg/dl. His bedtime blood glucose range is generally 140-180 mg/dl. His overall blood glucose range is 140-180 mg/dl. (He presents today, accompanied by his wife, with his CGM showing mostly at target glycemic profile.  His POCT A1c today is 8%, improving from last visit of 9.5%.  Analysis of his CGM shows TIR 85%, TAR 15%, TBR 0% with a GMI of 6.8%.  He was seen in the ED recently for hypoglycemia associated with accidentally taking his Humalog  thinking it was his Tresiba pen.  He was on prednisone  between visits for URI and SOB symptoms.) An ACE inhibitor/angiotensin II receptor blocker is being taken. He does not see a podiatrist.Eye exam is current.    Review of systems  Constitutional: + increasing body weight,  current Body mass index is 28.15 kg/m. , no fatigue, no subjective hyperthermia, no subjective hypothermia Eyes: no blurry vision, no xerophthalmia ENT: no sore throat, no nodules palpated in throat, no dysphagia/odynophagia, no hoarseness Cardiovascular: no chest pain, no shortness of breath, no palpitations, no leg swelling Respiratory: no cough, no shortness of breath Gastrointestinal: no nausea/vomiting/diarrhea Musculoskeletal: no muscle/joint aches Skin: no rashes, no hyperemia Neurological: no tremors, no numbness, no tingling, no dizziness Psychiatric: no depression, no anxiety    Objective:    BP 112/74 (BP Location: Left Arm, Patient Position: Sitting, Cuff Size: Large)   Pulse (!) 54 Comment: Recheck Radial  Ht 5' 10 (1.778 m)   Wt 196 lb 3.2 oz (89 kg)   BMI 28.15 kg/m   Wt Readings from Last 3 Encounters:  11/21/24 196 lb 3.2 oz (89 kg)  08/07/24 191 lb 12.8 oz (87 kg)  04/15/24 190 lb (86.2 kg)     BP Readings from Last 3 Encounters:  11/21/24 112/74  11/02/24 113/81  08/07/24 110/70     Physical Exam- Limited  Constitutional:  Body mass index is 28.15 kg/m. , not in acute distress, normal state of mind Eyes:  EOMI, no exophthalmos Musculoskeletal: no gross deformities, strength intact in all four extremities, no gross restriction of joint movements Skin:  no rashes, no hyperemia Neurological: no tremor with outstretched hands    Diabetic Foot Exam - Simple   No data filed     Results for  orders placed or performed in visit on 11/21/24  HgB A1c   Collection Time: 11/21/24  8:28 AM  Result Value Ref Range   Hemoglobin A1C 8.0 (A) 4.0 - 5.6 %   HbA1c POC (<> result, manual entry)     HbA1c, POC (prediabetic range)     HbA1c, POC (controlled diabetic range)     Diabetic Labs (most recent): Lab Results  Component Value Date   HGBA1C 8.0 (A) 11/21/2024   HGBA1C 9.5 07/23/2024   HGBA1C 8.2 (A) 04/07/2024   MICROALBUR 1.0 10/31/2019   MICROALBUR 1.3 12/06/2018   MICROALBUR 1.5 10/24/2016   Lipid Panel     Component Value Date/Time   CHOL 114 07/26/2023 0847   TRIG 66 07/26/2023 0847   HDL 36 (L) 07/26/2023 0847   CHOLHDL 3.2 07/26/2023 0847   CHOLHDL 3.2 07/13/2020 0840   VLDL 12 01/16/2017 0940   LDLCALC 64 07/26/2023 0847   LDLCALC 36 07/13/2020 0840      Assessment & Plan:   1) Diabetes mellitus type 2 without complications, with long term insulin  use (HCC)  He presents today, accompanied by his wife, with his CGM showing mostly at target glycemic profile.  His POCT A1c today is 8%, improving from last visit of 9.5%.  Analysis of his CGM shows TIR 85%, TAR 15%, TBR 0% with a GMI of 6.8%.  He was seen in the ED recently for hypoglycemia associated with accidentally taking his Humalog  thinking it was his Tresiba pen.  He was on prednisone  between visits for URI and SOB symptoms.  Recent labs reviewed.  - Patient remains at a high risk for more  acute and chronic complications of diabetes which include CAD, CVA, CKD, retinopathy, and neuropathy. These are all discussed in detail with the patient.  - Nutritional counseling repeated/built upon at each appointment.  - The patient admits there is a room for improvement in their diet and drink choices. -  Suggestion is made for the patient to avoid simple carbohydrates from their diet including Cakes, Sweet Desserts / Pastries, Ice Cream, Soda (diet and regular), Sweet Tea, Candies, Chips, Cookies, Sweet Pastries, Store Bought Juices, Alcohol in Excess of 1-2 drinks a day, Artificial Sweeteners, Coffee Creamer, and Sugar-free Products. This will help patient to have stable blood glucose profile and potentially avoid unintended weight gain.   - I encouraged the patient to switch to unprocessed or minimally processed complex starch and increased protein intake (animal or plant source), fruits, and vegetables.   - Patient is advised to stick to a routine mealtimes to eat 3 meals a day and avoid unnecessary snacks (to snack only to correct hypoglycemia).  - I have approached patient with the following individualized plan to manage diabetes and patient agrees.  He is advised to continue Tresiba 20 units SQ nightly, lower Humalog  (or Novolog if he can get it from PAP) to 2-8 units TID with meals if glucose is above 90 and eating, continue Ozempic  1 mg SQ weekly, and continue his Farxiga  2.5 mg po daily (at advice of his nephrologist).   -He is advised to continue monitoring blood glucose at least twice daily using his CGM, before breakfast and before bed, and call the clinic if he gets readings less than 70 or greater than 200 for 3 tests in a row.  - Patient specific target  for A1c; LDL, HDL, Triglycerides, and  Waist Circumference were discussed in detail.  2) BP/HTN: His blood pressure is controlled to target.  He is advised to continue current medications as prescribed by  PCP/nephrology.  3) Lipids/HPL: Most recent lipid panel from 07/26/23 shows controlled LDL at 64.  He is advised to continue Crestor  10 mg po daily at bedtime.  Side effects and precautions discussed with him.  He is advised to avoid fried foods and butter.  He has labs upcoming with PCP.   4)  Weight/Diet:  His Body mass index is 28.15 kg/m.-is a candidate for modest weight loss.  CDE consult in progress, exercise, and carbohydrates information provided.  5) Chronic Care/Health Maintenance: -Patient on ACEI and Statin medications and encouraged to continue to follow up with Ophthalmology, Podiatrist at least yearly or according to recommendations, and advised to stay away from smoking. I have recommended yearly flu vaccine and pneumonia vaccination at least every 5 years; moderate intensity exercise for up to 150 minutes weekly; and  sleep for at least 7 hours a day.  I advised patient to maintain close follow up with his PCP for primary care needs.        I spent  44  minutes in the care of the patient today including review of labs from CMP, Lipids, Thyroid  Function, Hematology (current and previous including abstractions from other facilities); face-to-face time discussing  his blood glucose readings/logs, discussing hypoglycemia and hyperglycemia episodes and symptoms, medications doses, his options of short and long term treatment based on the latest standards of care / guidelines;  discussion about incorporating lifestyle medicine;  and documenting the encounter. Risk reduction counseling performed per USPSTF guidelines to reduce obesity and cardiovascular risk factors.     Please refer to Patient Instructions for Blood Glucose Monitoring and Insulin /Medications Dosing Guide  in media tab for additional information. Please  also refer to  Patient Self Inventory in the Media  tab for reviewed elements of pertinent patient history.  Travis Herrera participated in the discussions,  expressed understanding, and voiced agreement with the above plans.  All questions were answered to his satisfaction. he is encouraged to contact clinic should he have any questions or concerns prior to his return visit.     Follow up plan: Return in about 3 months (around 02/18/2025) for Diabetes F/U with A1c in office, No previsit labs, Bring meter and logs.   [name] Rio, [facility] [address] [place], [place] [address] Phone: [phone] Fax: [phone]  11/21/2024, 8:57 AM "

## 2024-11-21 NOTE — Telephone Encounter (Signed)
 I called [employer] and spoke with Lewis. I ask about patient's Tresiba. She verified that his [place] had started the shipping process on 11/17/2024. THat it would take 10 -14 business days for the process to be completed. The patient will receive a text message with it does ship. Also, she shared that his re enrollment date is 04/19/2025 and that he may submit thsi 30 days prior to this date. I have called and shared this information with both he and his wife, Inocente. [name] Rio, NP was made aware also.

## 2025-03-02 ENCOUNTER — Ambulatory Visit: Admitting: Nurse Practitioner

## 2025-04-20 ENCOUNTER — Ambulatory Visit
